# Patient Record
Sex: Female | Born: 1965
Health system: Southern US, Community
[De-identification: ages and names within clinical notes are randomized; demographics above are authoritative.]

## PROBLEM LIST (undated history)

## (undated) DIAGNOSIS — D72829 Elevated white blood cell count, unspecified: Secondary | ICD-10-CM

## (undated) DIAGNOSIS — M549 Dorsalgia, unspecified: Secondary | ICD-10-CM

## (undated) DIAGNOSIS — J45909 Unspecified asthma, uncomplicated: Secondary | ICD-10-CM

## (undated) DIAGNOSIS — F32A Depression, unspecified: Secondary | ICD-10-CM

## (undated) DIAGNOSIS — F329 Major depressive disorder, single episode, unspecified: Secondary | ICD-10-CM

## (undated) DIAGNOSIS — E039 Hypothyroidism, unspecified: Secondary | ICD-10-CM

## (undated) DIAGNOSIS — F419 Anxiety disorder, unspecified: Secondary | ICD-10-CM

## (undated) DIAGNOSIS — N879 Dysplasia of cervix uteri, unspecified: Secondary | ICD-10-CM

## (undated) DIAGNOSIS — K219 Gastro-esophageal reflux disease without esophagitis: Secondary | ICD-10-CM

## (undated) DIAGNOSIS — R87619 Unspecified abnormal cytological findings in specimens from cervix uteri: Secondary | ICD-10-CM

## (undated) HISTORY — DX: Depression, unspecified: F32.A

## (undated) HISTORY — PX: GYNECOLOGIC CRYOSURGERY: SHX857

## (undated) HISTORY — DX: Elevated white blood cell count, unspecified: D72.829

## (undated) HISTORY — DX: Unspecified abnormal cytological findings in specimens from cervix uteri: R87.619

## (undated) HISTORY — PX: COLPOSCOPY: SHX161

## (undated) HISTORY — DX: Dorsalgia, unspecified: M54.9

## (undated) HISTORY — DX: Gastro-esophageal reflux disease without esophagitis: K21.9

## (undated) HISTORY — DX: Anxiety disorder, unspecified: F41.9

## (undated) HISTORY — DX: Unspecified asthma, uncomplicated: J45.909

## (undated) HISTORY — DX: Dysplasia of cervix uteri, unspecified: N87.9

## (undated) HISTORY — DX: Hypothyroidism, unspecified: E03.9

## (undated) HISTORY — DX: Major depressive disorder, single episode, unspecified: F32.9

---

## 1999-08-05 ENCOUNTER — Other Ambulatory Visit: Admission: RE | Admit: 1999-08-05 | Discharge: 1999-08-05 | Payer: Self-pay | Admitting: *Deleted

## 2002-06-07 ENCOUNTER — Encounter: Payer: Self-pay | Admitting: Occupational Medicine

## 2002-06-07 ENCOUNTER — Encounter: Admission: RE | Admit: 2002-06-07 | Discharge: 2002-06-07 | Payer: Self-pay | Admitting: Occupational Medicine

## 2002-12-01 ENCOUNTER — Other Ambulatory Visit: Admission: RE | Admit: 2002-12-01 | Discharge: 2002-12-01 | Payer: Self-pay | Admitting: Oncology

## 2002-12-05 ENCOUNTER — Ambulatory Visit (HOSPITAL_COMMUNITY): Admission: RE | Admit: 2002-12-05 | Discharge: 2002-12-05 | Payer: Self-pay | Admitting: Oncology

## 2002-12-05 ENCOUNTER — Encounter: Payer: Self-pay | Admitting: Oncology

## 2003-11-12 ENCOUNTER — Other Ambulatory Visit: Admission: RE | Admit: 2003-11-12 | Discharge: 2003-11-12 | Payer: Self-pay | Admitting: *Deleted

## 2003-11-14 ENCOUNTER — Encounter: Admission: RE | Admit: 2003-11-14 | Discharge: 2003-11-14 | Payer: Self-pay | Admitting: Family Medicine

## 2004-07-30 ENCOUNTER — Ambulatory Visit: Payer: Self-pay | Admitting: Oncology

## 2005-10-07 ENCOUNTER — Other Ambulatory Visit: Admission: RE | Admit: 2005-10-07 | Discharge: 2005-10-07 | Payer: Self-pay | Admitting: *Deleted

## 2006-11-16 ENCOUNTER — Other Ambulatory Visit: Admission: RE | Admit: 2006-11-16 | Discharge: 2006-11-16 | Payer: Self-pay | Admitting: *Deleted

## 2008-10-03 ENCOUNTER — Other Ambulatory Visit: Admission: RE | Admit: 2008-10-03 | Discharge: 2008-10-03 | Payer: Self-pay | Admitting: Family Medicine

## 2008-10-16 ENCOUNTER — Encounter: Admission: RE | Admit: 2008-10-16 | Discharge: 2008-10-16 | Payer: Self-pay | Admitting: Family Medicine

## 2009-10-28 ENCOUNTER — Encounter: Admission: RE | Admit: 2009-10-28 | Discharge: 2009-10-28 | Payer: Self-pay | Admitting: Family Medicine

## 2009-11-26 ENCOUNTER — Other Ambulatory Visit: Admission: RE | Admit: 2009-11-26 | Discharge: 2009-11-26 | Payer: Self-pay | Admitting: Family Medicine

## 2010-12-30 ENCOUNTER — Other Ambulatory Visit: Payer: Self-pay | Admitting: Family Medicine

## 2010-12-30 DIAGNOSIS — Z1231 Encounter for screening mammogram for malignant neoplasm of breast: Secondary | ICD-10-CM

## 2011-01-05 ENCOUNTER — Ambulatory Visit
Admission: RE | Admit: 2011-01-05 | Discharge: 2011-01-05 | Disposition: A | Discharge status: Home / Self Care | Payer: BC Managed Care – PPO | Source: Ambulatory Visit | Attending: Family Medicine | Admitting: Family Medicine

## 2011-01-05 DIAGNOSIS — Z1231 Encounter for screening mammogram for malignant neoplasm of breast: Secondary | ICD-10-CM

## 2011-01-08 ENCOUNTER — Other Ambulatory Visit: Payer: Self-pay | Admitting: Women's Health

## 2011-01-08 ENCOUNTER — Other Ambulatory Visit (HOSPITAL_COMMUNITY)
Admission: RE | Admit: 2011-01-08 | Discharge: 2011-01-08 | Disposition: A | Discharge status: Home / Self Care | Payer: BC Managed Care – PPO | Source: Ambulatory Visit | Attending: Gynecology | Admitting: Gynecology

## 2011-01-08 ENCOUNTER — Ambulatory Visit (INDEPENDENT_AMBULATORY_CARE_PROVIDER_SITE_OTHER): Payer: BC Managed Care – PPO | Admitting: Women's Health

## 2011-01-08 DIAGNOSIS — R823 Hemoglobinuria: Secondary | ICD-10-CM

## 2011-01-08 DIAGNOSIS — Z01419 Encounter for gynecological examination (general) (routine) without abnormal findings: Secondary | ICD-10-CM

## 2011-01-08 DIAGNOSIS — N920 Excessive and frequent menstruation with regular cycle: Secondary | ICD-10-CM

## 2011-01-08 DIAGNOSIS — Z124 Encounter for screening for malignant neoplasm of cervix: Secondary | ICD-10-CM | POA: Insufficient documentation

## 2011-01-08 DIAGNOSIS — Z833 Family history of diabetes mellitus: Secondary | ICD-10-CM

## 2011-01-08 DIAGNOSIS — E039 Hypothyroidism, unspecified: Secondary | ICD-10-CM

## 2011-02-02 ENCOUNTER — Ambulatory Visit: Payer: BC Managed Care – PPO | Admitting: Gynecology

## 2011-02-02 ENCOUNTER — Other Ambulatory Visit: Payer: BC Managed Care – PPO

## 2011-04-16 ENCOUNTER — Encounter: Payer: Self-pay | Admitting: *Deleted

## 2011-04-17 ENCOUNTER — Ambulatory Visit (INDEPENDENT_AMBULATORY_CARE_PROVIDER_SITE_OTHER): Payer: BC Managed Care – PPO | Admitting: Women's Health

## 2011-04-17 ENCOUNTER — Encounter: Payer: Self-pay | Admitting: Women's Health

## 2011-04-17 DIAGNOSIS — E039 Hypothyroidism, unspecified: Secondary | ICD-10-CM

## 2011-04-17 DIAGNOSIS — D691 Qualitative platelet defects: Secondary | ICD-10-CM

## 2011-04-17 MED ORDER — SYNTHROID 100 MCG PO TABS: 100.0000 ug | ORAL_TABLET | Freq: Every day | ORAL | Status: DC

## 2011-04-17 NOTE — Progress Notes (Signed)
  Had annual exam in April of 2012 as a new patient. At that office visit she was on Synthroid 0.88 mg her TSH at the office this it was 7.5 to. She was increased to Synthroid 100 mcg by mouth daily she presents today for followup TSH. Has a known history of an increased WBC she is seen a hematologist and she had a negative workup for that problem she states her platelets were also slightly elevated always. Platelets at her annual exam or 460s one we'll also check a CBC today to check stability. Her periods are regular no bleeding between and much lighter since she has been on the Loestrin 1/20. We'll triage based on results of her labs.

## 2011-04-22 ENCOUNTER — Telehealth: Payer: Self-pay | Admitting: Women's Health

## 2011-04-22 DIAGNOSIS — E039 Hypothyroidism, unspecified: Secondary | ICD-10-CM

## 2011-04-22 MED ORDER — SYNTHROID 112 MCG PO TABS: 112.0000 ug | ORAL_TABLET | Freq: Every day | ORAL | Status: DC

## 2011-04-22 NOTE — Telephone Encounter (Signed)
Telephone call to patient to review TSH. TSH is 5.48 she is currently on Synthroid 100 mcg by mouth daily. Will increase to 112 mcg by mouth daily recheck in 2-1/2 months. Platelets are also 465 which is stable from her last platelet count. She has had hematology workup in the past when she had  elevated platelets and increased WBC, which is 9, was 9.4 at annual..

## 2011-07-27 ENCOUNTER — Other Ambulatory Visit: Payer: Self-pay | Admitting: Women's Health

## 2011-07-27 NOTE — Telephone Encounter (Signed)
Amy please call in Synthroid for patient and have her get TSH drawn.

## 2011-07-27 NOTE — Telephone Encounter (Signed)
Refill received... Patient was to recheck TSH, but has not come yet.

## 2011-07-28 NOTE — Telephone Encounter (Signed)
Lm on patient's vm to come in for TSH check.  Refill sent in.

## 2011-08-28 ENCOUNTER — Ambulatory Visit (INDEPENDENT_AMBULATORY_CARE_PROVIDER_SITE_OTHER): Payer: BC Managed Care – PPO | Admitting: Gynecology

## 2011-08-28 DIAGNOSIS — E039 Hypothyroidism, unspecified: Secondary | ICD-10-CM

## 2011-09-02 ENCOUNTER — Telehealth: Payer: Self-pay | Admitting: Women's Health

## 2011-09-02 DIAGNOSIS — E039 Hypothyroidism, unspecified: Secondary | ICD-10-CM

## 2011-09-02 MED ORDER — SYNTHROID 112 MCG PO TABS: 112.0000 ug | ORAL_TABLET | Freq: Every day | ORAL | Status: DC

## 2011-09-02 NOTE — Telephone Encounter (Signed)
TC informed TSH normal continue same dose 112 Snythroid

## 2011-11-09 ENCOUNTER — Other Ambulatory Visit: Payer: Self-pay | Admitting: Women's Health

## 2011-12-10 ENCOUNTER — Other Ambulatory Visit: Payer: Self-pay | Admitting: Family Medicine

## 2011-12-10 DIAGNOSIS — Z1231 Encounter for screening mammogram for malignant neoplasm of breast: Secondary | ICD-10-CM

## 2012-01-07 ENCOUNTER — Ambulatory Visit
Admission: RE | Admit: 2012-01-07 | Discharge: 2012-01-07 | Disposition: A | Discharge status: Home / Self Care | Payer: BC Managed Care – PPO | Source: Ambulatory Visit | Attending: Family Medicine | Admitting: Family Medicine

## 2012-01-07 DIAGNOSIS — Z1231 Encounter for screening mammogram for malignant neoplasm of breast: Secondary | ICD-10-CM

## 2012-01-11 ENCOUNTER — Ambulatory Visit (INDEPENDENT_AMBULATORY_CARE_PROVIDER_SITE_OTHER): Payer: BC Managed Care – PPO | Admitting: Women's Health

## 2012-01-11 ENCOUNTER — Other Ambulatory Visit (HOSPITAL_COMMUNITY)
Admission: RE | Admit: 2012-01-11 | Discharge: 2012-01-11 | Disposition: A | Discharge status: Home / Self Care | Payer: BC Managed Care – PPO | Source: Ambulatory Visit | Attending: Obstetrics and Gynecology | Admitting: Obstetrics and Gynecology

## 2012-01-11 ENCOUNTER — Other Ambulatory Visit: Payer: Self-pay | Admitting: Family Medicine

## 2012-01-11 ENCOUNTER — Encounter: Payer: Self-pay | Admitting: Women's Health

## 2012-01-11 VITALS — BP 128/76 | Ht 61.0 in | Wt 211.0 lb

## 2012-01-11 DIAGNOSIS — Z833 Family history of diabetes mellitus: Secondary | ICD-10-CM

## 2012-01-11 DIAGNOSIS — E039 Hypothyroidism, unspecified: Secondary | ICD-10-CM

## 2012-01-11 DIAGNOSIS — E669 Obesity, unspecified: Secondary | ICD-10-CM

## 2012-01-11 DIAGNOSIS — Z13220 Encounter for screening for lipoid disorders: Secondary | ICD-10-CM

## 2012-01-11 DIAGNOSIS — R928 Other abnormal and inconclusive findings on diagnostic imaging of breast: Secondary | ICD-10-CM

## 2012-01-11 DIAGNOSIS — Z309 Encounter for contraceptive management, unspecified: Secondary | ICD-10-CM

## 2012-01-11 DIAGNOSIS — IMO0001 Reserved for inherently not codable concepts without codable children: Secondary | ICD-10-CM

## 2012-01-11 DIAGNOSIS — D72829 Elevated white blood cell count, unspecified: Secondary | ICD-10-CM | POA: Insufficient documentation

## 2012-01-11 DIAGNOSIS — Z01419 Encounter for gynecological examination (general) (routine) without abnormal findings: Secondary | ICD-10-CM | POA: Insufficient documentation

## 2012-01-11 LAB — CBC WITH DIFFERENTIAL/PLATELET
Basophils Absolute: 0 K/uL (ref 0.0–0.1)
Basophils Relative: 0 % (ref 0–1)
Eosinophils Absolute: 0.2 K/uL (ref 0.0–0.7)
Eosinophils Relative: 2 % (ref 0–5)
HCT: 40.4 % (ref 36.0–46.0)
Hemoglobin: 12.7 g/dL (ref 12.0–15.0)
Lymphocytes Relative: 28 % (ref 12–46)
Lymphs Abs: 3.3 K/uL (ref 0.7–4.0)
MCH: 27.6 pg (ref 26.0–34.0)
MCHC: 31.4 g/dL (ref 30.0–36.0)
MCV: 87.8 fL (ref 78.0–100.0)
Monocytes Absolute: 0.5 K/uL (ref 0.1–1.0)
Monocytes Relative: 4 % (ref 3–12)
Neutro Abs: 7.9 K/uL — ABNORMAL HIGH (ref 1.7–7.7)
Neutrophils Relative %: 66 % (ref 43–77)
Platelets: 453 K/uL — ABNORMAL HIGH (ref 150–400)
RBC: 4.6 MIL/uL (ref 3.87–5.11)
RDW: 14.9 % (ref 11.5–15.5)
WBC: 11.9 K/uL — ABNORMAL HIGH (ref 4.0–10.5)

## 2012-01-11 LAB — LIPID PANEL
LDL Cholesterol: 127 mg/dL — ABNORMAL HIGH (ref 0–99)
Total CHOL/HDL Ratio: 4.3 Ratio

## 2012-01-11 LAB — GLUCOSE, RANDOM: Glucose, Bld: 98 mg/dL (ref 70–99)

## 2012-01-11 LAB — TSH: TSH: 4.15 u[IU]/mL (ref 0.350–4.500)

## 2012-01-11 MED ORDER — SYNTHROID 112 MCG PO TABS: 112.0000 ug | ORAL_TABLET | Freq: Every day | ORAL | Status: DC

## 2012-01-11 MED ORDER — NORETHIN ACE-ETH ESTRAD-FE 1-20 MG-MCG PO TABS: 1.0000 | ORAL_TABLET | Freq: Every day | ORAL | Status: DC

## 2012-01-11 NOTE — Patient Instructions (Signed)

## 2012-01-11 NOTE — Progress Notes (Signed)
Tina Moreno 02/15/1966 [id number]    History:    The patient presents for annual exam.  Monthly 4-5 day cycle/Loestrin 1/20/same partner. History of normal Paps per patient. Had a mammogram on 01/08/12 needs additional views, will schedule. History of anxiety and depression currently on no medications. Hypothyroid on Synthroid 112 g daily. Currently having some indigestion, heartburn, loose stools without fever, weight loss or visible blood for approximately one month. Complained of seasonal allergy symptoms.   Past medical history, past surgical history, family history and social history were all reviewed and documented in the EPIC chart. Had one son age 58 now/adopted out as a baby.  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 History of elevated  wbc's with a negative workup by hematologist.   ROS:  A  ROS was performed and pertinent positives and negatives are included in the history.  Exam:  Filed Vitals:   01/11/12 0906  BP: 128/76    General appearance:  Normal Head/Neck:  Normal, without cervical or supraclavicular adenopathy. Thyroid:  Symmetrical, normal in size, without palpable masses or nodularity. Respiratory  Effort:  Normal  Auscultation:  Clear without wheezing or rhonchi Cardiovascular  Auscultation:  Regular rate, without rubs, murmurs or gallops  Edema/varicosities:  Not grossly evident Abdominal  Soft,nontender, without masses, guarding or rebound.  Liver/spleen:  No organomegaly noted  Hernia:  None appreciated  Skin  Inspection:  Grossly normal  Palpation:  Grossly normal Neurologic/psychiatric  Orientation:  Normal with appropriate conversation.  Mood/affect:  Normal  Genitourinary    Breasts: Examined lying and sitting.     Right: Without masses, retractions, discharge or axillary adenopathy.     Left: Without masses, retractions, discharge or axillary adenopathy.   Inguinal/mons:  Normal without inguinal adenopathy  External genitalia:   Normal  BUS/Urethra/Skene's glands:  Normal  Bladder:  Normal  Vagina:  Normal  Cervix:  Normal  Uterus:   normal in size, shape and contour.  Midline and mobile  Adnexa/parametria:     Rt: Without masses or tenderness.   Lt: Without masses or tenderness.  Anus and perineum: Normal  Digital rectal exam: Normal sphincter tone without palpated masses or tenderness  Assessment/Plan:  46 y.o. SWF G1P1 for annual exam with complaint of digestive issues/change for 1 month.  GI changes-loose stools without fever, weight loss, blood in stools, or nausea/vomiting Normal GYN exam on Loestrin 1/20 Hypothyroid on 112 mcg Synthroid daily Obesity History of anxiety/depression/currently on no medications.  Plan: CBC, TSH, glucose, lipid profile, UA and Pap. SBE's, schedule followup mammogram, calcium rich diet, bland diet, no dairy products until loose stools resolve. Probiotic daily encouraged. If GI symptoms persist instructed to followup with GI doctor. Synthroid 112 mcg, prescription, proper use reviewed. Loestrin 1/20 prescription, proper use given and reviewed slight risk for blood clots and strokes. Reviewed importance of increasing exercise, decreasing calories for weight loss. Declines need for counseling at this time.   Harrington Challenger [facility], 10:27 AM 01/11/2012

## 2012-01-12 LAB — URINALYSIS W MICROSCOPIC + REFLEX CULTURE
Glucose, UA: NEGATIVE mg/dL
Leukocytes, UA: NEGATIVE
Nitrite: NEGATIVE
Protein, ur: NEGATIVE mg/dL
Urobilinogen, UA: 0.2 mg/dL (ref 0.0–1.0)

## 2012-01-13 ENCOUNTER — Other Ambulatory Visit: Payer: Self-pay | Admitting: Women's Health

## 2012-01-13 DIAGNOSIS — N390 Urinary tract infection, site not specified: Secondary | ICD-10-CM

## 2012-01-13 DIAGNOSIS — D691 Qualitative platelet defects: Secondary | ICD-10-CM

## 2012-01-13 MED ORDER — NITROFURANTOIN MONOHYD MACRO 100 MG PO CAPS: 100.0000 mg | ORAL_CAPSULE | Freq: Two times a day (BID) | ORAL | Status: AC

## 2012-01-14 ENCOUNTER — Ambulatory Visit
Admission: RE | Admit: 2012-01-14 | Discharge: 2012-01-14 | Disposition: A | Discharge status: Home / Self Care | Payer: BC Managed Care – PPO | Source: Ambulatory Visit | Attending: Family Medicine | Admitting: Family Medicine

## 2012-01-14 ENCOUNTER — Other Ambulatory Visit: Payer: Self-pay | Admitting: Women's Health

## 2012-01-14 DIAGNOSIS — R928 Other abnormal and inconclusive findings on diagnostic imaging of breast: Secondary | ICD-10-CM

## 2012-01-14 DIAGNOSIS — E039 Hypothyroidism, unspecified: Secondary | ICD-10-CM

## 2012-01-14 LAB — URINE CULTURE: Colony Count: 75000

## 2012-01-14 MED ORDER — SYNTHROID 125 MCG PO TABS: 125.0000 ug | ORAL_TABLET | Freq: Every day | ORAL | Status: DC

## 2012-02-01 ENCOUNTER — Other Ambulatory Visit: Payer: BC Managed Care – PPO | Admitting: Women's Health

## 2012-02-03 ENCOUNTER — Other Ambulatory Visit: Payer: Self-pay | Admitting: Women's Health

## 2012-02-03 DIAGNOSIS — E039 Hypothyroidism, unspecified: Secondary | ICD-10-CM

## 2012-03-16 ENCOUNTER — Other Ambulatory Visit: Payer: BC Managed Care – PPO | Admitting: Women's Health

## 2012-03-16 DIAGNOSIS — N390 Urinary tract infection, site not specified: Secondary | ICD-10-CM

## 2012-03-16 DIAGNOSIS — D691 Qualitative platelet defects: Secondary | ICD-10-CM

## 2012-03-16 DIAGNOSIS — E039 Hypothyroidism, unspecified: Secondary | ICD-10-CM

## 2012-03-16 LAB — CBC WITH DIFFERENTIAL/PLATELET
Hemoglobin: 12.3 g/dL (ref 12.0–15.0)
Lymphocytes Relative: 28 % (ref 12–46)
Lymphs Abs: 3.2 10*3/uL (ref 0.7–4.0)
Monocytes Relative: 5 % (ref 3–12)
Neutro Abs: 7.4 10*3/uL (ref 1.7–7.7)
Neutrophils Relative %: 65 % (ref 43–77)
Platelets: 441 10*3/uL — ABNORMAL HIGH (ref 150–400)
RBC: 4.52 MIL/uL (ref 3.87–5.11)
WBC: 11.5 10*3/uL — ABNORMAL HIGH (ref 4.0–10.5)

## 2012-03-16 LAB — TSH: TSH: 2.41 u[IU]/mL (ref 0.350–4.500)

## 2012-03-17 ENCOUNTER — Other Ambulatory Visit: Payer: Self-pay | Admitting: Women's Health

## 2012-03-17 DIAGNOSIS — E039 Hypothyroidism, unspecified: Secondary | ICD-10-CM

## 2012-03-17 LAB — URINALYSIS W MICROSCOPIC + REFLEX CULTURE
Casts: NONE SEEN
Glucose, UA: NEGATIVE mg/dL
Leukocytes, UA: NEGATIVE
Nitrite: NEGATIVE
pH: 5.5 (ref 5.0–8.0)

## 2012-03-17 MED ORDER — SYNTHROID 125 MCG PO TABS: 125.0000 ug | ORAL_TABLET | Freq: Every day | ORAL | Status: DC

## 2012-07-24 ENCOUNTER — Other Ambulatory Visit: Payer: Self-pay | Admitting: Women's Health

## 2012-12-13 ENCOUNTER — Other Ambulatory Visit: Payer: Self-pay

## 2012-12-13 DIAGNOSIS — Z1231 Encounter for screening mammogram for malignant neoplasm of breast: Secondary | ICD-10-CM

## 2013-01-11 ENCOUNTER — Ambulatory Visit (INDEPENDENT_AMBULATORY_CARE_PROVIDER_SITE_OTHER): Payer: BC Managed Care – PPO | Admitting: Women's Health

## 2013-01-11 ENCOUNTER — Encounter: Payer: Self-pay | Admitting: Women's Health

## 2013-01-11 ENCOUNTER — Other Ambulatory Visit (HOSPITAL_COMMUNITY)
Admission: RE | Admit: 2013-01-11 | Discharge: 2013-01-11 | Disposition: A | Discharge status: Home / Self Care | Payer: BC Managed Care – PPO | Source: Ambulatory Visit | Attending: Obstetrics and Gynecology | Admitting: Obstetrics and Gynecology

## 2013-01-11 ENCOUNTER — Ambulatory Visit
Admission: RE | Admit: 2013-01-11 | Discharge: 2013-01-11 | Disposition: A | Discharge status: Home / Self Care | Payer: BC Managed Care – PPO | Source: Ambulatory Visit

## 2013-01-11 VITALS — BP 120/78 | Ht 61.0 in | Wt 192.0 lb

## 2013-01-11 DIAGNOSIS — E039 Hypothyroidism, unspecified: Secondary | ICD-10-CM

## 2013-01-11 DIAGNOSIS — F411 Generalized anxiety disorder: Secondary | ICD-10-CM

## 2013-01-11 DIAGNOSIS — Z1231 Encounter for screening mammogram for malignant neoplasm of breast: Secondary | ICD-10-CM

## 2013-01-11 DIAGNOSIS — Z13220 Encounter for screening for lipoid disorders: Secondary | ICD-10-CM

## 2013-01-11 DIAGNOSIS — Z309 Encounter for contraceptive management, unspecified: Secondary | ICD-10-CM

## 2013-01-11 DIAGNOSIS — IMO0001 Reserved for inherently not codable concepts without codable children: Secondary | ICD-10-CM

## 2013-01-11 DIAGNOSIS — M791 Myalgia, unspecified site: Secondary | ICD-10-CM

## 2013-01-11 DIAGNOSIS — M549 Dorsalgia, unspecified: Secondary | ICD-10-CM | POA: Insufficient documentation

## 2013-01-11 DIAGNOSIS — Z01419 Encounter for gynecological examination (general) (routine) without abnormal findings: Secondary | ICD-10-CM

## 2013-01-11 DIAGNOSIS — Z833 Family history of diabetes mellitus: Secondary | ICD-10-CM

## 2013-01-11 LAB — CBC WITH DIFFERENTIAL/PLATELET
Basophils Relative: 0 % (ref 0–1)
Eosinophils Absolute: 0.2 10*3/uL (ref 0.0–0.7)
HCT: 40.7 % (ref 36.0–46.0)
Hemoglobin: 13.5 g/dL (ref 12.0–15.0)
MCH: 27.2 pg (ref 26.0–34.0)
MCHC: 33.2 g/dL (ref 30.0–36.0)
MCV: 82.1 fL (ref 78.0–100.0)
Monocytes Absolute: 0.8 10*3/uL (ref 0.1–1.0)
Monocytes Relative: 6 % (ref 3–12)
Neutro Abs: 8.5 10*3/uL — ABNORMAL HIGH (ref 1.7–7.7)

## 2013-01-11 LAB — LIPID PANEL
Cholesterol: 199 mg/dL (ref 0–200)
Total CHOL/HDL Ratio: 4.3 Ratio
VLDL: 21 mg/dL (ref 0–40)

## 2013-01-11 MED ORDER — LEVONORGESTREL-ETHINYL ESTRAD 0.1-20 MG-MCG PO TABS: 1.0000 | ORAL_TABLET | Freq: Every day | ORAL | Status: DC

## 2013-01-11 MED ORDER — CARISOPRODOL 350 MG PO TABS: 350.0000 mg | ORAL_TABLET | Freq: Three times a day (TID) | ORAL | Status: DC | PRN

## 2013-01-11 MED ORDER — SYNTHROID 125 MCG PO TABS: 125.0000 ug | ORAL_TABLET | Freq: Every day | ORAL | Status: DC

## 2013-01-11 MED ORDER — CLONAZEPAM 0.5 MG PO TABS: 0.5000 mg | ORAL_TABLET | Freq: Two times a day (BID) | ORAL | Status: DC | PRN

## 2013-01-11 NOTE — Patient Instructions (Addendum)

## 2013-01-11 NOTE — Progress Notes (Signed)
Tina Moreno 1965-12-21 [id number]    History:    The patient presents for annual exam.  Monthly cycle on Loestrin. States has had problems with birth control pills with GI symptoms of alternating diarrhea and constipation with nausea. History of cryo- for an abnormal Pap in her 19s, normal Paps for about 20 years. Normal mammograms. History of elevated WBCs with negative hematology workup. Hypothyroid on 125 Synthroid.   Past medical history, past surgical history, family history and social history were all reviewed and documented in the EPIC chart. 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 History- son who was adopted out as a Development worker, international aid.   ROS:  A  ROS was performed and pertinent positives and negatives are included in the history.  Exam:  Filed Vitals:   01/11/13 1201  BP: 120/78    General appearance:  Normal Head/Neck:  Normal, without cervical or supraclavicular adenopathy. Thyroid:  Symmetrical, normal in size, without palpable masses or nodularity. Respiratory  Effort:  Normal  Auscultation:  Clear without wheezing or rhonchi Cardiovascular  Auscultation:  Regular rate, without rubs, murmurs or gallops  Edema/varicosities:  Not grossly evident Abdominal  Soft,nontender, without masses, guarding or rebound.  Liver/spleen:  No organomegaly noted  Hernia:  None appreciated  Skin  Inspection:  Grossly normal  Palpation:  Grossly normal Neurologic/psychiatric  Orientation:  Normal with appropriate conversation.  Mood/affect:  Normal  Genitourinary    Breasts: Examined lying and sitting.     Right: Without masses, retractions, discharge or axillary adenopathy.     Left: Without masses, retractions, discharge or axillary adenopathy.   Inguinal/mons:  Normal without inguinal adenopathy  External genitalia:  Normal  BUS/Urethra/Skene's glands:  Normal  Bladder:  Normal  Vagina:  Normal  Cervix:  Normal  Uterus:  normal in size, shape and contour.  Midline and  mobile  Adnexa/parametria:     Rt: Without masses or tenderness.   Lt: Without masses or tenderness.  Anus and perineum: Normal  Digital rectal exam: Normal sphincter tone without palpated masses or tenderness  Assessment/Plan:  47 y.o. S. WF G1 P1( adopted) for annual exam.   Hypothyroid-Synthroid 125 mcg Anxiety/depression-occasional Klonopin Back pain-occasional soma Loestrin 1/20-GI complaints Obesity Cryo- normal Paps for greater than 10 years.  Plan: CBC, glucose, lipid panel, TSH, UA, Pap. SBE's, continue annual mammogram, calcium rich diet, increase exercise and decrease calories for weight loss encouraged. Contraception reviewed, will try Alesse, prescription, proper use, slight risk for blood clots, strokes, reviewed. Mirena IUD information given and reviewed slight risk forhemorrhage, infection, perforation, reviewed to be placed with cycle. Synthroid 125 mcg by mouth daily, prescription, proper use given and reviewed. Declines need for counseling at this time, states has used one prescription of Klonopin 0.5 mg #30 this past year. Refill given. Prescription for soma 350 when necessary for back pain given states has used one prescription this past year.      Harrington Challenger [facility], 12:47 PM 01/11/2013

## 2013-01-12 LAB — URINALYSIS W MICROSCOPIC + REFLEX CULTURE
Crystals: NONE SEEN
Nitrite: NEGATIVE
Specific Gravity, Urine: 1.023 (ref 1.005–1.030)
Urobilinogen, UA: 0.2 mg/dL (ref 0.0–1.0)

## 2013-07-27 ENCOUNTER — Other Ambulatory Visit: Payer: Self-pay

## 2014-01-19 ENCOUNTER — Other Ambulatory Visit: Payer: Self-pay | Admitting: Women's Health

## 2014-02-08 ENCOUNTER — Encounter: Payer: BC Managed Care – PPO | Admitting: Women's Health

## 2014-02-23 ENCOUNTER — Ambulatory Visit (INDEPENDENT_AMBULATORY_CARE_PROVIDER_SITE_OTHER): Payer: BC Managed Care – PPO | Admitting: Women's Health

## 2014-02-23 ENCOUNTER — Encounter: Payer: Self-pay | Admitting: Women's Health

## 2014-02-23 VITALS — BP 122/72 | Ht 60.5 in | Wt 207.0 lb

## 2014-02-23 DIAGNOSIS — Z309 Encounter for contraceptive management, unspecified: Secondary | ICD-10-CM

## 2014-02-23 DIAGNOSIS — IMO0001 Reserved for inherently not codable concepts without codable children: Secondary | ICD-10-CM

## 2014-02-23 DIAGNOSIS — Z13220 Encounter for screening for lipoid disorders: Secondary | ICD-10-CM

## 2014-02-23 DIAGNOSIS — E039 Hypothyroidism, unspecified: Secondary | ICD-10-CM

## 2014-02-23 DIAGNOSIS — Z01419 Encounter for gynecological examination (general) (routine) without abnormal findings: Secondary | ICD-10-CM

## 2014-02-23 LAB — COMPREHENSIVE METABOLIC PANEL
ALBUMIN: 3.8 g/dL (ref 3.5–5.2)
ALT: 10 U/L (ref 0–35)
AST: 15 U/L (ref 0–37)
Alkaline Phosphatase: 93 U/L (ref 39–117)
BUN: 13 mg/dL (ref 6–23)
CALCIUM: 8.8 mg/dL (ref 8.4–10.5)
CHLORIDE: 103 meq/L (ref 96–112)
CO2: 28 meq/L (ref 19–32)
Creat: 0.6 mg/dL (ref 0.50–1.10)
Glucose, Bld: 96 mg/dL (ref 70–99)
POTASSIUM: 4.1 meq/L (ref 3.5–5.3)
SODIUM: 137 meq/L (ref 135–145)
TOTAL PROTEIN: 6.6 g/dL (ref 6.0–8.3)
Total Bilirubin: 0.3 mg/dL (ref 0.2–1.2)

## 2014-02-23 LAB — LIPID PANEL
CHOLESTEROL: 191 mg/dL (ref 0–200)
HDL: 42 mg/dL (ref >39)
LDL Cholesterol: 127 mg/dL — ABNORMAL HIGH (ref 0–99)
Total CHOL/HDL Ratio: 4.5 Ratio
Triglycerides: 112 mg/dL (ref <150)
VLDL: 22 mg/dL (ref 0–40)

## 2014-02-23 LAB — CBC WITH DIFFERENTIAL/PLATELET
BASOS ABS: 0.1 10*3/uL (ref 0.0–0.1)
Basophils Relative: 1 % (ref 0–1)
EOS PCT: 2 % (ref 0–5)
Eosinophils Absolute: 0.2 10*3/uL (ref 0.0–0.7)
HCT: 38.8 % (ref 36.0–46.0)
Hemoglobin: 12.8 g/dL (ref 12.0–15.0)
LYMPHS ABS: 3.2 10*3/uL (ref 0.7–4.0)
LYMPHS PCT: 31 % (ref 12–46)
MCH: 26.4 pg (ref 26.0–34.0)
MCHC: 33 g/dL (ref 30.0–36.0)
MCV: 80 fL (ref 78.0–100.0)
Monocytes Absolute: 0.5 10*3/uL (ref 0.1–1.0)
Monocytes Relative: 5 % (ref 3–12)
NEUTROS ABS: 6.3 10*3/uL (ref 1.7–7.7)
NEUTROS PCT: 61 % (ref 43–77)
PLATELETS: 438 10*3/uL — AB (ref 150–400)
RBC: 4.85 MIL/uL (ref 3.87–5.11)
RDW: 15.4 % (ref 11.5–15.5)
WBC: 10.3 10*3/uL (ref 4.0–10.5)

## 2014-02-23 LAB — TSH: TSH: 3.306 u[IU]/mL (ref 0.350–4.500)

## 2014-02-23 MED ORDER — SYNTHROID 125 MCG PO TABS: ORAL_TABLET | ORAL | Status: DC

## 2014-02-23 MED ORDER — NORELGESTROMIN-ETH ESTRADIOL 150-35 MCG/24HR TD PTWK: 1.0000 | MEDICATED_PATCH | TRANSDERMAL | Status: DC

## 2014-02-23 NOTE — Progress Notes (Signed)
Tina Moreno Nov 21, 1965 [id number]    History:    Presents for annual exam.  Regular monthly heavy cycles/withdrawal/same partner. States had nausea with birth control pills. Normal mammogram history. Had cryo- in her early 46s and normal Paps after. Hypothyroid on Synthroid 125. History of elevated white blood cells with negative workup.  Past medical history, past surgical history, family history and social history were all reviewed and documented in the EPIC chart. Works at [employer]. Mother mental health problems. Father with Parkinson, diabetes, hypertension. Has 1 son who was adopted.  ROS:  A  12 point ROS was performed and pertinent positives and negatives are included.  Exam:  Filed Vitals:   02/23/14 0753  BP: 122/72    General appearance:  Normal Thyroid:  Symmetrical, normal in size, without palpable masses or nodularity. Respiratory  Auscultation:  Clear without wheezing or rhonchi Cardiovascular  Auscultation:  Regular rate, without rubs, murmurs or gallops  Edema/varicosities:  Not grossly evident Abdominal  Soft,nontender, without masses, guarding or rebound.  Liver/spleen:  No organomegaly noted  Hernia:  None appreciated  Skin  Inspection:  Grossly normal   Breasts: Examined lying and sitting.     Right: Without masses, retractions, discharge or axillary adenopathy.     Left: Without masses, retractions, discharge or axillary adenopathy. Gentitourinary   Inguinal/mons:  Normal without inguinal adenopathy  External genitalia:  Normal  BUS/Urethra/Skene's glands:  Normal  Vagina:  Normal  Cervix:  Normal  Uterus:   normal in size, shape and contour.  Midline and mobile  Adnexa/parametria:     Rt: Without masses or tenderness.   Lt: Without masses or tenderness.  Anus and perineum: Normal  Digital rectal exam: Normal sphincter tone without palpated masses or tenderness  Assessment/Plan:  48 y.o.SWF G1P1 (adopted)  for annual exam.    Normal  GYN exam with menorrhagia Hypothyroid Obesity  Plan: Contraception options reviewed, Ortho Evra patch, one patch weekly for 3 weeks, reviewed risk for blood clots and strokes, will start with next cycle, condoms or withdrawal until after  first month. SBE's, annual mammogram overdue instructed to schedule, calcium rich diet, vitamin D 1000 daily encouraged. Reviewed importance of increasing regular exercise and decreasing calories for weight loss, Synthroid 125 mcg by mouth daily proper use, proper administration reviewed, TSH pending. CBC, comprehensive metabolic panel, lipid panel, UA, Pap normal 2014, new screening guidelines reviewed.   Note: This dictation was prepared with Dragon/digital dictation.  Any transcriptional errors that result are unintentional. Huel Cote [facility], 8:20 AM 02/23/2014

## 2014-02-23 NOTE — Patient Instructions (Signed)

## 2014-02-24 LAB — URINALYSIS W MICROSCOPIC + REFLEX CULTURE
Bilirubin Urine: NEGATIVE
Casts: NONE SEEN
Crystals: NONE SEEN
GLUCOSE, UA: NEGATIVE mg/dL
HGB URINE DIPSTICK: NEGATIVE
KETONES UR: NEGATIVE mg/dL
LEUKOCYTES UA: NEGATIVE
Nitrite: NEGATIVE
PROTEIN: NEGATIVE mg/dL
Specific Gravity, Urine: 1.021 (ref 1.005–1.030)
Urobilinogen, UA: 0.2 mg/dL (ref 0.0–1.0)
pH: 6 (ref 5.0–8.0)

## 2014-02-25 LAB — URINE CULTURE

## 2014-02-26 ENCOUNTER — Encounter: Payer: Self-pay | Admitting: Women's Health

## 2014-05-04 ENCOUNTER — Encounter: Payer: Self-pay | Admitting: Women's Health

## 2014-05-04 ENCOUNTER — Ambulatory Visit (INDEPENDENT_AMBULATORY_CARE_PROVIDER_SITE_OTHER): Payer: BC Managed Care – PPO | Admitting: Women's Health

## 2014-05-04 DIAGNOSIS — N760 Acute vaginitis: Secondary | ICD-10-CM

## 2014-05-04 DIAGNOSIS — A499 Bacterial infection, unspecified: Secondary | ICD-10-CM

## 2014-05-04 DIAGNOSIS — B373 Candidiasis of vulva and vagina: Secondary | ICD-10-CM

## 2014-05-04 DIAGNOSIS — N898 Other specified noninflammatory disorders of vagina: Secondary | ICD-10-CM

## 2014-05-04 DIAGNOSIS — L293 Anogenital pruritus, unspecified: Secondary | ICD-10-CM

## 2014-05-04 DIAGNOSIS — B9689 Other specified bacterial agents as the cause of diseases classified elsewhere: Secondary | ICD-10-CM

## 2014-05-04 DIAGNOSIS — B3731 Acute candidiasis of vulva and vagina: Secondary | ICD-10-CM

## 2014-05-04 LAB — WET PREP FOR TRICH, YEAST, CLUE: Trich, Wet Prep: NONE SEEN

## 2014-05-04 MED ORDER — METRONIDAZOLE 0.75 % VA GEL: 1.0000 | Freq: Two times a day (BID) | VAGINAL | Status: DC

## 2014-05-04 MED ORDER — FLUCONAZOLE 150 MG PO TABS: ORAL_TABLET | ORAL | Status: DC

## 2014-05-04 NOTE — Patient Instructions (Signed)
Bacterial Vaginosis Bacterial vaginosis is an infection of the vagina. It happens when too many of certain germs (bacteria) grow in the vagina. HOME CARE  Take your medicine as told by your doctor.  Finish your medicine even if you start to feel better.  Do not have sex until you finish your medicine and are better.  Tell your sex partner that you have an infection. They should see their doctor for treatment.  Practice safe sex. Use condoms. Have only one sex partner. GET HELP IF:  You are not getting better after 3 days of treatment.  You have more grey fluid (discharge) coming from your vagina than before.  You have more pain than before.  You have a fever. MAKE SURE YOU:   Understand these instructions.  Will watch your condition.  Will get help right away if you are not doing well or get worse. Document Released: 06/16/2008 Document Revised: 06/28/2013 Document Reviewed: 04/19/2013 ExitCare Patient Information 2015 [employer]. This information is not intended to replace advice given to you by your health care provider. Make sure you discuss any questions you have with your health care provider. Monilial Vaginitis Vaginitis in a soreness, swelling and redness (inflammation) of the vagina and vulva. Monilial vaginitis is not a sexually transmitted infection. CAUSES  Yeast vaginitis is caused by yeast (candida) that is normally found in your vagina. With a yeast infection, the candida has overgrown in number to a point that upsets the chemical balance. SYMPTOMS   White, thick vaginal discharge.  Swelling, itching, redness and irritation of the vagina and possibly the lips of the vagina (vulva).  Burning or painful urination.  Painful intercourse. DIAGNOSIS  Things that may contribute to monilial vaginitis are:  Postmenopausal and virginal states.  Pregnancy.  Infections.  Being tired, sick or stressed, especially if you had monilial vaginitis in the  past.  Diabetes. Good control will help lower the chance.  Birth control pills.  Tight fitting garments.  Using bubble bath, feminine sprays, douches or deodorant tampons.  Taking certain medications that kill germs (antibiotics).  Sporadic recurrence can occur if you become ill. TREATMENT  Your caregiver will give you medication.  There are several kinds of anti monilial vaginal creams and suppositories specific for monilial vaginitis. For recurrent yeast infections, use a suppository or cream in the vagina 2 times a week, or as directed.  Anti-monilial or steroid cream for the itching or irritation of the vulva may also be used. Get your caregiver's permission.  Painting the vagina with methylene blue solution may help if the monilial cream does not work.  Eating yogurt may help prevent monilial vaginitis. HOME CARE INSTRUCTIONS   Finish all medication as prescribed.  Do not have sex until treatment is completed or after your caregiver tells you it is okay.  Take warm sitz baths.  Do not douche.  Do not use tampons, especially scented ones.  Wear cotton underwear.  Avoid tight pants and panty hose.  Tell your sexual partner that you have a yeast infection. They should go to their caregiver if they have symptoms such as mild rash or itching.  Your sexual partner should be treated as well if your infection is difficult to eliminate.  Practice safer sex. Use condoms.  Some vaginal medications cause latex condoms to fail. Vaginal medications that harm condoms are:  Cleocin cream.  Butoconazole (Femstat).  Terconazole (Terazol) vaginal suppository.  Miconazole (Monistat) (may be purchased over the counter). SEEK MEDICAL CARE IF:     You have a temperature by mouth above 102 F (38.9 C).  The infection is getting worse after 2 days of treatment.  The infection is not getting better after 3 days of treatment.  You develop blisters in or around your  vagina.  You develop vaginal bleeding, and it is not your menstrual period.  You have pain when you urinate.  You develop intestinal problems.  You have pain with sexual intercourse. Document Released: 06/17/2005 Document Revised: 11/30/2011 Document Reviewed: 03/01/2009 ExitCare Patient Information 2015 [employer]. This information is not intended to replace advice given to you by your health care provider. Make sure you discuss any questions you have with your health care provider.  

## 2014-05-04 NOTE — Progress Notes (Signed)
Patient ID: Tina Moreno, female   DOB: 07/14/1966, 48 y.o.   MRN: [id number] Presents with complaint of intense vaginal itching with irritation for 1 week.  Denies any urinary symptoms, abdominal pain or fever. Same partner, monthly cycle on Ortho Evra patch.   Exam: Appears well. External genitalia extremely erythematous, speculum exam scant white discharge with odor noted, wet prep positive for yeast, clues, and TNTC bacteria.  Yeast vaginitis BV  Plan: MetroGel vaginal cream 1 applicator at bedtime x5, alcohol precautions reviewed, Diflucan 1:50 PM today and repeat in 3 days prescription, proper use given and reviewed. Instructed to call if no relief. Yeast prevention discussed.

## 2014-07-06 ENCOUNTER — Other Ambulatory Visit: Payer: Self-pay

## 2014-07-23 ENCOUNTER — Encounter: Payer: Self-pay | Admitting: Women's Health

## 2014-08-11 ENCOUNTER — Other Ambulatory Visit: Payer: Self-pay | Admitting: Women's Health

## 2014-08-13 MED ORDER — CLONAZEPAM 0.5 MG PO TABS: 0.5000 mg | ORAL_TABLET | Freq: Two times a day (BID) | ORAL | Status: DC | PRN

## 2014-08-13 NOTE — Telephone Encounter (Signed)
Tina Moreno please see the below note.

## 2014-08-13 NOTE — Telephone Encounter (Signed)
Rx called in 

## 2015-03-07 ENCOUNTER — Other Ambulatory Visit: Payer: Self-pay | Admitting: Women's Health

## 2015-05-10 ENCOUNTER — Other Ambulatory Visit: Payer: Self-pay | Admitting: Women's Health

## 2015-10-30 ENCOUNTER — Encounter: Payer: Self-pay | Admitting: Obstetrics and Gynecology

## 2015-12-11 ENCOUNTER — Other Ambulatory Visit: Payer: Self-pay | Admitting: Obstetrics and Gynecology

## 2015-12-11 ENCOUNTER — Encounter: Payer: Self-pay | Admitting: Obstetrics and Gynecology

## 2015-12-11 ENCOUNTER — Ambulatory Visit (INDEPENDENT_AMBULATORY_CARE_PROVIDER_SITE_OTHER): Payer: BLUE CROSS/BLUE SHIELD | Admitting: Obstetrics and Gynecology

## 2015-12-11 VITALS — BP 106/78 | HR 88 | Ht 61.0 in | Wt 209.5 lb

## 2015-12-11 DIAGNOSIS — N920 Excessive and frequent menstruation with regular cycle: Secondary | ICD-10-CM

## 2015-12-11 DIAGNOSIS — E039 Hypothyroidism, unspecified: Secondary | ICD-10-CM | POA: Diagnosis not present

## 2015-12-11 DIAGNOSIS — Z01419 Encounter for gynecological examination (general) (routine) without abnormal findings: Secondary | ICD-10-CM

## 2015-12-11 NOTE — Progress Notes (Signed)
Subjective:   KATRI CREASMAN is a 50 y.o. G74P1001 Caucasian female here for a routine well-woman exam.  Patient's last menstrual period was 11/25/2015.    Current complaints: heavy menses monthly. PCP: needs new one       Does need labs  Social History: Sexual: heterosexual Marital Status: single Living situation: alone Occupation: data entry Tobacco/alcohol: no tobacco use Illicit drugs: no history of illicit drug use  The following portions of the patient's history were reviewed and updated as appropriate: allergies, current medications, past family history, past medical history, past social history, past surgical history and problem list.  Past Medical History Past Medical History  Diagnosis Date  . Elevated WBCs     NEGATIVE HEM W/U  . Back pain   . Cervical dysplasia   . Hypothyroid   . Anxiety   . Depression   . Abnormal Pap smear of cervix     Past Surgical History Past Surgical History  Procedure Laterality Date  . Colposcopy    . Gynecologic cryosurgery      Gynecologic History G1P1001  Patient's last menstrual period was 11/25/2015. Contraception: abstinence Last Pap: 2014. Results were: normal Last mammogram: 2015. Results were: normal                                                                                   Obstetric History OB History  Gravida Para Term Preterm AB SAB TAB Ectopic Multiple Living  1 1 1       1     # Outcome Date GA Lbr Len/2nd Weight Sex Delivery Anes PTL Lv  1 Term               Current Medications Current Outpatient Prescriptions on File Prior to Visit  Medication Sig Dispense Refill  . ALPRAZolam (XANAX) 0.5 MG tablet Take 0.5 mg by mouth as needed.      . carisoprodol (SOMA) 350 MG tablet Take 1 tablet (350 mg total) by mouth 3 (three) times daily as needed. 30 tablet 0  . clonazePAM (KLONOPIN) 0.5 MG tablet Take 1 tablet (0.5 mg total) by mouth 2 (two) times daily as needed. 30 tablet 0  . ibuprofen (ADVIL,MOTRIN)  200 MG tablet Take 200 mg by mouth every 6 (six) hours as needed.      . fluconazole (DIFLUCAN) 150 MG tablet Take one today and repeat in 3 days (Patient not taking: Reported on 12/11/2015) 2 tablet 1  . metroNIDAZOLE (METROGEL) 0.75 % vaginal gel Place 1 Applicatorful vaginally 2 (two) times daily. (Patient not taking: Reported on 12/11/2015) 70 g 0  . SYNTHROID 125 MCG tablet take 1 tablet by mouth once daily BRFORE BREAKFAST (Patient not taking: Reported on 12/11/2015) 30 tablet 0  . XULANE 150-35 MCG/24HR transdermal patch apply 1 patch and replace weekly for 3 weeks (Patient not taking: Reported on 12/11/2015) 1 Package 0   No current facility-administered medications on file prior to visit.    Review of Systems Patient denies any headaches, blurred vision, shortness of breath, chest pain, abdominal pain, problems with bowel movements, urination, or intercourse.  Objective:  BP 106/78 mmHg  Pulse 88  Ht 5\' 1"  (1.549  m)  Wt 209 lb 8 oz (95.029 kg)  BMI 39.61 kg/m2  LMP 11/25/2015 Physical Exam  General:  Well developed, well nourished, no acute distress. She is alert and oriented x3. Skin:  Warm and dry Neck:  Midline trachea, no thyromegaly or nodules Cardiovascular: Regular rate and rhythm, no murmur heard Lungs:  Effort normal, all lung fields clear to auscultation bilaterally Breasts:  No dominant palpable mass, retraction, or nipple discharge Abdomen:  Soft, non tender, no hepatosplenomegaly or masses Pelvic:  External genitalia is normal in appearance.  The vagina is normal in appearance. The cervix is bulbous, no CMT.  Thin prep pap is done with HR HPV cotesting. Uterus is felt to be normal size, shape, and contour.  No adnexal masses or tenderness noted. Extremities:  No swelling or varicosities noted Psych:  She has a normal mood and affect  Assessment:   Healthy well-woman exam Menorrhagia Obesity depression hypothyroidism  Plan:  Pap and labs obtained Referred to  PCP to establish care. F/U 1 year for AE, or sooner if needed Mammogram scheduled  Joanann Mies [place], CNM

## 2015-12-11 NOTE — Patient Instructions (Signed)
Place annual gynecologic exam patient instructions here.

## 2015-12-12 LAB — CYTOLOGY - PAP

## 2015-12-13 ENCOUNTER — Other Ambulatory Visit: Payer: Self-pay | Admitting: Obstetrics and Gynecology

## 2015-12-13 ENCOUNTER — Telehealth: Payer: Self-pay | Admitting: *Deleted

## 2015-12-13 DIAGNOSIS — R7303 Prediabetes: Secondary | ICD-10-CM

## 2015-12-13 DIAGNOSIS — E119 Type 2 diabetes mellitus without complications: Secondary | ICD-10-CM | POA: Insufficient documentation

## 2015-12-13 DIAGNOSIS — R7989 Other specified abnormal findings of blood chemistry: Secondary | ICD-10-CM

## 2015-12-13 DIAGNOSIS — E7800 Pure hypercholesterolemia, unspecified: Secondary | ICD-10-CM | POA: Insufficient documentation

## 2015-12-13 DIAGNOSIS — E559 Vitamin D deficiency, unspecified: Secondary | ICD-10-CM | POA: Insufficient documentation

## 2015-12-13 MED ORDER — VITAMIN D (ERGOCALCIFEROL) 1.25 MG (50000 UNIT) PO CAPS: 50000.0000 [IU] | ORAL_CAPSULE | ORAL | Status: DC

## 2015-12-13 MED ORDER — SYNTHROID 125 MCG PO TABS: 125.0000 ug | ORAL_TABLET | Freq: Every day | ORAL | Status: DC

## 2015-12-13 MED ORDER — NORELGESTROMIN-ETH ESTRADIOL 150-35 MCG/24HR TD PTWK: MEDICATED_PATCH | TRANSDERMAL | Status: DC

## 2015-12-13 NOTE — Telephone Encounter (Signed)
-----   Message from Joylene Igo, [place] sent at 12/13/2015 12:18 PM EDT ----- Please mail info on pre-diabetes, cholesterol and vit d def. Also see where she want s scripts mailed too as she mentioned changing pharmacies.

## 2015-12-13 NOTE — Telephone Encounter (Signed)
Notified pt of results and mailed all info to pt

## 2015-12-19 LAB — COMPREHENSIVE METABOLIC PANEL
A/G RATIO: 1.8 (ref 1.2–2.2)
ALT: 16 IU/L (ref 0–32)
AST: 23 IU/L (ref 0–40)
Albumin: 4.7 g/dL (ref 3.5–5.5)
Alkaline Phosphatase: 109 IU/L (ref 39–117)
BUN/Creatinine Ratio: 14 (ref 9–23)
BUN: 9 mg/dL (ref 6–24)
Bilirubin Total: 0.2 mg/dL (ref 0.0–1.2)
CALCIUM: 9.6 mg/dL (ref 8.7–10.2)
CO2: 23 mmol/L (ref 18–29)
CREATININE: 0.65 mg/dL (ref 0.57–1.00)
Chloride: 102 mmol/L (ref 96–106)
GFR, EST AFRICAN AMERICAN: 122 mL/min/{1.73_m2} (ref >59)
GFR, EST NON AFRICAN AMERICAN: 106 mL/min/{1.73_m2} (ref >59)
Globulin, Total: 2.6 g/dL (ref 1.5–4.5)
Glucose: 111 mg/dL — ABNORMAL HIGH (ref 65–99)
POTASSIUM: 4.2 mmol/L (ref 3.5–5.2)
Sodium: 144 mmol/L (ref 134–144)
TOTAL PROTEIN: 7.3 g/dL (ref 6.0–8.5)

## 2015-12-19 LAB — CBC
HEMATOCRIT: 40 % (ref 34.0–46.6)
Hemoglobin: 13 g/dL (ref 11.1–15.9)
MCH: 26.2 pg — ABNORMAL LOW (ref 26.6–33.0)
MCHC: 32.5 g/dL (ref 31.5–35.7)
MCV: 81 fL (ref 79–97)
PLATELETS: 459 10*3/uL — AB (ref 150–379)
RBC: 4.96 x10E6/uL (ref 3.77–5.28)
RDW: 16.2 % — ABNORMAL HIGH (ref 12.3–15.4)
WBC: 12.1 10*3/uL — AB (ref 3.4–10.8)

## 2015-12-19 LAB — IRON: Iron: 34 ug/dL (ref 27–159)

## 2015-12-19 LAB — LIPID PANEL
CHOL/HDL RATIO: 4.2 ratio (ref 0.0–4.4)
Cholesterol, Total: 182 mg/dL (ref 100–199)
HDL: 43 mg/dL (ref >39)
LDL CALC: 110 mg/dL — AB (ref 0–99)
Triglycerides: 147 mg/dL (ref 0–149)
VLDL CHOLESTEROL CAL: 29 mg/dL (ref 5–40)

## 2015-12-19 LAB — THYROID PANEL WITH TSH
Free Thyroxine Index: 1.2 (ref 1.2–4.9)
T3 Uptake Ratio: 22 % — ABNORMAL LOW (ref 24–39)
T4 TOTAL: 5.4 ug/dL (ref 4.5–12.0)
TSH: 34.04 u[IU]/mL — ABNORMAL HIGH (ref 0.450–4.500)

## 2015-12-19 LAB — VITAMIN D 25 HYDROXY (VIT D DEFICIENCY, FRACTURES): VIT D 25 HYDROXY: 28.1 ng/mL — AB (ref 30.0–100.0)

## 2015-12-19 LAB — FOLATE: Folate: 17.9 ng/mL (ref >3.0)

## 2015-12-19 LAB — HEMOGLOBIN A1C
Est. average glucose Bld gHb Est-mCnc: 117 mg/dL
HEMOGLOBIN A1C: 5.7 % — AB (ref 4.8–5.6)

## 2015-12-23 ENCOUNTER — Encounter: Payer: Self-pay | Admitting: Family Medicine

## 2015-12-23 ENCOUNTER — Ambulatory Visit (INDEPENDENT_AMBULATORY_CARE_PROVIDER_SITE_OTHER): Payer: BLUE CROSS/BLUE SHIELD | Admitting: Family Medicine

## 2015-12-23 VITALS — BP 126/78 | HR 72 | Temp 98.4°F | Ht 61.0 in | Wt 208.2 lb

## 2015-12-23 DIAGNOSIS — M549 Dorsalgia, unspecified: Secondary | ICD-10-CM | POA: Diagnosis not present

## 2015-12-23 DIAGNOSIS — M25529 Pain in unspecified elbow: Secondary | ICD-10-CM | POA: Diagnosis not present

## 2015-12-23 DIAGNOSIS — M25522 Pain in left elbow: Secondary | ICD-10-CM

## 2015-12-23 DIAGNOSIS — M791 Myalgia, unspecified site: Secondary | ICD-10-CM

## 2015-12-23 DIAGNOSIS — M25521 Pain in right elbow: Secondary | ICD-10-CM | POA: Insufficient documentation

## 2015-12-23 MED ORDER — CARISOPRODOL 350 MG PO TABS: 350.0000 mg | ORAL_TABLET | Freq: Three times a day (TID) | ORAL | Status: DC | PRN

## 2015-12-23 NOTE — Assessment & Plan Note (Signed)
Suspect lateral epicondylitis as cause given location. No discomfort on exam today. Advised on icing. Can use elbow brace that she has at home as this has helped before. Also advised she could pick up a tennis elbow brace at the pharmacy. She'll continue to monitor.

## 2015-12-23 NOTE — Progress Notes (Signed)
Patient ID: Tina Moreno, female   DOB: [birth year], 50 y.o.   MRN: [id number]  Tommi Rumps, MD Phone: [phone]  [name] is a 50 y.o. female who presents today for new patient visit.  Bilateral elbow pain: Left greater than right. Notes his been hurting for the last several weeks. Is just an achy sensation. It hurts more when picking stuff up. Location is in the lateral epicondyles area. No numbness or weakness. Had a similar issue last year and had some swelling at that time. No injury. She used an elbow and that helped with this.  Back pain: Patient notes intermittent chronic history of back pain that is described as her muscles being tight. No radiation to her legs. No numbness or weakness. Has had x-rays that were benign per her report. She's been using Soma for her muscle discomfort. Notes she does not take this often. She does not take this at the same time as her Xanax or Klonopin.  She also reports she needs a vision check for a physical form. She notes her primary midwife filled out most of the form though was unable to do the revision portion. Vision portion was reviewed and required vision screening, color vision testing, and peripheral vision testing.:  Active Ambulatory Problems    Diagnosis Date Noted  . Hypothyroid 01/11/2012  . Elevated WBCs 01/11/2012  . Obesity 01/11/2012  . Back pain   . Pre-diabetes 12/13/2015  . Vitamin D deficiency 12/13/2015  . Elevated LDL cholesterol level 12/13/2015  . Bilateral elbow joint pain 12/23/2015   Resolved Ambulatory Problems    Diagnosis Date Noted  . No Resolved Ambulatory Problems   Past Medical History  Diagnosis Date  . Cervical dysplasia   . Anxiety   . Depression   . Abnormal Pap smear of cervix   . GERD (gastroesophageal reflux disease)     Family History  Problem Relation Age of Onset  . Parkinsonism Father   . Diabetes Father   . Hypertension Father   . Cancer Maternal Aunt     SOFT CELL  SARCOMA  . Cancer Maternal Uncle     TESTICULAR  . Cancer Paternal Grandmother     COLON  . Cancer Son     TESTICULAR    Social History   Social History  . Marital Status: Single    Spouse Name: N/A  . Number of Children: N/A  . Years of Education: N/A   Occupational History  . Not on file.   Social History Main Topics  . Smoking status: Never Smoker   . Smokeless tobacco: Never Used  . Alcohol Use: Yes     Comment: OCCASSIONALLY  . Drug Use: No  . Sexual Activity: Not Currently   Other Topics Concern  . Not on file   Social History Narrative    ROS  General:  Negative for nexplained weight loss, fever Skin: Negative for new or changing mole, sore that won't heal HEENT: Negative for trouble hearing, trouble seeing, ringing in ears, mouth sores, hoarseness, change in voice, dysphagia. CV:  Negative for chest pain, dyspnea, edema, palpitations Resp: Negative for cough, dyspnea, hemoptysis GI: Negative for nausea, vomiting, diarrhea, constipation, abdominal pain, melena, hematochezia. GU: Negative for dysuria, incontinence, urinary hesitance, hematuria, vaginal or penile discharge, polyuria, sexual difficulty, lumps in testicle or breasts MSK: Negative for muscle cramps or aches, positive for joint pain or swelling Neuro: Negative for headaches, weakness, numbness, dizziness, passing out/fainting Psych: Negative for depression, anxiety, memory  problems  Objective  Physical Exam Filed Vitals:   12/23/15 1514  BP: 126/78  Pulse: 72  Temp: 98.4 F (36.9 C)    BP Readings from Last 3 Encounters:  12/23/15 126/78  12/11/15 106/78  02/23/14 122/72   Wt Readings from Last 3 Encounters:  12/23/15 208 lb 3.2 oz (94.439 kg)  12/11/15 209 lb 8 oz (95.029 kg)  02/23/14 207 lb (93.895 kg)    Physical Exam  Constitutional: She is well-developed, well-nourished, and in no distress.  HENT:  Head: Normocephalic and atraumatic.  Right Ear: External ear normal.    Left Ear: External ear normal.  Mouth/Throat: Oropharynx is clear and moist. No oropharyngeal exudate.  Eyes: Conjunctivae are normal. Pupils are equal, round, and reactive to light.  Neck: Neck supple.  Cardiovascular: Normal rate, regular rhythm and normal heart sounds.  Exam reveals no gallop and no friction rub.   No murmur heard. Pulmonary/Chest: Effort normal and breath sounds normal. No respiratory distress. She has no wheezes. She has no rales.  Abdominal: Soft. Bowel sounds are normal. She exhibits no distension. There is no tenderness. There is no rebound and no guarding.  Musculoskeletal: She exhibits no edema.  Bilateral elbows no swelling, warmth, or erythema, no tenderness to palpation of any aspect of the bilateral elbows No midline spine tenderness, no midline spine step-off, there is no muscular back tenderness, there is spasm noted in the bilateral rhomboids  Lymphadenopathy:    She has no cervical adenopathy.  Neurological: She is alert. Gait normal.  5/5 strength in bilateral biceps, triceps, grip, quads, hamstrings, plantar and dorsiflexion, sensation to light touch intact in bilateral UE and LE, normal gait, 2+ patellar reflexes  Skin: Skin is warm and dry. She is not diaphoretic.  Psychiatric: Mood and affect normal.     Assessment/Plan:   Back pain Chronic intermittent issue. Managed with Soma. Neurologically intact in her upper and lower extremities. Some spasm on exam but no discomfort at this time. We'll refill Soma. Advised not take this at the same time as her Klonopin or Xanax. Advised this medication could make her drowsy. Given return precautions.  Bilateral elbow joint pain Suspect lateral epicondylitis as cause given location. No discomfort on exam today. Advised on icing. Can use elbow brace that she has at home as this has helped before. Also advised she could pick up a tennis elbow brace at the pharmacy. She'll continue to monitor.  Patient will  check with the organization who needs the form filled out to see what what extent of vision screening she needs as we do not do color vision screening and peripheral vision screening at this office.  No orders of the defined types were placed in this encounter.    Meds ordered this encounter  Medications  . carisoprodol (SOMA) 350 MG tablet    Sig: Take 1 tablet (350 mg total) by mouth 3 (three) times daily as needed.    Dispense:  30 tablet    Refill:  0     Tommi Rumps, MD [place]

## 2015-12-23 NOTE — Progress Notes (Signed)
Pre visit review using our clinic review tool, if applicable. No additional management support is needed unless otherwise documented below in the visit note. 

## 2015-12-23 NOTE — Patient Instructions (Signed)
Nice to meet you. I refilled her Soma. Do not take this at the same time as the Klonopin or Xanax. Please try the brace you have at home for your elbows and if this does not work you can buy a tennis elbow brace over-the-counter at the pharmacy. Please check with the people that needs a form filled out regarding the vision section and if you need all parts of vision section completed. If you develop numbness or weakness, bowel or bladder incontinence, numbness between your legs, fever, worsening pain, or any new or changing symptoms please seek medical attention.

## 2015-12-23 NOTE — Assessment & Plan Note (Addendum)
Chronic intermittent issue. Managed with Soma. Neurologically intact in her upper and lower extremities. Some spasm on exam but no discomfort at this time. We'll refill Soma. Advised not take this at the same time as her Klonopin or Xanax. Advised this medication could make her drowsy. Given return precautions.

## 2016-02-05 ENCOUNTER — Telehealth: Payer: Self-pay | Admitting: Obstetrics and Gynecology

## 2016-02-05 NOTE — Telephone Encounter (Signed)
Ms. Brownback called asking if she can come in tomorrow morning to have labs drawn. There's no order in for her but she said when she had her labs drawn in March, she was told to have a one month follow-up appointment. She'd like a phone call to make sure she can come in.  Pt's ph# [phone] Thank you.

## 2016-02-06 ENCOUNTER — Other Ambulatory Visit: Payer: BLUE CROSS/BLUE SHIELD

## 2016-02-06 DIAGNOSIS — R7989 Other specified abnormal findings of blood chemistry: Secondary | ICD-10-CM

## 2016-02-06 DIAGNOSIS — Z01419 Encounter for gynecological examination (general) (routine) without abnormal findings: Secondary | ICD-10-CM

## 2016-02-06 DIAGNOSIS — R946 Abnormal results of thyroid function studies: Secondary | ICD-10-CM | POA: Diagnosis not present

## 2016-02-06 NOTE — Telephone Encounter (Signed)
Labs were done that needed to be done

## 2016-02-07 LAB — THYROID PANEL WITH TSH
Free Thyroxine Index: 2.5 (ref 1.2–4.9)
T3 Uptake Ratio: 18 % — ABNORMAL LOW (ref 24–39)
T4 TOTAL: 13.7 ug/dL — AB (ref 4.5–12.0)
TSH: 6.88 u[IU]/mL — ABNORMAL HIGH (ref 0.450–4.500)

## 2016-03-09 DIAGNOSIS — F331 Major depressive disorder, recurrent, moderate: Secondary | ICD-10-CM | POA: Diagnosis not present

## 2016-03-12 ENCOUNTER — Other Ambulatory Visit: Payer: BLUE CROSS/BLUE SHIELD

## 2016-03-12 ENCOUNTER — Other Ambulatory Visit: Payer: Self-pay | Admitting: *Deleted

## 2016-03-12 DIAGNOSIS — R946 Abnormal results of thyroid function studies: Secondary | ICD-10-CM

## 2016-03-13 DIAGNOSIS — R946 Abnormal results of thyroid function studies: Secondary | ICD-10-CM | POA: Diagnosis not present

## 2016-03-14 LAB — THYROID PANEL WITH TSH
FREE THYROXINE INDEX: 2.5 (ref 1.2–4.9)
T3 UPTAKE RATIO: 20 % — AB (ref 24–39)
T4 TOTAL: 12.7 ug/dL — AB (ref 4.5–12.0)
TSH: 5.05 u[IU]/mL — ABNORMAL HIGH (ref 0.450–4.500)

## 2016-03-16 ENCOUNTER — Encounter: Payer: Self-pay | Admitting: Obstetrics and Gynecology

## 2016-03-30 ENCOUNTER — Encounter: Payer: Self-pay | Admitting: Family Medicine

## 2016-03-30 ENCOUNTER — Ambulatory Visit (INDEPENDENT_AMBULATORY_CARE_PROVIDER_SITE_OTHER): Payer: BLUE CROSS/BLUE SHIELD | Admitting: Family Medicine

## 2016-03-30 VITALS — BP 118/68 | HR 65 | Temp 98.4°F | Ht 61.0 in | Wt 189.6 lb

## 2016-03-30 DIAGNOSIS — J45909 Unspecified asthma, uncomplicated: Secondary | ICD-10-CM | POA: Insufficient documentation

## 2016-03-30 DIAGNOSIS — R7303 Prediabetes: Secondary | ICD-10-CM

## 2016-03-30 DIAGNOSIS — E7800 Pure hypercholesterolemia, unspecified: Secondary | ICD-10-CM | POA: Diagnosis not present

## 2016-03-30 DIAGNOSIS — J4520 Mild intermittent asthma, uncomplicated: Secondary | ICD-10-CM | POA: Diagnosis not present

## 2016-03-30 DIAGNOSIS — E039 Hypothyroidism, unspecified: Secondary | ICD-10-CM | POA: Diagnosis not present

## 2016-03-30 LAB — HEMOGLOBIN A1C: Hgb A1c MFr Bld: 5.8 % (ref 4.6–6.5)

## 2016-03-30 MED ORDER — ALBUTEROL SULFATE HFA 108 (90 BASE) MCG/ACT IN AERS: 2.0000 | INHALATION_SPRAY | Freq: Four times a day (QID) | RESPIRATORY_TRACT | Status: DC | PRN

## 2016-03-30 MED ORDER — SYNTHROID 125 MCG PO TABS: 125.0000 ug | ORAL_TABLET | Freq: Every day | ORAL | Status: DC

## 2016-03-30 NOTE — Assessment & Plan Note (Signed)
Has been trending down over the last several years. Encouraged continued diet and exercise.

## 2016-03-30 NOTE — Progress Notes (Signed)
Pre visit review using our clinic review tool, if applicable. No additional management support is needed unless otherwise documented below in the visit note. 

## 2016-03-30 NOTE — Assessment & Plan Note (Signed)
Patient's history of wheezing, coughing, and issues catching her breath during the fall and spring seem consistent with reactive airway disease. No cardiac symptoms. Discussed options of waiting for this to occur again versus prescribing albuterol inhaler to see if she improves with this. She opted for albuterol inhaler. Advised that if she did not improve with use of that she needs to seek medical attention. Advised that if she develops any additional symptoms she should seek medical attention. She is given return precautions.

## 2016-03-30 NOTE — Assessment & Plan Note (Signed)
A1c 5.7 on last check. Check today.

## 2016-03-30 NOTE — Assessment & Plan Note (Signed)
TSH is been trending down recently since being on Synthroid 125 g. Continue this dosing. We'll plan to recheck TSH in 3 months.

## 2016-03-30 NOTE — Progress Notes (Signed)
Patient ID: Tina Moreno, female   DOB: August 28, 1966, 50 y.o.   MRN: [id number]  Tommi Rumps, MD Phone: [phone]  Tina Moreno is a 50 y.o. female who presents today for follow-up.  HYPOTHYROIDISM Disease Monitoring Weight changes: Has had some weight decrease though has been dieting to achieve this.  Skin Changes: No Palpitations: No Heat/Cold intolerance: Reports some heat intolerance  Medication Monitoring Compliance:  Taking Synthroid 125 g daily though ran out last week   Last TSH:   Lab Results  Component Value Date   TSH 5.050* 03/13/2016   Elevated LDL cholesterol: Has been decreasing over the last several years. No chest pain. Not on any medications for this. Has been working on diet and has lost about 40 pounds.  Prediabetes: No polyuria or polydipsia. Was supposed to have her A1c rechecked last month though this did not occur at her other physicians office. Father has diabetes.  Reactive airway disease: Patient notes intermittently in the spring and fall she'll have issues catching her breath and having some wheezing and coughing typically only if she is outside. Has been going on for a number of years. She has no exertional component to this. No chest pain. No orthopnea or PND. Notes this resolves with going inside and relaxing. Does have a history significant for allergies. In the past has been told to take Benadryl for this. No family history of cardiac disease. No personal history of cardiac disease.  PMH: nonsmoker.   ROS see history of present illness  Objective  Physical Exam Filed Vitals:   03/30/16 0856  BP: 118/68  Pulse: 65  Temp: 98.4 F (36.9 C)    BP Readings from Last 3 Encounters:  03/30/16 118/68  12/23/15 126/78  12/11/15 106/78   Wt Readings from Last 3 Encounters:  03/30/16 189 lb 9.6 oz (86.002 kg)  12/23/15 208 lb 3.2 oz (94.439 kg)  12/11/15 209 lb 8 oz (95.029 kg)    Physical Exam  Constitutional: She is  well-developed, well-nourished, and in no distress.  HENT:  Head: Normocephalic and atraumatic.  Right Ear: External ear normal.  Left Ear: External ear normal.  Neck: No thyromegaly present.  Cardiovascular: Normal rate, regular rhythm and normal heart sounds.   Pulmonary/Chest: Effort normal and breath sounds normal.  Neurological: She is alert. Gait normal.  Skin: Skin is dry. She is not diaphoretic.     Assessment/Plan: Please see individual problem list.  Elevated LDL cholesterol level Has been trending down over the last several years. Encouraged continued diet and exercise.  Pre-diabetes A1c 5.7 on last check. Check today.  Hypothyroid TSH is been trending down recently since being on Synthroid 125 g. Continue this dosing. We'll plan to recheck TSH in 3 months.  Reactive airway disease Patient's history of wheezing, coughing, and issues catching her breath during the fall and spring seem consistent with reactive airway disease. No cardiac symptoms. Discussed options of waiting for this to occur again versus prescribing albuterol inhaler to see if she improves with this. She opted for albuterol inhaler. Advised that if she did not improve with use of that she needs to seek medical attention. Advised that if she develops any additional symptoms she should seek medical attention. She is given return precautions.    Orders Placed This Encounter  Procedures  . HgB A1c    Meds ordered this encounter  Medications  . SYNTHROID 125 MCG tablet    Sig: Take 1 tablet (125 mcg total) by mouth  daily before breakfast.    Dispense:  90 tablet    Refill:  1  . albuterol (PROVENTIL HFA;VENTOLIN HFA) 108 (90 Base) MCG/ACT inhaler    Sig: Inhale 2 puffs into the lungs every 6 (six) hours as needed for wheezing or shortness of breath.    Dispense:  1 Inhaler    Refill:  0   Tommi Rumps, MD [place]

## 2016-03-30 NOTE — Patient Instructions (Signed)
Nice to see you. I have refilled or Synthroid. Please continue to work on diet and exercise reviewed prediabetes. I have sent in an albuterol inhaler for you to use for your possible asthma. Please let us know if this is helpful. If you have to use this please follow-up. If you develop chest pain, shortness of breath, or any new or changing symptoms please seek medical attention.

## 2016-06-30 ENCOUNTER — Ambulatory Visit: Payer: BLUE CROSS/BLUE SHIELD | Admitting: Family Medicine

## 2016-07-06 ENCOUNTER — Encounter: Payer: Self-pay | Admitting: Family Medicine

## 2016-07-06 ENCOUNTER — Ambulatory Visit (INDEPENDENT_AMBULATORY_CARE_PROVIDER_SITE_OTHER): Payer: BLUE CROSS/BLUE SHIELD | Admitting: Family Medicine

## 2016-07-06 VITALS — BP 108/66 | HR 80 | Temp 98.4°F | Wt 191.2 lb

## 2016-07-06 DIAGNOSIS — R51 Headache: Secondary | ICD-10-CM

## 2016-07-06 DIAGNOSIS — M546 Pain in thoracic spine: Secondary | ICD-10-CM

## 2016-07-06 DIAGNOSIS — M791 Myalgia, unspecified site: Secondary | ICD-10-CM

## 2016-07-06 DIAGNOSIS — E039 Hypothyroidism, unspecified: Secondary | ICD-10-CM | POA: Diagnosis not present

## 2016-07-06 DIAGNOSIS — R7303 Prediabetes: Secondary | ICD-10-CM

## 2016-07-06 DIAGNOSIS — J309 Allergic rhinitis, unspecified: Secondary | ICD-10-CM

## 2016-07-06 DIAGNOSIS — G44229 Chronic tension-type headache, not intractable: Secondary | ICD-10-CM

## 2016-07-06 DIAGNOSIS — G8929 Other chronic pain: Secondary | ICD-10-CM

## 2016-07-06 DIAGNOSIS — R519 Headache, unspecified: Secondary | ICD-10-CM | POA: Insufficient documentation

## 2016-07-06 LAB — HEMOGLOBIN A1C: HEMOGLOBIN A1C: 5.8 % (ref 4.6–6.5)

## 2016-07-06 LAB — TSH: TSH: 0.99 u[IU]/mL (ref 0.35–4.50)

## 2016-07-06 MED ORDER — CARISOPRODOL 350 MG PO TABS
350.0000 mg | ORAL_TABLET | Freq: Three times a day (TID) | ORAL | 0 refills | Status: DC | PRN
Start: 2016-07-06 — End: 2018-11-30

## 2016-07-06 NOTE — Assessment & Plan Note (Signed)
Patient's upper respiratory symptoms most consistent with allergies. Discussed Allegra over-the-counter. If not beneficial she can try Flonase.

## 2016-07-06 NOTE — Assessment & Plan Note (Signed)
A1c today.  

## 2016-07-06 NOTE — Patient Instructions (Signed)
Nice to see you. We will refer you back to physical therapy for your back. You can try Allegra for your sinus drainage that is likely related to allergies. If you develop worsening headache, back pain, numbness, weakness, vision changes, numbness between your legs, loss of bowel or bladder function, fevers, or any new or changing symptoms please seek medical attention immediately.

## 2016-07-06 NOTE — Progress Notes (Signed)
Pre visit review using our clinic review tool, if applicable. No additional management support is needed unless otherwise documented below in the visit note. 

## 2016-07-06 NOTE — Assessment & Plan Note (Signed)
Patient was due for lab work today. TSH ordered.

## 2016-07-06 NOTE — Progress Notes (Signed)
Tommi Rumps, MD Phone: [phone]  Tina Moreno is a 50 y.o. female who presents today for follow-up.  Chronic back pain: Patient notes recent flare of this. Hurts from midthoracic up to her upper back. Notes this was brought on by standing all day. Notes no numbness or weakness. No radiation of the pain. Does note some headaches with the discomfort in her back. Notes she took [place] and this helped. Put her to sleep. It is a dull pain. No numbness, weakness, loss of bowel or bladder function, saddle anesthesia, or fevers.  Headaches: Patient notes a dull headache on the top of her head. She has a history of headaches in the past. It is a dull headache that is typical of her prior headaches. No numbness, weakness, or vision changes. Notes ibuprofen does help some. Usually gets her headaches in conjunction with her back pain.  Patient additionally notes some sinus drainage and rhinorrhea. Occasionally blows some murky mucus out of her nose. No sinus or nasal congestion. No ear congestion. She did take some allergy sinus medication the first week of her symptoms though has not taken this in the last 2 weeks. She occasionally takes Allegra-D. Does have a history of allergies that worsened as she ages.  Patient does additionally due for TSH and A1c checks. She is currently taking Synthroid.  PMH: nonsmoker.   ROS see history of present illness  Objective  Physical Exam Vitals:   07/06/16 0904  BP: 108/66  Pulse: 80  Temp: 98.4 F (36.9 C)    BP Readings from Last 3 Encounters:  07/06/16 108/66  03/30/16 118/68  12/23/15 126/78   Wt Readings from Last 3 Encounters:  07/06/16 191 lb 3.2 oz (86.7 kg)  03/30/16 189 lb 9.6 oz (86 kg)  12/23/15 208 lb 3.2 oz (94.4 kg)    Physical Exam  Constitutional: She is well-developed, well-nourished, and in no distress.  HENT:  Head: Normocephalic and atraumatic.  Mouth/Throat: Oropharynx is clear and moist. No oropharyngeal exudate.   Normal TMs bilaterally  Eyes: Conjunctivae are normal. Pupils are equal, round, and reactive to light.  Neck: Neck supple.  Cardiovascular: Normal rate, regular rhythm and normal heart sounds.   Pulmonary/Chest: Effort normal and breath sounds normal.  Musculoskeletal: She exhibits no edema.  No midline spine tenderness, no midline spine step-off, no muscular back tenderness, no muscular neck tenderness  Lymphadenopathy:    She has no cervical adenopathy.  Neurological: She is alert.  CN 2-12 intact, 5/5 strength in bilateral biceps, triceps, grip, quads, hamstrings, plantar and dorsiflexion, sensation to light touch intact in bilateral UE and LE, normal gait, 2+ patellar reflexes  Skin: Skin is warm and dry. She is not diaphoretic.     Assessment/Plan: Please see individual problem list.  Hypothyroid Patient was due for lab work today. TSH ordered.  Back pain Chronic issue. Exacerbated recently. Discussed continuing Soma. We'll refer to physical therapy. Given return precautions.  Pre-diabetes A1c today.  Headache Patient notes chronic intermittent tension type headaches typically associated with her back pain. Neurologically intact. Discussed continuing ibuprofen. We'll try physical therapy to help with her back to see if this will benefit her headaches. Given return precautions.  Allergic rhinitis Patient's upper respiratory symptoms most consistent with allergies. Discussed Allegra over-the-counter. If not beneficial she can try Flonase.   Orders Placed This Encounter  Procedures  . TSH  . HgB A1c  . Ambulatory referral to Physical Therapy    Referral Priority:   Routine  Referral Type:   Physical Medicine    Referral Reason:   Specialty Services Required    Requested Specialty:   Physical Therapy    Number of Visits Requested:   1    Meds ordered this encounter  Medications  . carisoprodol (SOMA) 350 MG tablet    Sig: Take 1 tablet (350 mg total) by mouth 3  (three) times daily as needed.    Dispense:  30 tablet    Refill:  0    Tommi Rumps, MD [place]

## 2016-07-06 NOTE — Assessment & Plan Note (Signed)
Chronic issue. Exacerbated recently. Discussed continuing Soma. We'll refer to physical therapy. Given return precautions.

## 2016-07-06 NOTE — Assessment & Plan Note (Signed)
Patient notes chronic intermittent tension type headaches typically associated with her back pain. Neurologically intact. Discussed continuing ibuprofen. We'll try physical therapy to help with her back to see if this will benefit her headaches. Given return precautions.

## 2016-07-07 DIAGNOSIS — F331 Major depressive disorder, recurrent, moderate: Secondary | ICD-10-CM | POA: Diagnosis not present

## 2016-07-30 ENCOUNTER — Encounter: Payer: Self-pay | Admitting: Physical Therapy

## 2016-07-30 ENCOUNTER — Ambulatory Visit: Payer: BLUE CROSS/BLUE SHIELD | Attending: Family Medicine | Admitting: Physical Therapy

## 2016-07-30 DIAGNOSIS — M6281 Muscle weakness (generalized): Secondary | ICD-10-CM | POA: Diagnosis not present

## 2016-07-30 DIAGNOSIS — M62838 Other muscle spasm: Secondary | ICD-10-CM

## 2016-07-30 DIAGNOSIS — M546 Pain in thoracic spine: Secondary | ICD-10-CM | POA: Diagnosis not present

## 2016-07-31 NOTE — Therapy (Signed)
[facility] [address] [place], [address] Phone: [phone]   Fax:  [phone]  Physical Therapy Evaluation  Patient Details  Name: Tina Moreno MRN: [id number] Date of Birth: 02/25/1966 Referring Provider: Leone Haven MD  Encounter Date: 07/30/2016      PT End of Session - 07/30/16 0914    Visit Number 1   Number of Visits 16   Date for PT Re-Evaluation 09/24/16   PT Start Time 0805   PT Stop Time 0900   PT Time Calculation (min) 55 min   Activity Tolerance Patient tolerated treatment well   Behavior During Therapy [facility] for tasks assessed/performed      Past Medical History:  Diagnosis Date  . Abnormal Pap smear of cervix   . Anxiety   . Back pain   . Cervical dysplasia   . Depression   . Elevated WBCs    NEGATIVE HEM W/U  . GERD (gastroesophageal reflux disease)   . Hypothyroid     Past Surgical History:  Procedure Laterality Date  . COLPOSCOPY    . GYNECOLOGIC CRYOSURGERY      There were no vitals filed for this visit.       Subjective Assessment - 07/30/16 0824    Subjective Patient reports having pain in left hip, into back and upper back is also tight with right side worse than left.    Patient is accompained by: Family member   Pertinent History Patient reports having back pain for years and then ~ 10 years ago she had increased pain in back that gave her headaches. Most recent episode began ~ 6 months ago when standing at race [place] and she began to feel tightness in back and then that night it woke her up with a severe headache that she had to self manage with medication, hot shower. Since then it has not completely resolved and she continues with constant ache with exacerbations of pain that feel like electric shocks, stabbing pain. She has intermittent tingling into right UE when she feels extra tight.    Limitations Sitting;Standing;Walking;House hold  activities;Other (comment)  vacuuming, cooking   Diagnostic tests cervical spine X ray: negative   Patient Stated Goals decrease pain as much as possible, to manage self    Currently in Pain? Yes   Pain Score 6   ranges from 1/10 up to 9/10   Pain Location Back   Pain Orientation Mid;Lower;Right;Left   Pain Descriptors / Indicators Aching   Pain Type Chronic pain   Pain Onset More than a month ago   Pain Relieving Factors medication, rest, hot showers, heating pad   Effect of Pain on Daily Activities limits ability to clean, sit for extended time, standing activities            [facility] PT Assessment - 07/30/16 0816      Assessment   Medical Diagnosis Chronic bilateral thoracic  back pain M54.6, G89.29   Referring Provider Leone Haven MD   Onset Date/Surgical Date 01/20/16   Hand Dominance Right   Next MD Visit October 06, 2016   Prior Therapy yes, last treated ~ 4 years ago     Precautions   Precautions None     Balance Screen   Has the patient fallen in the past 6 months No   Has the patient had a decrease in activity level because of a fear of falling?  No   Is the  patient reluctant to leave their home because of a fear of falling?  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   Leisure Animator, Financial risk analyst   Overall Cognitive Status Within Functional Limits for tasks assessed     Objective: Posture: forward head posture mild/moderate, forward rounded shoulders with sitting>standing AROM; UE's;  WNL's shoulders, elbows, wrists without exacerbation of symptoms Cervical spine: flexion 75, extension 55, rotation right 65, left 60 with increased stiffness in neck, side bending right and left 50 with increased stiffness Lumbar spine: flexion/extension WNL's without reproduction of symptoms Strength: Both UE's major muscle groups strong without reproduction of symptoms Sensation; light touch intact Special testing: Compression cervical spine negative Distraction: negative spurlings negative bilaterally Joint mobility: decreased lower cervical spine through mid thoracic spine  Treatment: Therapeutic exercise: performed with guidance, VC, tactile cues and demonstration of therapist Scapular retraction at door frame x 10 reps Cervical retraction x 10 Supine lying: with towel roll lengthwise along thoracic spine: pectoral stretch with snow angel Cervical retraction Suboccipital release with home instruction for use of racquet balls  Patient response to treatment: Patient demonstrated improved technique with exercises with moderatel VC for correct alignment. Patient with decreased pain from   6/10 to  4/10.  Improved motor control with repetition and cuing for scapula and cervical retractions         PT Education - 07/30/16 0900    Education provided Yes   Education Details body mechanics, sitting and lying postures, use of lumbar and cervical rolls; HEP: scapular retraction, cervical spine retraction, prone lying   Person(s) Educated Patient   Methods Explanation;Demonstration;Verbal cues;Handout   Comprehension Verbalized understanding;Returned demonstration;Verbal cues required             PT Long Term Goals - 07/30/16 0915      PT LONG TERM GOAL #1   Title Patient will report max pain level of 5/10 with aggravating factors of sitting, standing by 09/24/2016 demonstrating improved function   Baseline pain level max 9/10   Status New     PT LONG TERM GOAL #2   Title  MODI improved to <20% self perceived impairment demonstrating improved function with dialy tasks, sitting, work, baking, 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 guidance, instruction/cuing   Status New               Plan - 07/30/16 0910    Clinical Impression Statement Patient is a 50 year old right hand dominant female who presents with exacerbation of chronic back pain with pain radiating into left hip and causing headaches. She has pain level that ranges from 1/10 up to 9/10 and MODI  score of 30% indicating moderate lsef perceived disability for daily tasks, personal care activties and work related tasks. She has mild limitations with AROM cervical spine primarily with side bending with stiffness as chief complaint. Her primary area of pain is in thoracic spine with point tenderness mid thoracic region. She has limited knowledge of appropriate pain control, proper posture/body mechanis for sleep, sitting, standing and will benefit from physical therapy intervention to achieve improved function.    Rehab Potential Good   Clinical Impairments Affecting Rehab Potential (+) age, motivated, acute exacerbation of chronic condition (-) chronic back pain   PT Frequency 2x / week   PT Duration 8 weeks   PT Treatment/Interventions Iontophoresis 4mg /ml Dexamethasone;Electrical Stimulation;Patient/family education;Cryotherapy;Neuromuscular re-education;Manual techniques;Therapeutic exercise;Dry needling;Moist Heat;Ultrasound   PT Next Visit Plan pain control, therapeutic exercise   PT Home Exercise Plan posture exercises, use of cervical and lumbar supports for sleep/sitting, break every hour at work to  correct posture   Consulted and Agree with Plan of Care Patient      Patient will benefit from skilled therapeutic intervention in order to improve the following deficits and impairments:  Postural dysfunction, Decreased strength, Improper body mechanics, Pain, Impaired perceived functional ability, Increased muscle spasms, Decreased range of motion  Visit Diagnosis: Pain in thoracic spine - Plan: PT plan of care cert/re-cert  Muscle weakness (generalized) - Plan: PT plan of care cert/re-cert  Other muscle spasm - Plan: PT plan of care cert/re-cert     Problem List Patient Active Problem List   Diagnosis Date Noted  . Headache 07/06/2016  . Allergic rhinitis 07/06/2016  . Reactive airway disease 03/30/2016  . Bilateral elbow joint pain 12/23/2015  . Pre-diabetes 12/13/2015  . Vitamin D deficiency 12/13/2015  . Elevated LDL cholesterol level 12/13/2015  . Back pain   . Hypothyroid 01/11/2012  . Elevated WBCs 01/11/2012  . Obesity 01/11/2012    [name] PT 07/31/2016, 1:47 PM  [facility] [address], [place], [address] Phone: [phone]   Fax:  [phone]  Name: Tina Moreno MRN: [id number] Date of Birth: March 11, 1966

## 2016-08-03 ENCOUNTER — Encounter: Payer: Self-pay | Admitting: Physical Therapy

## 2016-08-03 ENCOUNTER — Ambulatory Visit: Payer: BLUE CROSS/BLUE SHIELD | Admitting: Physical Therapy

## 2016-08-03 DIAGNOSIS — M6281 Muscle weakness (generalized): Secondary | ICD-10-CM | POA: Diagnosis not present

## 2016-08-03 DIAGNOSIS — M546 Pain in thoracic spine: Secondary | ICD-10-CM | POA: Diagnosis not present

## 2016-08-03 DIAGNOSIS — M62838 Other muscle spasm: Secondary | ICD-10-CM

## 2016-08-04 ENCOUNTER — Encounter: Payer: BLUE CROSS/BLUE SHIELD | Admitting: Physical Therapy

## 2016-08-04 NOTE — Therapy (Signed)
[facility] [address], [place], [address] Phone: [phone]   Fax:  [phone]  Physical Therapy Treatment  Patient Details  Name: Tina Moreno MRN: [id number] Date of Birth: Jun 04, 1966 Referring Provider: Leone Haven MD  Encounter Date: 08/03/2016      PT End of Session - 08/03/16 1945    Visit Number 2   Number of Visits 16   Date for PT Re-Evaluation 09/24/16   PT Start Time 1850   PT Stop Time 1935   PT Time Calculation (min) 45 min   Activity Tolerance Patient tolerated treatment well   Behavior During Therapy [facility] for tasks assessed/performed      Past Medical History:  Diagnosis Date  . Abnormal Pap smear of cervix   . Anxiety   . Back pain   . Cervical dysplasia   . Depression   . Elevated WBCs    NEGATIVE HEM W/U  . GERD (gastroesophageal reflux disease)   . Hypothyroid     Past Surgical History:  Procedure Laterality Date  . COLPOSCOPY    . GYNECOLOGIC CRYOSURGERY      There were no vitals filed for this visit.      Subjective Assessment - 08/03/16 1856    Subjective Patient tight in upper back both sides about the same. Still having pain in left hip.   Limitations Sitting;Standing;Walking;House hold activities;Other (comment)  vacuuming, cooking   Patient Stated Goals decrease pain as much as possible, to manage self    Currently in Pain? Yes   Pain Score 6    Pain Location Back   Pain Orientation Right;Left;Mid;Lower   Pain Type Chronic pain   Pain Onset More than a month ago      Objective: Palpation: + spasms bilateral upper trapezius muscles, cervical spine paraspinals and suboccipital muscles  Treatment:  Manual therapy: STM cervical spine paraspinal muscles, suboccipital release and upper trapezius stretches 3 x 20 reps with patient supine lying  Therapeutic exercises; patient performed with verbal, tactile cuing and demonstration of  therapist Supine lying: Cervical spine retraction with isometric extension x 10 reps  Cervical rotation isometric with 5 second holds x 3 reps  Modalities:  Electrical stimulation: high volt (4) electrodes applied to each side upper/mid trapezius muscles with patient seated: muscle spasm clinical protocol; patient seated with moist heat applied to same during treatment x 20 min.; goal; pain and reduction of spasms  Patient response to treatment: patient demonstrated improved technique with exercises with minimal VC for correct alignment. Patient with decreased pain from  6/10 to 3/10. Patient with decreased spasms by 50% following STM. Improved motor control with repetition and cuing         PT Education - 08/03/16 1930    Education provided Yes   Education Details HEP: supine lying cervical spine retraction, extension,    Person(s) Educated Patient   Methods Explanation;Demonstration;Verbal cues   Comprehension Verbalized understanding;Verbal cues required;Returned demonstration             PT Long Term Goals - 07/30/16 0915      PT LONG TERM GOAL #1   Title Patient will report max pain level of 5/10 with aggravating factors of sitting, standing by 09/24/2016 demonstrating improved function   Baseline pain level max 9/10   Status New     PT LONG TERM GOAL #2   Title MODI improved to <20% self perceived impairment demonstrating improved function with dialy tasks,  sitting, work, Control and instrumentation engineer, 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 guidance, instruction/cuing   Status New               Plan - 08/03/16 1935    Clinical Impression Statement Patient demonstrated decreased  spasms and pain following treatment. She should continue to improve with decreased spasms and pain with continued physical therapy intervention.    Rehab Potential Good   PT Frequency 2x / week   PT Duration 8 weeks   PT Treatment/Interventions Iontophoresis 4mg /ml Dexamethasone;Electrical Stimulation;Patient/family education;Cryotherapy;Neuromuscular re-education;Manual techniques;Therapeutic exercise;Dry needling;Moist Heat;Ultrasound   PT Next Visit Plan pain control, therapeutic exercise   PT Home Exercise Plan posture exercises, use of cervical and lumbar supports for sleep/sitting, break every hour at work to correct posture      Patient will benefit from skilled therapeutic intervention in order to improve the following deficits and impairments:  Postural dysfunction, Decreased strength, Improper body mechanics, Pain, Impaired perceived functional ability, Increased muscle spasms, Decreased range of motion  Visit Diagnosis: Pain in thoracic spine  Muscle weakness (generalized)  Other muscle spasm     Problem List Patient Active Problem List   Diagnosis Date Noted  . Headache 07/06/2016  . Allergic rhinitis 07/06/2016  . Reactive airway disease 03/30/2016  . Bilateral elbow joint pain 12/23/2015  . Pre-diabetes 12/13/2015  . Vitamin D deficiency 12/13/2015  . Elevated LDL cholesterol level 12/13/2015  . Back pain   . Hypothyroid 01/11/2012  . Elevated WBCs 01/11/2012  . Obesity 01/11/2012    [name] PT 08/04/2016, 10:53 PM  [facility] [address], [place], [address] Phone: [phone]   Fax:  [phone]  Name: Tina Moreno MRN: [id number] Date of Birth: [birth year]

## 2016-08-06 ENCOUNTER — Encounter: Payer: Self-pay | Admitting: Physical Therapy

## 2016-08-06 ENCOUNTER — Ambulatory Visit: Payer: BLUE CROSS/BLUE SHIELD | Admitting: Physical Therapy

## 2016-08-06 ENCOUNTER — Encounter: Payer: BLUE CROSS/BLUE SHIELD | Admitting: Physical Therapy

## 2016-08-06 DIAGNOSIS — M62838 Other muscle spasm: Secondary | ICD-10-CM | POA: Diagnosis not present

## 2016-08-06 DIAGNOSIS — M546 Pain in thoracic spine: Secondary | ICD-10-CM

## 2016-08-06 DIAGNOSIS — M6281 Muscle weakness (generalized): Secondary | ICD-10-CM | POA: Diagnosis not present

## 2016-08-06 NOTE — Therapy (Signed)
[facility] [address] [place], [address] Phone: [phone]   Fax:  [phone]  Physical Therapy Treatment  Patient Details  Name: Tina Moreno MRN: [id number] Date of Birth: 1965/11/02 Referring Provider: Leone Haven MD  Encounter Date: 08/06/2016      PT End of Session - 08/06/16 1826    Visit Number 3   Number of Visits 16   Date for PT Re-Evaluation 09/24/16   PT Start Time 1820   PT Stop Time 1903   PT Time Calculation (min) 43 min   Activity Tolerance Patient tolerated treatment well   Behavior During Therapy [facility] for tasks assessed/performed      Past Medical History:  Diagnosis Date  . Abnormal Pap smear of cervix   . Anxiety   . Back pain   . Cervical dysplasia   . Depression   . Elevated WBCs    NEGATIVE HEM W/U  . GERD (gastroesophageal reflux disease)   . Hypothyroid     Past Surgical History:  Procedure Laterality Date  . COLPOSCOPY    . GYNECOLOGIC CRYOSURGERY      There were no vitals filed for this visit.      Subjective Assessment - 08/06/16 1824    Subjective Patient tight in upper back both sides about the same. Still having pain in left hip.   Limitations Sitting;Standing;Walking;House hold activities;Other (comment)  vacuuming, cooking   Patient Stated Goals decrease pain as much as possible, to manage self    Currently in Pain? Yes   Pain Score 3    Pain Location Back   Pain Orientation Right;Left;Mid;Lower   Pain Descriptors / Indicators Tightness   Pain Type Chronic pain   Pain Onset More than a month ago      Objective: Palpation: + spasms bilateral upper trapezius muscles, cervical spine paraspinals and suboccipital muscles  Treatment:  Manual therapy: STM cervical spine paraspinal muscles, suboccipital release and upper trapezius stretches 3 x 20 reps with patient supine lying/ seated in massage chair: goal: reduce spasms ,  pain  Modalities:  Electrical stimulation: high volt (4) electrodes applied to each side upper/mid trapezius muscles with patient seated in massage chair: muscle spasm clinical protocol; patient seated with moist heat applied to same during treatment x 20 min.; goal; pain and reduction of spasms  Patient response to treatment: Patient with decreased stiffness in cervical spine ans decreased spasms in id back to mild, less tenderness by 50%.  Patient with pain decreased to mild from 3/10.         PT Education - 08/06/16 1840    Education provided Yes   Education Details re assessed use of cervical roll for sleeping   Person(s) Educated Patient   Methods Explanation   Comprehension Verbalized understanding             PT Long Term Goals - 07/30/16 0915      PT LONG TERM GOAL #1   Title Patient will report max pain level of 5/10 with aggravating factors of sitting, standing by 09/24/2016 demonstrating improved function   Baseline pain level max 9/10   Status New     PT LONG TERM GOAL #2   Title MODI improved to <20% self perceived impairment demonstrating improved function with dialy tasks, sitting, work, baking, sleeping by 09/24/2016   Baseline MODI 30%   Status New     PT LONG TERM GOAL #3   Title Independent  with home program for self managment of pain, flexibility/strengthening to prevent re occurrence of symptoms by discharge 09/24/2016   Baseline limited to no knowledge of appropriate pain control strategies, exercise progression, requires max. guidance, instruction/cuing   Status New               Plan - 08/06/16 1900    Clinical Impression Statement Patient is progressing well with current treatment. She continues with spasms and pain that will require additional physical therapy intervention to achieve goals.    Rehab Potential Good   PT Frequency 2x / week   PT Duration 8 weeks   PT Treatment/Interventions Iontophoresis 4mg /ml Dexamethasone;Electrical  Stimulation;Patient/family education;Cryotherapy;Neuromuscular re-education;Manual techniques;Therapeutic exercise;Dry needling;Moist Heat;Ultrasound   PT Next Visit Plan pain control, therapeutic exercise   PT Home Exercise Plan posture exercises, use of cervical and lumbar supports for sleep/sitting, break every hour at work to correct posture      Patient will benefit from skilled therapeutic intervention in order to improve the following deficits and impairments:  Postural dysfunction, Decreased strength, Improper body mechanics, Pain, Impaired perceived functional ability, Increased muscle spasms, Decreased range of motion  Visit Diagnosis: Pain in thoracic spine  Muscle weakness (generalized)  Other muscle spasm     Problem List Patient Active Problem List   Diagnosis Date Noted  . Headache 07/06/2016  . Allergic rhinitis 07/06/2016  . Reactive airway disease 03/30/2016  . Bilateral elbow joint pain 12/23/2015  . Pre-diabetes 12/13/2015  . Vitamin D deficiency 12/13/2015  . Elevated LDL cholesterol level 12/13/2015  . Back pain   . Hypothyroid 01/11/2012  . Elevated WBCs 01/11/2012  . Obesity 01/11/2012    [name] PT 08/07/2016, 10:32 PM  [facility] [address] [place], [address] Phone: [phone]   Fax:  [phone]  Name: Tina Moreno MRN: [id number] Date of Birth: May 31, 1966

## 2016-08-10 ENCOUNTER — Encounter: Payer: Self-pay | Admitting: Physical Therapy

## 2016-08-10 ENCOUNTER — Ambulatory Visit: Payer: BLUE CROSS/BLUE SHIELD | Admitting: Physical Therapy

## 2016-08-10 DIAGNOSIS — M62838 Other muscle spasm: Secondary | ICD-10-CM | POA: Diagnosis not present

## 2016-08-10 DIAGNOSIS — M546 Pain in thoracic spine: Secondary | ICD-10-CM

## 2016-08-10 DIAGNOSIS — M6281 Muscle weakness (generalized): Secondary | ICD-10-CM | POA: Diagnosis not present

## 2016-08-11 NOTE — Therapy (Signed)
[place] [facility] [facility] [address], [place], [address] Phone: [phone]   Fax:  [phone]  Physical Therapy Treatment  Patient Details  Name: Tina Moreno MRN: [id number] Date of Birth: 01-Jun-1966 Referring Provider: Leone Haven MD  Encounter Date: 08/10/2016      PT End of Session - 08/10/16 1955    Visit Number 4   Number of Visits 16   Date for PT Re-Evaluation 09/24/16   PT Start Time 1850   PT Stop Time 1955   PT Time Calculation (min) 65 min   Activity Tolerance Patient tolerated treatment well   Behavior During Therapy [facility] for tasks assessed/performed      Past Medical History:  Diagnosis Date  . Abnormal Pap smear of cervix   . Anxiety   . Back pain   . Cervical dysplasia   . Depression   . Elevated WBCs    NEGATIVE HEM W/U  . GERD (gastroesophageal reflux disease)   . Hypothyroid     Past Surgical History:  Procedure Laterality Date  . COLPOSCOPY    . GYNECOLOGIC CRYOSURGERY      There were no vitals filed for this visit.      Subjective Assessment - 08/10/16 1855    Subjective today is not too bad. having pain in left hip.    Limitations Sitting;Standing;Walking;House hold activities;Other (comment)  vacuuming and cooking   Patient Stated Goals decrease pain as much as possible, to manage self    Currently in Pain? Yes   Pain Score 3    Pain Location Back   Pain Orientation Right;Left;Mid;Lower   Pain Descriptors / Indicators Tightness   Pain Type Chronic pain   Pain Onset More than a month ago      Objective: Palpation: spasms cervical spine suboccipital and upper trapezius muscles, along right lower back, QL muscle  Treatment:  Manual therapy: STM cervical spine paraspinal muscles, suboccipital release and upper trapezius stretches 3 x 20 reps with patient supine lying/ seated in massage chair; QL STM/release performed in side lying: goal: reduce spasms ,  pain  Therapeutic exercise: patient performed with VC, tactile cuing and demonstration of therapist:  Supine pectoral stretch with LTR 3 x 10 seconds Standing scapular rows high/low performed at door with resistive tubing x 15 reps each  Modalities:  Electrical stimulation: high volt (4) electrodes applied to each side upper/mid trapezius muscles and lower back right/left lumbar spine with patient seated in massage chair: muscle spasm clinical protocol; patient seated with moist heat applied to same during treatment x 20 min.; goal; pain and reduction of spasms  Patient response to treatment: Patient with decreased spasms and improved soft tissue elasticity in cervical spine and lower back by 30% with STM and stretches. Demonstrated good technique with exercises following demonstration and with VC. No adverse reaction to heat or estim. And patient reported feeling less stiff and more relaxed at end of session.        PT Education - 08/10/16 2000    Education provided Yes   Education Details HEP: resistive band scapular rows high, low   Person(s) Educated Patient   Methods Explanation;Demonstration;Verbal cues;Handout   Comprehension Verbalized understanding;Returned demonstration;Verbal cues required             PT Long Term Goals - 07/30/16 0915      PT LONG TERM GOAL #1   Title Patient will report max pain level of 5/10 with aggravating factors  of sitting, standing by 09/24/2016 demonstrating improved function   Baseline pain level max 9/10   Status New     PT LONG TERM GOAL #2   Title MODI improved to <20% self perceived impairment demonstrating improved function with dialy tasks, sitting, work, baking, sleeping by 09/24/2016   Baseline MODI 30%   Status New     PT LONG TERM GOAL #3   Title Independent with home program for self managment of pain, flexibility/strengthening to prevent re occurrence of symptoms by discharge 09/24/2016   Baseline limited to no knowledge of  appropriate pain control strategies, exercise progression, requires max. guidance, instruction/cuing   Status New               Plan - 08/10/16 2000    Clinical Impression Statement Patient demonstrated improved flexiblity , decreased spasms in cervical spine/upper trapezius muscles and was able to progress exercises with VC and demonstration.    Rehab Potential Good   PT Frequency 2x / week   PT Duration 8 weeks   PT Treatment/Interventions Iontophoresis 4mg /ml Dexamethasone;Electrical Stimulation;Patient/family education;Cryotherapy;Neuromuscular re-education;Manual techniques;Therapeutic exercise;Dry needling;Moist Heat;Ultrasound   PT Next Visit Plan pain control, therapeutic exercise   PT Home Exercise Plan posture exercises, use of cervical and lumbar supports for sleep/sitting, break every hour at work to correct posture      Patient will benefit from skilled therapeutic intervention in order to improve the following deficits and impairments:  Postural dysfunction, Decreased strength, Improper body mechanics, Pain, Impaired perceived functional ability, Increased muscle spasms, Decreased range of motion  Visit Diagnosis: Pain in thoracic spine  Muscle weakness (generalized)  Other muscle spasm     Problem List Patient Active Problem List   Diagnosis Date Noted  . Headache 07/06/2016  . Allergic rhinitis 07/06/2016  . Reactive airway disease 03/30/2016  . Bilateral elbow joint pain 12/23/2015  . Pre-diabetes 12/13/2015  . Vitamin D deficiency 12/13/2015  . Elevated LDL cholesterol level 12/13/2015  . Back pain   . Hypothyroid 01/11/2012  . Elevated WBCs 01/11/2012  . Obesity 01/11/2012    [name] PT 08/11/2016, 11:27 PM  [place] [facility] [address] [place], [address] Phone: [phone]   Fax:  [phone]  Name: Tina Moreno MRN: [id number] Date of Birth:  [birth year]

## 2016-08-12 ENCOUNTER — Encounter: Payer: BLUE CROSS/BLUE SHIELD | Admitting: Physical Therapy

## 2016-08-17 ENCOUNTER — Encounter: Payer: BLUE CROSS/BLUE SHIELD | Admitting: Physical Therapy

## 2016-08-17 ENCOUNTER — Encounter: Payer: Self-pay | Admitting: Physical Therapy

## 2016-08-17 ENCOUNTER — Ambulatory Visit: Payer: BLUE CROSS/BLUE SHIELD | Admitting: Physical Therapy

## 2016-08-17 DIAGNOSIS — M62838 Other muscle spasm: Secondary | ICD-10-CM

## 2016-08-17 DIAGNOSIS — M6281 Muscle weakness (generalized): Secondary | ICD-10-CM

## 2016-08-17 DIAGNOSIS — M546 Pain in thoracic spine: Secondary | ICD-10-CM

## 2016-08-17 NOTE — Therapy (Signed)
[place] [facility] [address] [place], [address] Phone: [phone]   Fax:  [phone]  Physical Therapy Treatment  Patient Details  Name: Tina Moreno MRN: [id number] Date of Birth: 07-29-1966 Referring Provider: Leone Haven MD  Encounter Date: 08/17/2016      PT End of Session - 08/17/16 1957    Visit Number 5   Number of Visits 16   Date for PT Re-Evaluation 09/24/16   PT Start Time 1905   PT Stop Time 1948   PT Time Calculation (min) 43 min   Activity Tolerance Patient tolerated treatment well   Behavior During Therapy [facility] for tasks assessed/performed      Past Medical History:  Diagnosis Date  . Abnormal Pap smear of cervix   . Anxiety   . Back pain   . Cervical dysplasia   . Depression   . Elevated WBCs    NEGATIVE HEM W/U  . GERD (gastroesophageal reflux disease)   . Hypothyroid     Past Surgical History:  Procedure Laterality Date  . COLPOSCOPY    . GYNECOLOGIC CRYOSURGERY      There were no vitals filed for this visit.      Subjective Assessment - 08/17/16 1910    Subjective Patient reports she is doing ok today. Pain is located in mid back today. She walked over the weekend and did better than when she first began therapy.   Limitations Sitting;Standing;Walking;House hold activities;Other (comment)  cooking, vacuuming   Patient Stated Goals decrease pain as much as possible, to manage self    Currently in Pain? Yes   Pain Score 3    Pain Location Back   Pain Orientation Left;Right;Mid   Pain Descriptors / Indicators Aching;Tightness   Pain Type Chronic pain   Pain Onset More than a month ago         Objective: Palpation: spasms cervical spine suboccipital and upper trapezius muscles, TrP left lower thoracic spine Posture: bilateral hiking of shoulders prior to treatment  Treatment:  Manual therapy: STM cervical spine paraspinal muscles, suboccipital  release and upper trapezius stretches 3 x 20 reps with patient supine lying/ seated in massage chair: goal: reduce spasms , pain  Therapeutic exercise: patient performed with VC, tactile cuing and demonstration of therapist:  Standing scapular rows high/low performed at door with resistive tubing x 15 reps each Supine lying latissimus stretch with assistance of therapist 3 x 10 seconds Scapular adduction x 10 at door frame  Modalities:  Ultrasound: TrP x 3 min. Left thoracic spine paraspinal muscle 0.8w/cm2.,  20% pulsed static application with patient seated in massage chair Electrical stimulation: high volt (4) electrodes applied to each side upper/mid trapezius muscles and lower back right/left lumbar spine with patient seated in massage chair: muscle spasm clinical protocol; patient seated with moist heat applied to same during treatment x 15 min.; goal; pain and reduction of spasms  Patient response to treatment: decreased spasms and tenderness over trigger point by 30% following STM/modalities. Patient demonstrated good technique with exercises following demonstration and with verbal cues for correct positioning/alignment.No adverse reaction to heat or estim.         PT Education - 08/17/16 1930    Education provided Yes   Education Details HEP: reassessed exercises with resistive band   Person(s) Educated Patient   Methods Explanation;Demonstration;Verbal cues   Comprehension Verbalized understanding;Verbal cues required;Returned demonstration  PT Long Term Goals - 07/30/16 0915      PT LONG TERM GOAL #1   Title Patient will report max pain level of 5/10 with aggravating factors of sitting, standing by 09/24/2016 demonstrating improved function   Baseline pain level max 9/10   Status New     PT LONG TERM GOAL #2   Title MODI improved to <20% self perceived impairment demonstrating improved function with dialy tasks, sitting, work, baking, sleeping by 09/24/2016    Baseline MODI 30%   Status New     PT LONG TERM GOAL #3   Title Independent with home program for self managment of pain, flexibility/strengthening to prevent re occurrence of symptoms by discharge 09/24/2016   Baseline limited to no knowledge of appropriate pain control strategies, exercise progression, requires max. guidance, instruction/cuing   Status New               Plan - 08/17/16 1950    Clinical Impression Statement Patient demonstrated improved technique with exercises following demonstration and with repetition and verbal cuing. She is responding well to current treatment for pain control, muscle spasm reduction and progressive exercise and should continue to progress towards goals with continued physical therapy intervention.    Rehab Potential Good   PT Frequency 2x / week   PT Duration 8 weeks   PT Treatment/Interventions Iontophoresis 4mg /ml Dexamethasone;Electrical Stimulation;Patient/family education;Cryotherapy;Neuromuscular re-education;Manual techniques;Therapeutic exercise;Dry needling;Moist Heat;Ultrasound   PT Next Visit Plan pain control, therapeutic exercise   PT Home Exercise Plan posture exercises, use of cervical and lumbar supports for sleep/sitting, break every hour at work to correct posture      Patient will benefit from skilled therapeutic intervention in order to improve the following deficits and impairments:  Postural dysfunction, Decreased strength, Improper body mechanics, Pain, Impaired perceived functional ability, Increased muscle spasms, Decreased range of motion  Visit Diagnosis: Pain in thoracic spine  Muscle weakness (generalized)  Other muscle spasm     Problem List Patient Active Problem List   Diagnosis Date Noted  . Headache 07/06/2016  . Allergic rhinitis 07/06/2016  . Reactive airway disease 03/30/2016  . Bilateral elbow joint pain 12/23/2015  . Pre-diabetes 12/13/2015  . Vitamin D deficiency 12/13/2015  . Elevated  LDL cholesterol level 12/13/2015  . Back pain   . Hypothyroid 01/11/2012  . Elevated WBCs 01/11/2012  . Obesity 01/11/2012    [name] PT 08/18/2016, 10:23 PM  [place] [facility] [address], [place], [address] Phone: [phone]   Fax:  [phone]  Name: Tina Moreno MRN: [id number] Date of Birth: June 05, 1966

## 2016-08-19 ENCOUNTER — Encounter: Payer: BLUE CROSS/BLUE SHIELD | Admitting: Physical Therapy

## 2016-08-20 ENCOUNTER — Encounter: Payer: Self-pay | Admitting: Physical Therapy

## 2016-08-20 ENCOUNTER — Ambulatory Visit: Payer: BLUE CROSS/BLUE SHIELD | Admitting: Physical Therapy

## 2016-08-20 DIAGNOSIS — M546 Pain in thoracic spine: Secondary | ICD-10-CM

## 2016-08-20 DIAGNOSIS — M62838 Other muscle spasm: Secondary | ICD-10-CM | POA: Diagnosis not present

## 2016-08-20 DIAGNOSIS — M6281 Muscle weakness (generalized): Secondary | ICD-10-CM

## 2016-08-20 NOTE — Therapy (Signed)
[place] [facility] [address] [place], [address] Phone: [phone]   Fax:  [phone]  Physical Therapy Treatment  Patient Details  Name: Tina Moreno MRN: [id number] Date of Birth: 1966/06/16 Referring Provider: Leone Haven MD  Encounter Date: 08/20/2016      PT End of Session - 08/20/16 1908    Visit Number 6   Number of Visits 16   Date for PT Re-Evaluation 09/24/16   PT Start Time 1903   PT Stop Time 1958   PT Time Calculation (min) 55 min   Activity Tolerance Patient tolerated treatment well   Behavior During Therapy [facility] for tasks assessed/performed      Past Medical History:  Diagnosis Date  . Abnormal Pap smear of cervix   . Anxiety   . Back pain   . Cervical dysplasia   . Depression   . Elevated WBCs    NEGATIVE HEM W/U  . GERD (gastroesophageal reflux disease)   . Hypothyroid     Past Surgical History:  Procedure Laterality Date  . COLPOSCOPY    . GYNECOLOGIC CRYOSURGERY      There were no vitals filed for this visit.      Subjective Assessment - 08/20/16 1905    Subjective steady improvement noted with back pain improving.    Limitations Sitting;Standing;Walking;House hold activities;Other (comment)  cooking, vacuuming   Patient Stated Goals decrease pain as much as possible, to manage self    Currently in Pain? Yes   Pain Score 2    Pain Location Back   Pain Orientation Left;Right;Mid   Pain Descriptors / Indicators Aching;Tightness   Pain Type Chronic pain   Pain Onset More than a month ago        Objective: Palpation: spasms bilateral upper trapezius muscles, TrP left lower thoracic spine Posture: bilateral hiking of shoulders prior to treatment AROM: cervical spine WNL all planes  Treatment:  Manual therapy: STM upper trapezius with stretches 3 x 20 reps with patient seated in chair: goal: reduce spasms , pain; STM along bilateral paraspinal muscles  cervical/thoracic spine with patient in massage chair  Therapeutic exercise: patient performed with VC, tactile cuing and demonstration of therapist:  Supine lying latissimus stretch with assistance of therapist 3 x 10 seconds Seated thoracic spine extension over back of chair x 10 reps   Modalities:  Ultrasound: TrP x 3 min. Left thoracic spine paraspinal muscle 0.8w/cm2.,  20% pulsed static application with patient seated in massage chair Electrical stimulation: high volt (4) electrodes applied to each side upper/mid trapezius muscles and lower back right/left lumbar spinewith patient seated in massage chair: muscle spasm clinical protocol; patient seated with moist heat applied to same during treatment x 15 min.; goal; pain and reduction of spasms  Patient response to treatment: decreased TrP to non palpable following [place], improved soft tissue elasticity with improved ROM with less stiffness in right shoulder flexion, cervical spine rotation, side bend and flexion/extension as compared to previous session, patient demonstrated good technique with stretches for thoracic spine, shoulders with good understanding of home program. No adverse reaction to heat or estim.         PT Education - 08/20/16 1907    Education provided Yes   Education Details HEP: reviewed exercises verbally   Person(s) Educated Patient   Methods Explanation   Comprehension Verbalized understanding             PT Long Term Goals -  07/30/16 0915      PT LONG TERM GOAL #1   Title Patient will report max pain level of 5/10 with aggravating factors of sitting, standing by 09/24/2016 demonstrating improved function   Baseline pain level max 9/10   Status New     PT LONG TERM GOAL #2   Title MODI improved to <20% self perceived impairment demonstrating improved function with dialy tasks, sitting, work, baking, sleeping by 09/24/2016   Baseline MODI 30%   Status New     PT LONG TERM GOAL #3   Title  Independent with home program for self managment of pain, flexibility/strengthening to prevent re occurrence of symptoms by discharge 09/24/2016   Baseline limited to no knowledge of appropriate pain control strategies, exercise progression, requires max. guidance, instruction/cuing   Status New               Plan - 08/20/16 1909    Clinical Impression Statement Progressing well with goals. Demonstrated decreased TrP to non palpable with [place] and manual therapy. Demonstrates improved pain level and posture since beginning therapy indicating good carry over between session and compliance with home program   Rehab Potential Good   PT Frequency 2x / week   PT Duration 8 weeks   PT Treatment/Interventions Iontophoresis 4mg /ml Dexamethasone;Electrical Stimulation;Patient/family education;Cryotherapy;Neuromuscular re-education;Manual techniques;Therapeutic exercise;Dry needling;Moist Heat;Ultrasound   PT Next Visit Plan pain control, therapeutic exercise   PT Home Exercise Plan posture exercises, use of cervical and lumbar supports for sleep/sitting, break every hour at work to correct posture      Patient will benefit from skilled therapeutic intervention in order to improve the following deficits and impairments:  Postural dysfunction, Decreased strength, Improper body mechanics, Pain, Impaired perceived functional ability, Increased muscle spasms, Decreased range of motion  Visit Diagnosis: Pain in thoracic spine  Muscle weakness (generalized)  Other muscle spasm     Problem List Patient Active Problem List   Diagnosis Date Noted  . Headache 07/06/2016  . Allergic rhinitis 07/06/2016  . Reactive airway disease 03/30/2016  . Bilateral elbow joint pain 12/23/2015  . Pre-diabetes 12/13/2015  . Vitamin D deficiency 12/13/2015  . Elevated LDL cholesterol level 12/13/2015  . Back pain   . Hypothyroid 01/11/2012  . Elevated WBCs 01/11/2012  . Obesity 01/11/2012    [name]  PT 08/20/2016, 11:03 PM  [place] [facility] [address], [place], [address] Phone: [phone]   Fax:  [phone]  Name: Tina Moreno MRN: [id number] Date of Birth: 05-Sep-1966

## 2016-08-24 ENCOUNTER — Ambulatory Visit: Payer: BLUE CROSS/BLUE SHIELD | Attending: Family Medicine | Admitting: Physical Therapy

## 2016-08-24 ENCOUNTER — Encounter: Payer: Self-pay | Admitting: Physical Therapy

## 2016-08-24 DIAGNOSIS — M546 Pain in thoracic spine: Secondary | ICD-10-CM | POA: Insufficient documentation

## 2016-08-24 DIAGNOSIS — M6281 Muscle weakness (generalized): Secondary | ICD-10-CM | POA: Diagnosis not present

## 2016-08-24 DIAGNOSIS — M62838 Other muscle spasm: Secondary | ICD-10-CM | POA: Insufficient documentation

## 2016-08-24 NOTE — Therapy (Signed)
[place] [facility] [address], [place], [address] Phone: [phone]   Fax:  [phone]  Physical Therapy Treatment  Patient Details  Name: Tina Moreno MRN: [id number] Date of Birth: [birth year] Referring Provider: Leone Haven MD  Encounter Date: 08/24/2016      PT End of Session - 08/24/16 1920    Visit Number 7   Number of Visits 16   Date for PT Re-Evaluation 09/24/16   PT Start Time [id number]   PT Stop Time 1915   PT Time Calculation (min) 42 min   Activity Tolerance Patient tolerated treatment well   Behavior During Therapy [facility] for tasks assessed/performed      Past Medical History:  Diagnosis Date  . Abnormal Pap smear of cervix   . Anxiety   . Back pain   . Cervical dysplasia   . Depression   . Elevated WBCs    NEGATIVE HEM W/U  . GERD (gastroesophageal reflux disease)   . Hypothyroid     Past Surgical History:  Procedure Laterality Date  . COLPOSCOPY    . GYNECOLOGIC CRYOSURGERY      There were no vitals filed for this visit.      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 at a time. Patient reports self treatment with rest, suboccipital release and hot shower and ibuprofen.    Limitations Sitting;Standing;Walking;House hold activities;Other (comment)  cooking, vacuuming   Patient Stated Goals decrease pain as much as possible, to manage self    Currently in Pain? Yes   Pain Score 3    Pain Location Back   Pain Orientation Left;Right;Mid   Pain Descriptors / Indicators Aching;Tightness   Pain Type Chronic pain   Pain Onset More than a month ago   Pain Frequency Intermittent        Objective: Palpation: spasms bilateral upper trapezius muscles, TrP right mid to lower thoracic spine   Treatment:  Manual therapy: STM  upper trapezius with stretches 3 x 20 reps with patient supine lying, suboccipital release: goal: reduce spasms , pain; STM along bilateral paraspinal muscles cervical/thoracic/lumbar spine with patient in massage chair  Therapeutic exercise: patient performed with VC, tactile cuing and demonstration of therapist:  Supine lying latissimus stretch with assistance of therapist 3 x 10 seconds AAROM cervical spine rotations and side bending x 3 reps each Seated thoracic spine extension over back of chair x 10 reps   Modalities:  Ultrasound/combination with high volt and [place] 1.3w/cm2 50% pulsed x 10 min. thoracic spine paraspinal muscle right side  with patient seated in massage chair  Patient response to treatment: Patient with decreased spasms and improved soft tissue elasticity to WNL and no palpable spasms following US/estim. Combination.  improved soft tissue elasticity with improved ROM with less stiffness following STM. Good understanding of home exercises and stretches.         PT Education - 08/24/16 1923    Education provided Yes   Education Details HEP: continue with scapulr retraction, stretches for thoracic spine, lats and use resistive band for strengthening   Person(s) Educated Patient   Methods Explanation;Demonstration;Verbal cues   Comprehension Verbalized understanding;Returned demonstration;Verbal cues required             PT Long Term Goals - 07/30/16 0915  PT LONG TERM GOAL #1   Title Patient will report max pain level of 5/10 with aggravating factors of sitting, standing by 09/24/2016 demonstrating improved function   Baseline pain level max 9/10   Status New     PT LONG TERM GOAL #2   Title MODI improved to <20% self perceived impairment demonstrating improved function with dialy tasks, sitting, work, baking, sleeping by 09/24/2016   Baseline MODI 30%   Status New     PT LONG TERM GOAL #3   Title Independent with home program for self managment of  pain, flexibility/strengthening to prevent re occurrence of symptoms by discharge 09/24/2016   Baseline limited to no knowledge of appropriate pain control strategies, exercise progression, requires max. guidance, instruction/cuing   Status New               Plan - 08/24/16 1930    Clinical Impression Statement Decreased spasms and pain following treament progressing well with current treatment. Demonstrated at least 50% decreased spasms/tenderness along right side mid to lower back and much improved following treatment. Patient continues with spasms and pain that limit function with standing, prolonged sitting and will benefit from continued physical therapy intervention.    Rehab Potential Good   PT Frequency 2x / week   PT Duration 8 weeks   PT Treatment/Interventions Iontophoresis 4mg /ml Dexamethasone;Electrical Stimulation;Patient/family education;Cryotherapy;Neuromuscular re-education;Manual techniques;Therapeutic exercise;Dry needling;Moist Heat;Ultrasound   PT Next Visit Plan pain control, therapeutic exercise   PT Home Exercise Plan posture exercises, use of cervical and lumbar supports for sleep/sitting, break every hour at work to correct posture      Patient will benefit from skilled therapeutic intervention in order to improve the following deficits and impairments:  Postural dysfunction, Decreased strength, Improper body mechanics, Pain, Impaired perceived functional ability, Increased muscle spasms, Decreased range of motion  Visit Diagnosis: Pain in thoracic spine  Muscle weakness (generalized)  Other muscle spasm     Problem List Patient Active Problem List   Diagnosis Date Noted  . Headache 07/06/2016  . Allergic rhinitis 07/06/2016  . Reactive airway disease 03/30/2016  . Bilateral elbow joint pain 12/23/2015  . Pre-diabetes 12/13/2015  . Vitamin D deficiency 12/13/2015  . Elevated LDL cholesterol level 12/13/2015  . Back pain   . Hypothyroid 01/11/2012   . Elevated WBCs 01/11/2012  . Obesity 01/11/2012    Tina Moreno PT 08/25/2016, 10:31 PM  [place] [facility] [address] [place], [address] Phone: [phone]   Fax:  [phone]  Name: Tina Moreno MRN: [id number] Date of Birth: 1965-10-22

## 2016-08-25 ENCOUNTER — Encounter: Payer: BLUE CROSS/BLUE SHIELD | Admitting: Physical Therapy

## 2016-08-27 ENCOUNTER — Encounter: Payer: BLUE CROSS/BLUE SHIELD | Admitting: Physical Therapy

## 2016-08-27 ENCOUNTER — Ambulatory Visit: Payer: BLUE CROSS/BLUE SHIELD | Admitting: Physical Therapy

## 2016-08-27 ENCOUNTER — Encounter: Payer: Self-pay | Admitting: Physical Therapy

## 2016-08-27 DIAGNOSIS — M546 Pain in thoracic spine: Secondary | ICD-10-CM

## 2016-08-27 DIAGNOSIS — M6281 Muscle weakness (generalized): Secondary | ICD-10-CM | POA: Diagnosis not present

## 2016-08-27 DIAGNOSIS — M62838 Other muscle spasm: Secondary | ICD-10-CM

## 2016-08-28 NOTE — Therapy (Signed)
[facility] [address], [place], [address] Phone: [phone]   Fax:  [phone]  Physical Therapy Treatment  Patient Details  Name: Tina Moreno MRN: [id number] Date of Birth: [birth year] Referring Provider: Leone Haven MD  Encounter Date: 08/27/2016      PT End of Session - 08/27/16 1913    Visit Number 8   Number of Visits 16   Date for PT Re-Evaluation 09/24/16   PT Start Time 1906   PT Stop Time 1950   PT Time Calculation (min) 44 min   Activity Tolerance Patient tolerated treatment well   Behavior During Therapy [facility] for tasks assessed/performed      Past Medical History:  Diagnosis Date  . Abnormal Pap smear of cervix   . Anxiety   . Back pain   . Cervical dysplasia   . Depression   . Elevated WBCs    NEGATIVE HEM W/U  . GERD (gastroesophageal reflux disease)   . Hypothyroid     Past Surgical History:  Procedure Laterality Date  . COLPOSCOPY    . GYNECOLOGIC CRYOSURGERY      There were no vitals filed for this visit.      Subjective Assessment - 08/27/16 1908    Subjective Patient reports she has not had time to exercise due to work and being tired. She has been able to stand and walk further with less pain in back than when she first began physical therapy treatment.    Limitations Sitting;Standing;Walking;House hold activities;Other (comment)   Patient Stated Goals decrease pain as much as possible, to manage self    Currently in Pain? Yes   Pain Score 3    Pain Location Back   Pain Orientation Upper;Lower   Pain Descriptors / Indicators Aching   Pain Type Chronic pain   Pain Onset More than a month ago   Pain Frequency Intermittent      Objective: Palpation: spasms bilateral upper trapezius muscles, TrP right mid to lower thoracic spine   Treatment:  Manual therapy: STM upper trapezius with stretches 3 x 20 reps with patient supine lying, suboccipital  release: goal: reduce spasms , pain; STM along bilateral paraspinal muscles cervical/thoracic/lumbar spine with patient in massage chair  Modalities:  Ultrasound/combination with high volt and [place] 1.3w/cm2 50% pulsed x 15 min. Thoracic and lumbar spine paraspinal muscles right and left side  with patient seated in massage chair  Patient response to treatment: Patient with decreased spasms and improved soft tissue elasticity by 50% following treatment. Reported decreased pain to mild/none at end of session.       PT Education - 08/27/16 1911    Education provided Yes   Education Details HEP: re inforced performing home program to see lasting results   Person(s) Educated Patient   Methods Demonstration;Verbal cues;Explanation   Comprehension Verbalized understanding;Returned demonstration;Verbal cues required             PT Long Term Goals - 07/30/16 0915      PT LONG TERM GOAL #1   Title Patient will report max pain level of 5/10 with aggravating factors of sitting, standing by 09/24/2016 demonstrating improved function   Baseline pain level max 9/10   Status New     PT LONG TERM GOAL #2   Title MODI improved to <20% self perceived impairment demonstrating improved function with dialy tasks, sitting, work, baking, sleeping by 09/24/2016   Baseline MODI 30%   Status  New     PT LONG TERM GOAL #3   Title Independent with home program for self managment of pain, flexibility/strengthening to prevent re occurrence of symptoms by discharge 09/24/2016   Baseline limited to no knowledge of appropriate pain control strategies, exercise progression, requires max. guidance, instruction/cuing   Status New               Plan - 08/27/16 1950    Clinical Impression Statement Patient demonstrates improved pain and soft tissue elasiticity with current treatment. She is progressing with goals and able to walk with less difficulty and pain in back. She continues with exacerbation of symptoms  and will require additional physical therapy intervention to achieve goals.    PT Frequency 2x / week   PT Duration 8 weeks   PT Treatment/Interventions Iontophoresis 4mg /ml Dexamethasone;Electrical Stimulation;Patient/family education;Cryotherapy;Neuromuscular re-education;Manual techniques;Therapeutic exercise;Dry needling;Moist Heat;Ultrasound   PT Next Visit Plan pain control, therapeutic exercise   PT Home Exercise Plan posture exercises, use of cervical and lumbar supports for sleep/sitting, break every hour at work to correct posture      Patient will benefit from skilled therapeutic intervention in order to improve the following deficits and impairments:  Postural dysfunction, Decreased strength, Improper body mechanics, Pain, Impaired perceived functional ability, Increased muscle spasms, Decreased range of motion  Visit Diagnosis: Pain in thoracic spine  Muscle weakness (generalized)  Other muscle spasm     Problem List Patient Active Problem List   Diagnosis Date Noted  . Headache 07/06/2016  . Allergic rhinitis 07/06/2016  . Reactive airway disease 03/30/2016  . Bilateral elbow joint pain 12/23/2015  . Pre-diabetes 12/13/2015  . Vitamin D deficiency 12/13/2015  . Elevated LDL cholesterol level 12/13/2015  . Back pain   . Hypothyroid 01/11/2012  . Elevated WBCs 01/11/2012  . Obesity 01/11/2012    [name] PT 08/28/2016, 7:08 PM  [facility] [address] [place], [address] Phone: [phone]   Fax:  [phone]  Name: Tina Moreno MRN: [id number] Date of Birth: [birth year]

## 2016-08-31 ENCOUNTER — Ambulatory Visit: Payer: BLUE CROSS/BLUE SHIELD | Admitting: Physical Therapy

## 2016-08-31 ENCOUNTER — Encounter: Payer: Self-pay | Admitting: Physical Therapy

## 2016-08-31 DIAGNOSIS — M6281 Muscle weakness (generalized): Secondary | ICD-10-CM

## 2016-08-31 DIAGNOSIS — M62838 Other muscle spasm: Secondary | ICD-10-CM

## 2016-08-31 DIAGNOSIS — M546 Pain in thoracic spine: Secondary | ICD-10-CM

## 2016-08-31 NOTE — Therapy (Signed)
[facility] [address] [place], [address] Phone: [phone]   Fax:  [phone]  Physical Therapy Treatment  Patient Details  Name: SHAKA TANEY MRN: [id number] Date of Birth: 1966/08/04 Referring Provider: Leone Haven MD  Encounter Date: 08/31/2016      PT End of Session - 08/31/16 1843    Visit Number 9   Number of Visits 16   Date for PT Re-Evaluation 09/24/16   PT Start Time 1840   PT Stop Time 1933   PT Time Calculation (min) 53 min   Activity Tolerance Patient tolerated treatment well   Behavior During Therapy [facility] for tasks assessed/performed      Past Medical History:  Diagnosis Date  . Abnormal Pap smear of cervix   . Anxiety   . Back pain   . Cervical dysplasia   . Depression   . Elevated WBCs    NEGATIVE HEM W/U  . GERD (gastroesophageal reflux disease)   . Hypothyroid     Past Surgical History:  Procedure Laterality Date  . COLPOSCOPY    . GYNECOLOGIC CRYOSURGERY      There were no vitals filed for this visit.      Subjective Assessment - 08/31/16 1840    Subjective Patient reports she is    Limitations Sitting;Standing;Walking;House hold activities;Other (comment)   Patient Stated Goals decrease pain as much as possible, to manage self    Currently in Pain? Yes   Pain Score 3    Pain Location Back   Pain Orientation Upper;Lower   Pain Descriptors / Indicators Aching   Pain Type Chronic pain   Pain Onset More than a month ago   Pain Frequency Intermittent        Objective: Palpation: + tenderness with mild spasms palpable along right thoracic spine, moderate spasms/increased tone suboccipitally with tenderness right>left side   Treatment:  Manual therapy: STM upper trapezius with stretches 3 x 20 reps with patient supine lying, suboccipital release: goal: reduce spasms , pain;  Modalities:  Ultrasound/combination with high volt and [place] 1.3w/cm2  50% pulsed x 15 min. Thoracic and lumbar spine paraspinal muscles right and left side with patient seated in massage chair Therapeutic exercises: patient performed with verbal, tactile cues and demonstration of therapist: Side lying:  Clam x 10 reps each LE Prone hip extension x 10 reps each LE Supine piriformis stretch bilateral with assistance 3 reps 10 second holds Gluteal stretch left LE instructed, performed 2 reps 10 second holds Sitting slouch/correct x 10 reps with concentration on back extensor recruitment  Patient response to treatment: Patient improved technique with exercises with VC and repetition. Decreased spasms in thoracic spine right side to mild following US/estim. Combination. Verbalized good understanding of home exercises with written instructions given.          PT Long Term Goals - 07/30/16 0915      PT LONG TERM GOAL #1   Title Patient will report max pain level of 5/10 with aggravating factors of sitting, standing by 09/24/2016 demonstrating improved function   Baseline pain level max 9/10   Status New     PT LONG TERM GOAL #2   Title MODI improved to <20% self perceived impairment demonstrating improved function with dialy tasks, sitting, work, baking, sleeping by 09/24/2016   Baseline MODI 30%   Status New     PT LONG TERM GOAL #3   Title Independent with home program for self  managment of pain, flexibility/strengthening to prevent re occurrence of symptoms by discharge 09/24/2016   Baseline limited to no knowledge of appropriate pain control strategies, exercise progression, requires max. guidance, instruction/cuing   Status New               Plan - 08/31/16 1844    Clinical Impression Statement Patient is progressing well with current treatment with decreased pain in back and improving function with siting/standing activties. She continues with intermittent spasms and tenderness in lower back and thoracic spine right>left side, weakness in left hip  and will benefit form additional physical therapy interveniton to achieve lasting results and be able to self manage symptoms.    Rehab Potential Good   PT Frequency 2x / week   PT Duration 8 weeks   PT Treatment/Interventions Iontophoresis 4mg /ml Dexamethasone;Electrical Stimulation;Patient/family education;Cryotherapy;Neuromuscular re-education;Manual techniques;Therapeutic exercise;Dry needling;Moist Heat;Ultrasound   PT Next Visit Plan pain control, therapeutic exercise   PT Home Exercise Plan posture exercises, use of cervical and lumbar supports for sleep/sitting, break every hour at work to correct posture      Patient will benefit from skilled therapeutic intervention in order to improve the following deficits and impairments:  Postural dysfunction, Decreased strength, Improper body mechanics, Pain, Impaired perceived functional ability, Increased muscle spasms, Decreased range of motion  Visit Diagnosis: Pain in thoracic spine  Muscle weakness (generalized)  Other muscle spasm     Problem List Patient Active Problem List   Diagnosis Date Noted  . Headache 07/06/2016  . Allergic rhinitis 07/06/2016  . Reactive airway disease 03/30/2016  . Bilateral elbow joint pain 12/23/2015  . Pre-diabetes 12/13/2015  . Vitamin D deficiency 12/13/2015  . Elevated LDL cholesterol level 12/13/2015  . Back pain   . Hypothyroid 01/11/2012  . Elevated WBCs 01/11/2012  . Obesity 01/11/2012    [name] PT 09/01/2016, 1:20 PM  [facility] [address] [place], [address] Phone: [phone]   Fax:  [phone]  Name: SEPTEMBER BRIZZOLARA MRN: [id number] Date of Birth: 1966-04-06

## 2016-09-03 ENCOUNTER — Ambulatory Visit: Payer: BLUE CROSS/BLUE SHIELD | Admitting: Physical Therapy

## 2016-09-03 ENCOUNTER — Encounter: Payer: Self-pay | Admitting: Physical Therapy

## 2016-09-03 DIAGNOSIS — M546 Pain in thoracic spine: Secondary | ICD-10-CM

## 2016-09-03 DIAGNOSIS — F331 Major depressive disorder, recurrent, moderate: Secondary | ICD-10-CM | POA: Diagnosis not present

## 2016-09-03 DIAGNOSIS — M6281 Muscle weakness (generalized): Secondary | ICD-10-CM | POA: Diagnosis not present

## 2016-09-03 DIAGNOSIS — M62838 Other muscle spasm: Secondary | ICD-10-CM | POA: Diagnosis not present

## 2016-09-04 NOTE — Therapy (Signed)
[facility] [address] [place], [address] Phone: [phone]   Fax:  [phone]  Physical Therapy Treatment  Patient Details  Name: Tina Moreno MRN: [id number] Date of Birth: June 19, 1966 Referring Provider: Leone Haven MD  Encounter Date: 09/03/2016      PT End of Session - 09/03/16 2000    Visit Number 10   Number of Visits 16   Date for PT Re-Evaluation 09/24/16   PT Start Time 1900   PT Stop Time 1950   PT Time Calculation (min) 50 min   Activity Tolerance Patient tolerated treatment well   Behavior During Therapy [facility] for tasks assessed/performed      Past Medical History:  Diagnosis Date  . Abnormal Pap smear of cervix   . Anxiety   . Back pain   . Cervical dysplasia   . Depression   . Elevated WBCs    NEGATIVE HEM W/U  . GERD (gastroesophageal reflux disease)   . Hypothyroid     Past Surgical History:  Procedure Laterality Date  . COLPOSCOPY    . GYNECOLOGIC CRYOSURGERY      There were no vitals filed for this visit.      Subjective Assessment - 09/03/16 1903    Subjective Patient reports she is getting better and now has only stiffness in right side mid thoracic spine. She continues with headaches and has taken ibuprofen today to prevent more intense headache.    Limitations Sitting;Standing;Walking;House hold activities;Other (comment)   Patient Stated Goals decrease pain as much as possible, to manage self    Currently in Pain? Yes   Pain Score 4    Pain Location Back   Pain Orientation Upper;Mid   Pain Descriptors / Indicators Aching;Tightness;Constant   Pain Type Chronic pain   Pain Onset More than a month ago   Pain Frequency Constant      Objective: Palpation: + tenderness with mild to moderate spasms palpable along right mid thoracic spine, moderate spasms/increased tone suboccipitally with tenderness right>left side;   Treatment:  Manual  therapy: STM upper trapezius with stretches 3 x 20 reps with patient supine lying, suboccipital release: goal: reduce spasms , pain Joint mobilization thoracic spine PA mobilizations grade 2-3 3 sets 5-10 bouts each level in conjunction with STM Prone lying STM left gluteal muscles followed by hip extension with improved ease of movement.  Modalities:  Ultrasound/combination with high volt and [place] 1.3w/cm2 50% pulsed x 40min. Right thoracic, both upper trapezius and left lower lumbarspine paraspinal muscleswith patient seated in massage chair  Patient response to treatment: Patient with improved soft tissue elasticity with decreased spasms to mild right thoracic spine, improved joint mobility with less tenderness from previous sessions and able to perform hip extension with less effort following STM. She continued with mild headache on top of head with bending forward at end of session.         PT Education - 09/03/16 2000    Education provided Yes   Education Details HEP: continue with exercises as instructed, add left gluteal self STM and hip extension   Person(s) Educated Patient   Methods Explanation;Demonstration;Verbal cues   Comprehension Verbalized understanding;Returned demonstration;Verbal cues required             PT Long Term Goals - 07/30/16 0915      PT LONG TERM GOAL #1   Title Patient will report max pain level of 5/10 with aggravating factors of sitting,  standing by 09/24/2016 demonstrating improved function   Baseline pain level max 9/10   Status New     PT LONG TERM GOAL #2   Title MODI improved to <20% self perceived impairment demonstrating improved function with dialy tasks, sitting, work, baking, sleeping by 09/24/2016   Baseline MODI 30%   Status New     PT LONG TERM GOAL #3   Title Independent with home program for self managment of pain, flexibility/strengthening to prevent re occurrence of symptoms by discharge 09/24/2016   Baseline limited to no  knowledge of appropriate pain control strategies, exercise progression, requires max. guidance, instruction/cuing   Status New               Plan - 09/03/16 2003    Clinical Impression Statement Patient is progressing well with treatment with decreasiing pain and stiffness in upper back. She continues with intermittent headaches and spasms in upper back and cervical spine and wil benefit from continued physical therapy interveniton to achieve goals.    Rehab Potential Good   PT Frequency 2x / week   PT Duration 8 weeks   PT Treatment/Interventions Iontophoresis 4mg /ml Dexamethasone;Electrical Stimulation;Patient/family education;Cryotherapy;Neuromuscular re-education;Manual techniques;Therapeutic exercise;Dry needling;Moist Heat;Ultrasound   PT Next Visit Plan pain control, therapeutic exercise   PT Home Exercise Plan posture exercises, use of cervical and lumbar supports for sleep/sitting, break every hour at work to correct posture      Patient will benefit from skilled therapeutic intervention in order to improve the following deficits and impairments:  Postural dysfunction, Decreased strength, Improper body mechanics, Pain, Impaired perceived functional ability, Increased muscle spasms, Decreased range of motion  Visit Diagnosis: Other muscle spasm  Pain in thoracic spine  Muscle weakness (generalized)     Problem List Patient Active Problem List   Diagnosis Date Noted  . Headache 07/06/2016  . Allergic rhinitis 07/06/2016  . Reactive airway disease 03/30/2016  . Bilateral elbow joint pain 12/23/2015  . Pre-diabetes 12/13/2015  . Vitamin D deficiency 12/13/2015  . Elevated LDL cholesterol level 12/13/2015  . Back pain   . Hypothyroid 01/11/2012  . Elevated WBCs 01/11/2012  . Obesity 01/11/2012    [name] PT 09/04/2016, 9:22 PM  [facility] [address] [place], [address] Phone:  [phone]   Fax:  [phone]  Name: MARIALUISA KOZAN MRN: [id number] Date of Birth: [birth year]

## 2016-09-07 ENCOUNTER — Ambulatory Visit: Payer: BLUE CROSS/BLUE SHIELD | Admitting: Physical Therapy

## 2016-09-07 ENCOUNTER — Encounter: Payer: Self-pay | Admitting: Physical Therapy

## 2016-09-07 DIAGNOSIS — M6281 Muscle weakness (generalized): Secondary | ICD-10-CM | POA: Diagnosis not present

## 2016-09-07 DIAGNOSIS — M546 Pain in thoracic spine: Secondary | ICD-10-CM | POA: Diagnosis not present

## 2016-09-07 DIAGNOSIS — M62838 Other muscle spasm: Secondary | ICD-10-CM

## 2016-09-08 NOTE — Therapy (Signed)
[place] [facility] [address], [place], [address] Phone: [phone]   Fax:  [phone]  Physical Therapy Treatment  Patient Details  Name: Tina Moreno MRN: [id number] Date of Birth: 06-Sep-1966 Referring Provider: Leone Haven MD  Encounter Date: 09/07/2016      PT End of Session - 09/07/16 2003    Visit Number 11   Number of Visits 16   Date for PT Re-Evaluation 09/24/16   PT Start Time 1903   PT Stop Time 1955   PT Time Calculation (min) 52 min   Activity Tolerance Patient tolerated treatment well   Behavior During Therapy [facility] for tasks assessed/performed      Past Medical History:  Diagnosis Date  . Abnormal Pap smear of cervix   . Anxiety   . Back pain   . Cervical dysplasia   . Depression   . Elevated WBCs    NEGATIVE HEM W/U  . GERD (gastroesophageal reflux disease)   . Hypothyroid     Past Surgical History:  Procedure Laterality Date  . COLPOSCOPY    . GYNECOLOGIC CRYOSURGERY      There were no vitals filed for this visit.      Subjective Assessment - 09/07/16 1905    Subjective Patient reports she is getting better and now has only stiffness in right side mid thoracic spine. No headache reported today   Limitations Sitting;Standing;Walking;House hold activities;Other (comment)   Patient Stated Goals decrease pain as much as possible, to manage self    Currently in Pain? Yes   Pain Score 3    Pain Location Back   Pain Orientation Right;Upper;Mid   Pain Descriptors / Indicators Aching;Tightness   Pain Type Chronic pain   Pain Onset More than a month ago   Pain Frequency Intermittent      Objective: Palpation: + tenderness with mild to moderate spasms palpable along right mid thoracic spine, moderate spasms/increased tone suboccipitally with tenderness right>left side;   Treatment:  Manual therapy: STM upper trapezius with stretches 3 x 20 reps with patient  supine lying, suboccipital release: goal: reduce spasms , pain Joint mobilization thoracic spine PA mobilizations grade 2-3 3 sets 5-10 bouts each level with patient in massage chair  Modalities:  Ultrasound/combination with high volt and [place] 1.3w/cm2 50% pulsed x 16min. Right thoracic, both upper trapezius and 3MHz static 0.8w/cm2 pulsed 20% x 3 min. Over TrP right thoracic spine with patient seated in massage chair  Patient response to treatment: Patient with improved soft tissue elasticity with decreased spasms and decreased tenderness by at least 50% following treatment. No headache reported throughout session.          PT Education - 09/07/16 2000    Education provided Yes   Education Details HEP: re assessed   Person(s) Educated Patient   Methods Explanation   Comprehension Verbalized understanding             PT Long Term Goals - 07/30/16 0915      PT LONG TERM GOAL #1   Title Patient will report max pain level of 5/10 with aggravating factors of sitting, standing by 09/24/2016 demonstrating improved function   Baseline pain level max 9/10   Status New     PT LONG TERM GOAL #2   Title MODI improved to <20% self perceived impairment demonstrating improved function with dialy tasks, sitting, work, baking, sleeping by 09/24/2016   Baseline MODI 30%   Status New  PT LONG TERM GOAL #3   Title Independent with home program for self managment of pain, flexibility/strengthening to prevent re occurrence of symptoms by discharge 09/24/2016   Baseline limited to no knowledge of appropriate pain control strategies, exercise progression, requires max. guidance, instruction/cuing   Status New               Plan - 09/07/16 2007    Clinical Impression Statement Patient continues to improve with decresaed spasms and improved pain control with less pain during daily tasks including sitting and standing/baking. She will benefit from continued physical therapy intervention to  further decrease pain and be able to transition to independent home program.    Rehab Potential Good   PT Frequency 2x / week   PT Duration 8 weeks   PT Treatment/Interventions Iontophoresis 4mg /ml Dexamethasone;Electrical Stimulation;Patient/family education;Cryotherapy;Neuromuscular re-education;Manual techniques;Therapeutic exercise;Dry needling;Moist Heat;Ultrasound   PT Next Visit Plan pain control, therapeutic exercise   PT Home Exercise Plan posture exercises, use of cervical and lumbar supports for sleep/sitting, break every hour at work to correct posture      Patient will benefit from skilled therapeutic intervention in order to improve the following deficits and impairments:  Postural dysfunction, Decreased strength, Improper body mechanics, Pain, Impaired perceived functional ability, Increased muscle spasms, Decreased range of motion  Visit Diagnosis: Other muscle spasm  Pain in thoracic spine     Problem List Patient Active Problem List   Diagnosis Date Noted  . Headache 07/06/2016  . Allergic rhinitis 07/06/2016  . Reactive airway disease 03/30/2016  . Bilateral elbow joint pain 12/23/2015  . Pre-diabetes 12/13/2015  . Vitamin D deficiency 12/13/2015  . Elevated LDL cholesterol level 12/13/2015  . Back pain   . Hypothyroid 01/11/2012  . Elevated WBCs 01/11/2012  . Obesity 01/11/2012    [name] PT 09/08/2016, 8:09 PM  [place] [facility] [address], [place], [address] Phone: [phone]   Fax:  [phone]  Name: Tina Moreno MRN: [id number] Date of Birth: [birth year]

## 2016-09-10 ENCOUNTER — Encounter: Payer: Self-pay | Admitting: Physical Therapy

## 2016-09-10 ENCOUNTER — Ambulatory Visit: Payer: BLUE CROSS/BLUE SHIELD | Admitting: Physical Therapy

## 2016-09-10 DIAGNOSIS — M6281 Muscle weakness (generalized): Secondary | ICD-10-CM

## 2016-09-10 DIAGNOSIS — M62838 Other muscle spasm: Secondary | ICD-10-CM

## 2016-09-10 DIAGNOSIS — M546 Pain in thoracic spine: Secondary | ICD-10-CM

## 2016-09-10 NOTE — Therapy (Signed)
[facility] [address], [place], [address] Phone: [phone]   Fax:  [phone]  Physical Therapy Treatment  Patient Details  Name: Tina Moreno MRN: [id number] Date of Birth: Oct 15, 1965 Referring Provider: Leone Haven MD  Encounter Date: 09/10/2016      PT End of Session - 09/10/16 2000    Visit Number 12   Number of Visits 16   Date for PT Re-Evaluation 09/24/16   PT Start Time 1815   PT Stop Time 1903   PT Time Calculation (min) 48 min   Activity Tolerance Patient tolerated treatment well   Behavior During Therapy [facility] for tasks assessed/performed      Past Medical History:  Diagnosis Date  . Abnormal Pap smear of cervix   . Anxiety   . Back pain   . Cervical dysplasia   . Depression   . Elevated WBCs    NEGATIVE HEM W/U  . GERD (gastroesophageal reflux disease)   . Hypothyroid     Past Surgical History:  Procedure Laterality Date  . COLPOSCOPY    . GYNECOLOGIC CRYOSURGERY      There were no vitals filed for this visit.      Subjective Assessment - 09/10/16 1817    Subjective Patient reports she has exercised at home 3x/week. she is having increased pain in lower back lumbar spine    Limitations Sitting;Standing;Walking;House hold activities;Other (comment)   Patient Stated Goals decrease pain as much as possible, to manage self    Currently in Pain? Yes   Pain Score 6    Pain Location Back   Pain Orientation Right;Upper;Lower;Mid   Pain Descriptors / Indicators Aching;Tightness   Pain Type Chronic pain   Pain Onset Yesterday   Pain Frequency Intermittent      Objective: Palpation: + tenderness with mild to moderatespasms palpable along right mid thoracic spine, left lumbar spine paraspinal muscles, mild spasms right suboccipital muscles   Treatment:  Manual therapy: STM upper trapezius with stretches 3 x 20 reps with patient supine lying, suboccipital  release: goal: reduce spasms , pain Therapeutic exercises: Patient performed with demonstration, verbal and tactile cuing of therapist: Prone lying: Hip extension x 10 through partial ROM to perform with glutes and back stabilization engaged Supine/hook lying: Stabilization with single leg marching x 10 each Bridging with hold at end range x 3 seconds x 10 reps Sitting slouch/correct to engage back muscles x 10  Modalities:  Ultrasound/combination with high volt and [place] 1.3w/cm2 50% pulsed x 15 min.  Thoracic and lumbar spine paraspinal muscles with concentration over spasms, with patient seated in massage chair  Patient response to treatment: patient reported decreased back symptoms following extension exercises in sitting and prone lying, decreased spasms and tenderness following modalities to mild <3/10 at end of session. Improved muscle control with tactile, VC and repetition with extension of hip, sitting slouch correct exercises         PT Education - 09/10/16 2004    Education provided Yes   Education Details HEP: slouch correct in sitting, stabilization in supine: bridging, marching x 10, prone hip extension partial ROM to engage back extensors/stabilization   Person(s) Educated Patient   Methods Explanation;Demonstration;Tactile cues;Verbal cues;Handout   Comprehension Verbalized understanding;Returned demonstration;Verbal cues required;Tactile cues required             PT Long Term Goals - 07/30/16 0915      PT LONG TERM GOAL #1  Title Patient will report max pain level of 5/10 with aggravating factors of sitting, standing by 09/24/2016 demonstrating improved function   Baseline pain level max 9/10   Status New     PT LONG TERM GOAL #2   Title MODI improved to <20% self perceived impairment demonstrating improved function with dialy tasks, sitting, work, baking, sleeping by 09/24/2016   Baseline MODI 30%   Status New     PT LONG TERM GOAL #3   Title Independent  with home program for self managment of pain, flexibility/strengthening to prevent re occurrence of symptoms by discharge 09/24/2016   Baseline limited to no knowledge of appropriate pain control strategies, exercise progression, requires max. guidance, instruction/cuing   Status New               Plan - 09/10/16 2000    Clinical Impression Statement Patient with decreased pain and spasms to mild. good understanding of home exercises and improved muscle control with cuing of therapist and repetition.    Rehab Potential Good   PT Frequency 2x / week   PT Duration 8 weeks   PT Treatment/Interventions Iontophoresis 4mg /ml Dexamethasone;Electrical Stimulation;Patient/family education;Cryotherapy;Neuromuscular re-education;Manual techniques;Therapeutic exercise;Dry needling;Moist Heat;Ultrasound   PT Next Visit Plan pain control, therapeutic exercise   PT Home Exercise Plan posture exercises, use of cervical and lumbar supports for sleep/sitting, break every hour at work to correct posture      Patient will benefit from skilled therapeutic intervention in order to improve the following deficits and impairments:  Postural dysfunction, Decreased strength, Improper body mechanics, Pain, Impaired perceived functional ability, Increased muscle spasms, Decreased range of motion  Visit Diagnosis: Other muscle spasm  Pain in thoracic spine  Muscle weakness (generalized)     Problem List Patient Active Problem List   Diagnosis Date Noted  . Headache 07/06/2016  . Allergic rhinitis 07/06/2016  . Reactive airway disease 03/30/2016  . Bilateral elbow joint pain 12/23/2015  . Pre-diabetes 12/13/2015  . Vitamin D deficiency 12/13/2015  . Elevated LDL cholesterol level 12/13/2015  . Back pain   . Hypothyroid 01/11/2012  . Elevated WBCs 01/11/2012  . Obesity 01/11/2012    [name] PT 09/11/2016, 12:18 PM  [facility] [address] [place], [address] Phone: [phone]   Fax:  [phone]  Name: Tina Moreno MRN: [id number] Date of Birth: May 26, 1966

## 2016-09-16 ENCOUNTER — Encounter: Payer: Self-pay | Admitting: Physical Therapy

## 2016-09-16 ENCOUNTER — Ambulatory Visit: Payer: BLUE CROSS/BLUE SHIELD | Admitting: Physical Therapy

## 2016-09-16 DIAGNOSIS — M546 Pain in thoracic spine: Secondary | ICD-10-CM

## 2016-09-16 DIAGNOSIS — M6281 Muscle weakness (generalized): Secondary | ICD-10-CM

## 2016-09-16 DIAGNOSIS — M62838 Other muscle spasm: Secondary | ICD-10-CM | POA: Diagnosis not present

## 2016-09-17 NOTE — Therapy (Signed)
[facility] [address] [place], [address] Phone: [phone]   Fax:  [phone]  Physical Therapy Treatment/Discharge Summary  Patient Details  Name: Tina Moreno MRN: [id number] Date of Birth: [birth year] Referring Provider: Leone Haven MD  Encounter Date: 09/16/2016   Patient began physical therapy 07/30/2016 and attended 13 sessions through 09/16/2016 with goals achieved or partially met and she is independent with home program and self management and should continue to improve.       PT End of Session - 09/16/16 1855    Visit Number 13   Number of Visits 16   Date for PT Re-Evaluation 09/24/16   PT Start Time 8022   PT Stop Time 1855   PT Time Calculation (min) 35 min   Activity Tolerance Patient tolerated treatment well   Behavior During Therapy [facility] for tasks assessed/performed      Past Medical History:  Diagnosis Date  . Abnormal Pap smear of cervix   . Anxiety   . Back pain   . Cervical dysplasia   . Depression   . Elevated WBCs    NEGATIVE HEM W/U  . GERD (gastroesophageal reflux disease)   . Hypothyroid     Past Surgical History:  Procedure Laterality Date  . COLPOSCOPY    . GYNECOLOGIC CRYOSURGERY      There were no vitals filed for this visit.      Subjective Assessment - 09/16/16 1823    Subjective Patient reports she is much improved with function since beginning therapy and now maximal level of pain with prolonged standing is less than 5/10. She agrees to discharge to continue with independent home exercises.    Limitations Sitting;Standing;Walking;House hold activities;Other (comment)   Patient Stated Goals decrease pain as much as possible, to manage self    Currently in Pain? Yes   Pain Score 1    Pain Location Back   Pain Orientation Right   Pain Descriptors / Indicators Tightness   Pain Type Chronic pain   Pain Onset More than a month ago   Pain  Frequency Intermittent      Objective: Outcome measure:  NDI 24% (0 = no self perceived disability) Palpation: mild tenderness with mild spasms palpable along right suboccipital muscles, mid thoracic spine Posture: much improved with more erect position and decreasing excessive thoracic kyphosis   Treatment:  Manual therapy: STM  suboccipital release: goal: reduce spasms , pain Therapeutic exercises: Patient performed with demonstration, verbal and tactile cuing of therapist: Prone lying: Hip extension x 10 through partial ROM to perform with glutes and back stabilization engaged Supine/hook lying: Stabilization with single leg marching x 10 each, dead bug with arms x 10 reps and instructed in opposite arm/leg  Bridging with hold at end range x 3 seconds x 10 reps Sitting slouch/correct to engage back muscles x 10  Patient response to treatment: Patient demonstrated good technique with stabilization and postural exercises with minimal to no cuing and no increased symptoms of pain in back. Improved soft tissue elasticity suboccipitally with decreased tenderness following manual techniques.          PT Education - 09/16/16 1833    Education provided Yes   Education Details HEP: re assessed home exercises for stabilization, supine, side lying and prone   Person(s) Educated Patient   Methods Explanation;Demonstration;Verbal cues   Comprehension Verbalized understanding;Returned demonstration;Verbal cues required  PT Long Term Goals - 09/16/16 1910      PT LONG TERM GOAL #1   Title Patient will report max pain level of 5/10 with aggravating factors of sitting, standing by 09/24/2016 demonstrating improved function   Baseline pain level max 9/10; max pain level currently 4-5/10   Status Achieved     PT LONG TERM GOAL #2   Title MODI improved to <20% self perceived impairment demonstrating improved function with dialy tasks, sitting, work, baking, sleeping by  09/24/2016   Baseline MODI 30%: currently 24% (09/16/16)   Status Partially Met     PT LONG TERM GOAL #3   Title Independent with home program for self managment of pain, flexibility/strengthening to prevent re occurrence of symptoms by discharge 09/24/2016   Baseline limited to no knowledge of appropriate pain control strategies, exercise progression, requires max. guidance, instruction/cuing; 09/16/16: independent   Status Achieved               Plan - 09/16/16 1904    Clinical Impression Statement Patient has achieved all goals and demonstrates good understanding of home exercises and self management and should continue to improve with self management.    Rehab Potential Good   PT Frequency 2x / week   PT Duration 8 weeks   PT Treatment/Interventions Iontophoresis 33m/ml Dexamethasone;Electrical Stimulation;Patient/family education;Cryotherapy;Neuromuscular re-education;Manual techniques;Therapeutic exercise;Dry needling;Moist Heat;Ultrasound   PT Next Visit Plan discharge   PT Home Exercise Plan posture exercises, use of cervical and lumbar supports for sleep/sitting, break every hour at work to correct posture; stabilizaiton exercises   Consulted and Agree with Plan of Care Patient      Patient will benefit from skilled therapeutic intervention in order to improve the following deficits and impairments:  Postural dysfunction, Decreased strength, Improper body mechanics, Pain, Impaired perceived functional ability, Increased muscle spasms, Decreased range of motion  Visit Diagnosis: Other muscle spasm  Pain in thoracic spine  Muscle weakness (generalized)     Problem List Patient Active Problem List   Diagnosis Date Noted  . Headache 07/06/2016  . Allergic rhinitis 07/06/2016  . Reactive airway disease 03/30/2016  . Bilateral elbow joint pain 12/23/2015  . Pre-diabetes 12/13/2015  . Vitamin D deficiency 12/13/2015  . Elevated LDL cholesterol level 12/13/2015  .  Back pain   . Hypothyroid 01/11/2012  . Elevated WBCs 01/11/2012  . Obesity 01/11/2012    BJomarie LongsPT 09/17/2016, 8:38 PM  [facility] [address] [place] [address]Phone: [phone]  Fax:  [phone] Name: HAUJANAE MCCULLUMMRN: [id number] of Birth: 105/15/1967

## 2016-09-23 ENCOUNTER — Encounter: Payer: BLUE CROSS/BLUE SHIELD | Admitting: Physical Therapy

## 2016-09-24 ENCOUNTER — Other Ambulatory Visit: Payer: Self-pay | Admitting: Family Medicine

## 2016-10-06 ENCOUNTER — Encounter: Payer: Self-pay | Admitting: Family Medicine

## 2016-10-06 ENCOUNTER — Ambulatory Visit (INDEPENDENT_AMBULATORY_CARE_PROVIDER_SITE_OTHER): Payer: BLUE CROSS/BLUE SHIELD | Admitting: Family Medicine

## 2016-10-06 DIAGNOSIS — J069 Acute upper respiratory infection, unspecified: Secondary | ICD-10-CM | POA: Diagnosis not present

## 2016-10-06 DIAGNOSIS — F418 Other specified anxiety disorders: Secondary | ICD-10-CM

## 2016-10-06 DIAGNOSIS — J4520 Mild intermittent asthma, uncomplicated: Secondary | ICD-10-CM | POA: Diagnosis not present

## 2016-10-06 DIAGNOSIS — F32A Depression, unspecified: Secondary | ICD-10-CM | POA: Insufficient documentation

## 2016-10-06 DIAGNOSIS — F329 Major depressive disorder, single episode, unspecified: Secondary | ICD-10-CM

## 2016-10-06 DIAGNOSIS — M549 Dorsalgia, unspecified: Secondary | ICD-10-CM

## 2016-10-06 DIAGNOSIS — F419 Anxiety disorder, unspecified: Secondary | ICD-10-CM

## 2016-10-06 DIAGNOSIS — E039 Hypothyroidism, unspecified: Secondary | ICD-10-CM

## 2016-10-06 DIAGNOSIS — G8929 Other chronic pain: Secondary | ICD-10-CM

## 2016-10-06 NOTE — Assessment & Plan Note (Signed)
Followed by psychiatry. She'll continue to follow with them and continue her current medication regimen. Given return precautions.

## 2016-10-06 NOTE — Patient Instructions (Signed)
Nice to see you. Please continue the exercises for your back. Please see if the gynecologist can check TSH when you have your physical with them in March. Please monitor your upper respiratory symptoms and if they worsen please let us know. You can continue the nasal spray and add Claritin or Zyrtec to help with her symptoms. She can also continue the albuterol as needed.

## 2016-10-06 NOTE — Assessment & Plan Note (Signed)
Has only required her albuterol twice since we last saw each other. It did help significantly. I discussed if she needs this more frequently or if she develops more frequent symptoms of reactive airway disease we could consider adding an inhaled corticosteroid to see if this would be beneficial. She can continue the albuterol if needed. She'll let us know if she has more frequent symptoms.

## 2016-10-06 NOTE — Assessment & Plan Note (Signed)
Suspect upper respiratory symptoms likely related to virus or allergies. Discussed Flonase and adding Claritin or Zyrtec. Continue albuterol if needed. If symptoms worsen she'll let us know.

## 2016-10-06 NOTE — Assessment & Plan Note (Signed)
Hair loss has stabilized. No abnormalities noted on evaluation of scalp. She'll continue Synthroid. Most recent TSH in the normal range. She'll see if her gynecologist will recheck this in March at her physical.

## 2016-10-06 NOTE — Assessment & Plan Note (Signed)
Much improved. Benign exam. Continue home exercises.

## 2016-10-06 NOTE — Progress Notes (Signed)
Tommi Rumps, MD Phone: [phone]  Shyvonne Pauley Scaffidi is a 51 y.o. female who presents today for follow-up.  Back pain: Patient notes this is significantly better after doing physical therapy. Is mild at times. Typically occurs in her middle back and does not radiate. No numbness or weakness.  Hypothyroidism: Currently taking Synthroid. No skin changes. No weight changes. Some heat intolerance and no cold intolerance. Did have some hair loss previously though this has stabilized as her TSH has become under good control.  Upper respiratory infection: Over the last several days she has had postnasal drip and some congestion. No fevers. Breathing well at this time. Had needed to use her albuterol twice a couple weeks ago due to some reactive airway issues and this significantly helped her breathing. She's noted no wheezing. Has not needed the albuterol since then. Was first time she had used the albuterol in a long time.  Anxiety/depression: Notes this is stable. Followed by psychiatry. Currently on Lamictal, Wellbutrin, Lexapro, Klonopin, and Xanax. She does not take Klonopin and Xanax at the same time. They tried to drop the Lexapro though she needed to start back on it as her symptoms worsened while off of it. No SI or HI.  PMH: nonsmoker.   ROS see history of present illness  Objective  Physical Exam Vitals:   10/06/16 0847  BP: 130/68  Pulse: 69  Temp: 98.5 F (36.9 C)    BP Readings from Last 3 Encounters:  10/06/16 130/68  07/06/16 108/66  03/30/16 118/68   Wt Readings from Last 3 Encounters:  10/06/16 193 lb 3.2 oz (87.6 kg)  07/06/16 191 lb 3.2 oz (86.7 kg)  03/30/16 189 lb 9.6 oz (86 kg)    Physical Exam  Constitutional: No distress.  HENT:  Head: Normocephalic and atraumatic.  Mouth/Throat: Oropharynx is clear and moist. No oropharyngeal exudate.  Normal TMs  Eyes: Conjunctivae are normal. Pupils are equal, round, and reactive to light.  Cardiovascular:  Normal rate, regular rhythm and normal heart sounds.   Pulmonary/Chest: Effort normal and breath sounds normal.  Musculoskeletal: She exhibits no edema.  No midline spine tenderness, no midline spine step-off, no muscular back tenderness  Neurological: She is alert. Gait normal.  5 out of 5 strength bilateral quads, hamstrings, plantar flexion, and dorsiflexion, sensation to light touch intact in bilateral lower extremities  Skin: Skin is warm and dry. She is not diaphoretic.     Assessment/Plan: Please see individual problem list.  Reactive airway disease Has only required her albuterol twice since we last saw each other. It did help significantly. I discussed if she needs this more frequently or if she develops more frequent symptoms of reactive airway disease we could consider adding an inhaled corticosteroid to see if this would be beneficial. She can continue the albuterol if needed. She'll let us know if she has more frequent symptoms.  Acute upper respiratory infection Suspect upper respiratory symptoms likely related to virus or allergies. Discussed Flonase and adding Claritin or Zyrtec. Continue albuterol if needed. If symptoms worsen she'll let us know.  Hypothyroid Hair loss has stabilized. No abnormalities noted on evaluation of scalp. She'll continue Synthroid. Most recent TSH in the normal range. She'll see if her gynecologist will recheck this in March at her physical.  Back pain Much improved. Benign exam. Continue home exercises.  Anxiety and depression Followed by psychiatry. She'll continue to follow with them and continue her current medication regimen. Given return precautions.   Tommi Rumps, MD  [facility]

## 2016-10-06 NOTE — Progress Notes (Signed)
Pre visit review using our clinic review tool, if applicable. No additional management support is needed unless otherwise documented below in the visit note. 

## 2016-12-02 DIAGNOSIS — F331 Major depressive disorder, recurrent, moderate: Secondary | ICD-10-CM | POA: Diagnosis not present

## 2016-12-11 ENCOUNTER — Encounter: Payer: Self-pay | Admitting: Obstetrics and Gynecology

## 2016-12-11 ENCOUNTER — Ambulatory Visit (INDEPENDENT_AMBULATORY_CARE_PROVIDER_SITE_OTHER): Payer: BLUE CROSS/BLUE SHIELD | Admitting: Obstetrics and Gynecology

## 2016-12-11 VITALS — BP 106/59 | HR 87 | Ht 60.0 in | Wt 191.6 lb

## 2016-12-11 DIAGNOSIS — Z01419 Encounter for gynecological examination (general) (routine) without abnormal findings: Secondary | ICD-10-CM | POA: Diagnosis not present

## 2016-12-11 MED ORDER — TERCONAZOLE 0.4 % VA CREA: TOPICAL_CREAM | VAGINAL | 2 refills | Status: DC

## 2016-12-11 NOTE — Progress Notes (Signed)
Subjective:   Tina Moreno is a 51 y.o. G47P1001 Caucasian female here for a routine well-woman exam.  Patient's last menstrual period was 11/27/2016.    Current complaints: rash comes and goes in groin PCP: [place]       does desire labs  Social History: Sexual: heterosexual Marital Status: single Living situation: alone Occupation: data entry Tobacco/alcohol: no tobacco use Illicit drugs: no history of illicit drug use  The following portions of the patient's history were reviewed and updated as appropriate: allergies, current medications, past family history, past medical history, past social history, past surgical history and problem list.  Past Medical History Past Medical History:  Diagnosis Date  . Abnormal Pap smear of cervix   . Anxiety   . Back pain   . Cervical dysplasia   . Depression   . Elevated WBCs    NEGATIVE HEM W/U  . GERD (gastroesophageal reflux disease)   . Hypothyroid     Past Surgical History Past Surgical History:  Procedure Laterality Date  . COLPOSCOPY    . GYNECOLOGIC CRYOSURGERY      Gynecologic History G2P1001  Patient's last menstrual period was 11/27/2016. Contraception: abstinence Last Pap: 2017. Results were: normal Last mammogram: 2014. Results were: normal   Obstetric History OB History  Gravida Para Term Preterm AB Living  2 2 1     1   SAB TAB Ectopic Multiple Live Births               # Outcome Date GA Lbr Len/2nd Weight Sex Delivery Anes PTL Lv  2 Para 1988    M Vag-Spont     1 Term               Current Medications Current Outpatient Prescriptions on File Prior to Visit  Medication Sig Dispense Refill  . albuterol (PROVENTIL HFA;VENTOLIN HFA) 108 (90 Base) MCG/ACT inhaler Inhale 2 puffs into the lungs every 6 (six) hours as needed for wheezing or shortness of breath. 1 Inhaler 0  . ALPRAZolam (XANAX) 0.5 MG tablet Take 0.5 mg by mouth as needed.      Marland Kitchen buPROPion (WELLBUTRIN XL) 300 MG 24 hr tablet Take 300 mg  by mouth every morning.  0  . carisoprodol (SOMA) 350 MG tablet Take 1 tablet (350 mg total) by mouth 3 (three) times daily as needed. 30 tablet 0  . clonazePAM (KLONOPIN) 0.5 MG tablet Take 1 tablet (0.5 mg total) by mouth 2 (two) times daily as needed. 30 tablet 0  . escitalopram (LEXAPRO) 5 MG tablet Take 5 mg by mouth daily.    Marland Kitchen ibuprofen (ADVIL,MOTRIN) 200 MG tablet Take 200 mg by mouth every 6 (six) hours as needed.      . lamoTRIgine (LAMICTAL) 200 MG tablet   0  . norelgestromin-ethinyl estradiol Marilu Favre) 150-35 MCG/24HR transdermal patch apply 1 patch and replace weekly for 3 weeks 1 Package 12  . SYNTHROID 125 MCG tablet take 1 tablet by mouth once daily before BREAKFAST 90 tablet 1  . Vitamin D, Ergocalciferol, (DRISDOL) 50000 units CAPS capsule Take 1 capsule (50,000 Units total) by mouth every 7 (seven) days. (Patient not taking: Reported on 12/11/2016) 30 capsule 1   No current facility-administered medications on file prior to visit.     Review of Systems Patient denies any headaches, blurred vision, shortness of breath, chest pain, abdominal pain, problems with bowel movements, urination, or intercourse.  Objective:  BP (!) 106/59   Pulse 87   Ht 5' (  1.524 m)   Wt 191 lb 9.6 oz (86.9 kg)   LMP 11/27/2016   BMI 37.42 kg/m  Physical Exam  General:  Well developed, well nourished, no acute distress. She is alert and oriented x3. Skin:  Warm and dry Neck:  Midline trachea, no thyromegaly or nodules Cardiovascular: Regular rate and rhythm, no murmur heard Lungs:  Effort normal, all lung fields clear to auscultation bilaterally Breasts:  No dominant palpable mass, retraction, or nipple discharge Abdomen:  Soft, non tender, no hepatosplenomegaly or masses Pelvic:  External genitalia is normal in appearance.  The vagina is normal in appearance. The cervix is bulbous, no CMT.  Thin prep pap is not done. Uterus is felt to be normal size, shape, and contour.  No adnexal masses or  tenderness noted. Tinea in right groin noted (not new) Rectal: Good sphincter tone, no polyps, or hemorrhoids felt.   Extremities:  No swelling or varicosities noted Psych:  She has a normal mood and affect  Assessment:   Healthy well-woman exam Obesity Vitamin d deficiency Tinea of groin   Plan:  Labs obtained, need to send to [facility] in [place]. terazol sent in for prn use.  F/U 1 year for AE, or sooner if needed Mammogram ordered  Melody Rockney Ghee, CNM

## 2016-12-12 LAB — COMPREHENSIVE METABOLIC PANEL
A/G RATIO: 1.6 (ref 1.2–2.2)
ALK PHOS: 100 IU/L (ref 39–117)
ALT: 8 IU/L (ref 0–32)
AST: 14 IU/L (ref 0–40)
Albumin: 4.3 g/dL (ref 3.5–5.5)
BUN/Creatinine Ratio: 14 (ref 9–23)
BUN: 10 mg/dL (ref 6–24)
CHLORIDE: 103 mmol/L (ref 96–106)
CO2: 22 mmol/L (ref 18–29)
Calcium: 9.2 mg/dL (ref 8.7–10.2)
Creatinine, Ser: 0.73 mg/dL (ref 0.57–1.00)
GFR calc Af Amer: 113 mL/min/{1.73_m2} (ref >59)
GFR calc non Af Amer: 98 mL/min/{1.73_m2} (ref >59)
GLUCOSE: 96 mg/dL (ref 65–99)
Globulin, Total: 2.7 g/dL (ref 1.5–4.5)
POTASSIUM: 4.8 mmol/L (ref 3.5–5.2)
Sodium: 143 mmol/L (ref 134–144)
TOTAL PROTEIN: 7 g/dL (ref 6.0–8.5)

## 2016-12-12 LAB — THYROID PANEL WITH TSH
Free Thyroxine Index: 2 (ref 1.2–4.9)
T3 UPTAKE RATIO: 17 % — AB (ref 24–39)
T4, Total: 11.6 ug/dL (ref 4.5–12.0)
TSH: 3.61 u[IU]/mL (ref 0.450–4.500)

## 2016-12-12 LAB — B12 AND FOLATE PANEL
Folate: 17 ng/mL (ref >3.0)
VITAMIN B 12: 606 pg/mL (ref 232–1245)

## 2016-12-12 LAB — LIPID PANEL
CHOL/HDL RATIO: 3.8 ratio (ref 0.0–4.4)
Cholesterol, Total: 207 mg/dL — ABNORMAL HIGH (ref 100–199)
HDL: 54 mg/dL (ref >39)
LDL CALC: 124 mg/dL — AB (ref 0–99)
TRIGLYCERIDES: 144 mg/dL (ref 0–149)
VLDL CHOLESTEROL CAL: 29 mg/dL (ref 5–40)

## 2016-12-12 LAB — HEMOGLOBIN A1C
ESTIMATED AVERAGE GLUCOSE: 111 mg/dL
Hgb A1c MFr Bld: 5.5 % (ref 4.8–5.6)

## 2016-12-12 LAB — VITAMIN D 25 HYDROXY (VIT D DEFICIENCY, FRACTURES): VIT D 25 HYDROXY: 28.2 ng/mL — AB (ref 30.0–100.0)

## 2016-12-12 LAB — MAGNESIUM: Magnesium: 2.3 mg/dL (ref 1.6–2.3)

## 2016-12-15 ENCOUNTER — Encounter: Payer: Self-pay | Admitting: Family Medicine

## 2016-12-16 MED ORDER — SYNTHROID 125 MCG PO TABS: ORAL_TABLET | ORAL | 1 refills | Status: DC

## 2016-12-16 NOTE — Telephone Encounter (Signed)
I have pended the medication, please advise

## 2016-12-24 ENCOUNTER — Encounter: Payer: Self-pay | Admitting: Obstetrics and Gynecology

## 2016-12-28 ENCOUNTER — Other Ambulatory Visit: Payer: Self-pay | Admitting: *Deleted

## 2016-12-28 MED ORDER — VITAMIN D (ERGOCALCIFEROL) 1.25 MG (50000 UNIT) PO CAPS: 50000.0000 [IU] | ORAL_CAPSULE | ORAL | 6 refills | Status: DC

## 2016-12-28 MED ORDER — NORELGESTROMIN-ETH ESTRADIOL 150-35 MCG/24HR TD PTWK: MEDICATED_PATCH | TRANSDERMAL | 12 refills | Status: DC

## 2017-01-15 ENCOUNTER — Encounter: Payer: Self-pay | Admitting: Obstetrics and Gynecology

## 2017-02-03 ENCOUNTER — Encounter: Payer: Self-pay | Admitting: Gynecology

## 2017-04-05 ENCOUNTER — Ambulatory Visit: Payer: BLUE CROSS/BLUE SHIELD | Admitting: Family Medicine

## 2017-04-12 ENCOUNTER — Encounter: Payer: Self-pay | Admitting: Family Medicine

## 2017-04-12 ENCOUNTER — Ambulatory Visit (INDEPENDENT_AMBULATORY_CARE_PROVIDER_SITE_OTHER): Payer: BLUE CROSS/BLUE SHIELD | Admitting: Family Medicine

## 2017-04-12 VITALS — BP 120/68 | HR 77 | Temp 98.8°F | Wt 201.4 lb

## 2017-04-12 DIAGNOSIS — E039 Hypothyroidism, unspecified: Secondary | ICD-10-CM | POA: Diagnosis not present

## 2017-04-12 DIAGNOSIS — R7303 Prediabetes: Secondary | ICD-10-CM | POA: Diagnosis not present

## 2017-04-12 DIAGNOSIS — M25532 Pain in left wrist: Secondary | ICD-10-CM | POA: Insufficient documentation

## 2017-04-12 DIAGNOSIS — E7800 Pure hypercholesterolemia, unspecified: Secondary | ICD-10-CM | POA: Diagnosis not present

## 2017-04-12 LAB — HEMOGLOBIN A1C: HEMOGLOBIN A1C: 5.9 % (ref 4.6–6.5)

## 2017-04-12 LAB — COMPREHENSIVE METABOLIC PANEL
ALK PHOS: 82 U/L (ref 39–117)
ALT: 9 U/L (ref 0–35)
AST: 15 U/L (ref 0–37)
Albumin: 3.8 g/dL (ref 3.5–5.2)
BUN: 13 mg/dL (ref 6–23)
CO2: 27 mEq/L (ref 19–32)
Calcium: 9 mg/dL (ref 8.4–10.5)
Chloride: 103 mEq/L (ref 96–112)
Creatinine, Ser: 0.81 mg/dL (ref 0.40–1.20)
GFR: 79.99 mL/min (ref >60.00)
GLUCOSE: 106 mg/dL — AB (ref 70–99)
POTASSIUM: 4.3 meq/L (ref 3.5–5.1)
Sodium: 136 mEq/L (ref 135–145)
TOTAL PROTEIN: 6.9 g/dL (ref 6.0–8.3)
Total Bilirubin: 0.3 mg/dL (ref 0.2–1.2)

## 2017-04-12 LAB — LIPID PANEL
Cholesterol: 178 mg/dL (ref 0–200)
HDL: 50.8 mg/dL (ref >39.00)
LDL CALC: 99 mg/dL (ref 0–99)
NONHDL: 126.85
Total CHOL/HDL Ratio: 3
Triglycerides: 138 mg/dL (ref 0.0–149.0)
VLDL: 27.6 mg/dL (ref 0.0–40.0)

## 2017-04-12 LAB — TSH: TSH: 10 u[IU]/mL — ABNORMAL HIGH (ref 0.35–4.50)

## 2017-04-12 NOTE — Progress Notes (Signed)
  Tommi Rumps, MD Phone: [phone]  Tina Moreno is a 51 y.o. female who presents today for follow-up.  Hypothyroidism: Taking Synthroid. Weight is up slightly. Does note chronic heat intolerance that is worse than usual. No skin changes.  Elevated LDL: No chest pain or shortness of breath. Not doing a lot of exercise. Doing exercises for her back. Does note her diet is okay though she does eat whatever she feels like.  Prediabetes: Some polydipsia though this is chronic and she believes is related to her medications. No polyuria.  Patient reports some time recently her dog ran into her and hurt her left wrist. She did not fall on her left wrist. Notes she has had pain like this previously related to tendinitis. She has not tried anything for this.  PMH: nonsmoker.   ROS see history of present illness  Objective  Physical Exam Vitals:   04/12/17 0809  BP: 120/68  Pulse: 77  Temp: 98.8 F (37.1 C)    BP Readings from Last 3 Encounters:  04/12/17 120/68  12/11/16 (!) 106/59  10/06/16 130/68   Wt Readings from Last 3 Encounters:  04/12/17 201 lb 6.4 oz (91.4 kg)  12/11/16 191 lb 9.6 oz (86.9 kg)  10/06/16 193 lb 3.2 oz (87.6 kg)    Physical Exam  Constitutional: No distress.  Cardiovascular: Normal rate, regular rhythm and normal heart sounds.   Pulmonary/Chest: Effort normal and breath sounds normal.  Musculoskeletal:  Bilateral wrist with no swelling, warmth, erythema, or tenderness, no anatomic snuffbox tenderness, no forearm tenderness bilaterally, 5/5 strength bilateral grip, hands are warm and well-perfused  Neurological: She is alert. Gait normal.  Skin: Skin is warm and dry. She is not diaphoretic.     Assessment/Plan: Please see individual problem list.  Hypothyroid Due for TSH check. Does have some slight worsening of heat intolerance which could be related to Synthroid or one of her other medications. We'll see what her TSH  is.  Pre-diabetes Chronic polydipsia possibly related to psychiatric medications. Check A1c today.  Elevated LDL cholesterol level Lipid panel today. She'll work on diet and exercise.  Left wrist pain Possible soft tissue injury versus tendinitis related to prior injury from dog running into her. Benign exam today. Encouraged ibuprofen or Tylenol. Can ice as well. If not improving she'll follow-up.   Orders Placed This Encounter  Procedures  . Lipid Profile  . HgB A1c  . Comp Met (CMET)  . TSH   Tommi Rumps, MD [place]

## 2017-04-12 NOTE — Assessment & Plan Note (Signed)
Due for TSH check. Does have some slight worsening of heat intolerance which could be related to Synthroid or one of her other medications. We'll see what her TSH is.

## 2017-04-12 NOTE — Assessment & Plan Note (Signed)
Lipid panel today. She'll work on diet and exercise.

## 2017-04-12 NOTE — Assessment & Plan Note (Signed)
Chronic polydipsia possibly related to psychiatric medications. Check A1c today.

## 2017-04-12 NOTE — Assessment & Plan Note (Signed)
Possible soft tissue injury versus tendinitis related to prior injury from dog running into her. Benign exam today. Encouraged ibuprofen or Tylenol. Can ice as well. If not improving she'll follow-up.

## 2017-04-12 NOTE — Patient Instructions (Signed)
Nice to see you. We will check some lab work today and contact you with the results. You can use ibuprofen or Tylenol for your wrist. You should take the ibuprofen with food. Please also ice it. If it does not start to improve please let us know.

## 2017-04-13 ENCOUNTER — Telehealth: Payer: Self-pay | Admitting: Family Medicine

## 2017-04-13 ENCOUNTER — Other Ambulatory Visit: Payer: Self-pay | Admitting: Family Medicine

## 2017-04-13 DIAGNOSIS — E039 Hypothyroidism, unspecified: Secondary | ICD-10-CM

## 2017-04-13 MED ORDER — LEVOTHYROXINE SODIUM 137 MCG PO TABS: 137.0000 ug | ORAL_TABLET | Freq: Every day | ORAL | 3 refills | Status: DC

## 2017-04-13 NOTE — Telephone Encounter (Signed)
Pt called back returning your call. Please advise, thank you!  Call pt @ [phone]

## 2017-04-13 NOTE — Telephone Encounter (Signed)
See result note message. Dr. Caryl Bis pt.

## 2017-04-14 ENCOUNTER — Other Ambulatory Visit: Payer: Self-pay

## 2017-04-14 MED ORDER — LEVOTHYROXINE SODIUM 137 MCG PO TABS: 137.0000 ug | ORAL_TABLET | Freq: Every day | ORAL | 3 refills | Status: DC

## 2017-04-22 DIAGNOSIS — F331 Major depressive disorder, recurrent, moderate: Secondary | ICD-10-CM | POA: Diagnosis not present

## 2017-05-14 ENCOUNTER — Other Ambulatory Visit (INDEPENDENT_AMBULATORY_CARE_PROVIDER_SITE_OTHER): Payer: BLUE CROSS/BLUE SHIELD

## 2017-05-14 DIAGNOSIS — E039 Hypothyroidism, unspecified: Secondary | ICD-10-CM | POA: Diagnosis not present

## 2017-05-14 LAB — TSH: TSH: 2.55 u[IU]/mL (ref 0.35–4.50)

## 2017-06-13 ENCOUNTER — Other Ambulatory Visit: Payer: Self-pay | Admitting: Family Medicine

## 2017-06-18 ENCOUNTER — Telehealth: Payer: Self-pay | Admitting: Obstetrics and Gynecology

## 2017-06-18 ENCOUNTER — Other Ambulatory Visit: Payer: Self-pay | Admitting: *Deleted

## 2017-06-18 MED ORDER — VITAMIN D (ERGOCALCIFEROL) 1.25 MG (50000 UNIT) PO CAPS: 50000.0000 [IU] | ORAL_CAPSULE | ORAL | 6 refills | Status: DC

## 2017-06-18 MED ORDER — NORELGESTROMIN-ETH ESTRADIOL 150-35 MCG/24HR TD PTWK: MEDICATED_PATCH | TRANSDERMAL | 4 refills | Status: DC

## 2017-06-18 NOTE — Telephone Encounter (Signed)
[facility] LVM advising that pt had contacted them to request the following medications in 90 day supply with 3 refills:  1. Vitamin D, Ergocalciferol, (DRISDOL) 50000 units CAPS capsule  2. norelgestromin-ethinyl estradiol Marilu Favre) 150-35 MCG/24HR transdermal patch   Please advise. Thanks TNP

## 2017-06-23 DIAGNOSIS — F331 Major depressive disorder, recurrent, moderate: Secondary | ICD-10-CM | POA: Diagnosis not present

## 2017-09-23 DIAGNOSIS — F331 Major depressive disorder, recurrent, moderate: Secondary | ICD-10-CM | POA: Diagnosis not present

## 2017-10-13 ENCOUNTER — Encounter: Payer: Self-pay | Admitting: Family Medicine

## 2017-10-13 ENCOUNTER — Other Ambulatory Visit: Payer: Self-pay

## 2017-10-13 ENCOUNTER — Ambulatory Visit: Payer: BLUE CROSS/BLUE SHIELD | Admitting: Family Medicine

## 2017-10-13 VITALS — BP 102/70 | HR 79 | Temp 98.1°F | Wt 217.0 lb

## 2017-10-13 DIAGNOSIS — R7303 Prediabetes: Secondary | ICD-10-CM | POA: Diagnosis not present

## 2017-10-13 DIAGNOSIS — J309 Allergic rhinitis, unspecified: Secondary | ICD-10-CM | POA: Diagnosis not present

## 2017-10-13 DIAGNOSIS — J4530 Mild persistent asthma, uncomplicated: Secondary | ICD-10-CM | POA: Diagnosis not present

## 2017-10-13 DIAGNOSIS — Z1231 Encounter for screening mammogram for malignant neoplasm of breast: Secondary | ICD-10-CM

## 2017-10-13 DIAGNOSIS — E039 Hypothyroidism, unspecified: Secondary | ICD-10-CM

## 2017-10-13 DIAGNOSIS — E559 Vitamin D deficiency, unspecified: Secondary | ICD-10-CM | POA: Diagnosis not present

## 2017-10-13 DIAGNOSIS — Z1239 Encounter for other screening for malignant neoplasm of breast: Secondary | ICD-10-CM

## 2017-10-13 LAB — VITAMIN D 25 HYDROXY (VIT D DEFICIENCY, FRACTURES): VITD: 47.15 ng/mL (ref 30.00–100.00)

## 2017-10-13 LAB — TSH: TSH: 4.11 u[IU]/mL (ref 0.35–4.50)

## 2017-10-13 LAB — HEMOGLOBIN A1C: Hgb A1c MFr Bld: 6.1 % (ref 4.6–6.5)

## 2017-10-13 MED ORDER — BECLOMETHASONE DIPROP HFA 40 MCG/ACT IN AERB: 1.0000 | INHALATION_SPRAY | Freq: Two times a day (BID) | RESPIRATORY_TRACT | 2 refills | Status: DC

## 2017-10-13 NOTE — Progress Notes (Signed)
Scheduled and mailed.

## 2017-10-13 NOTE — Patient Instructions (Signed)
Nice to see you. We will have you start using Nasonex daily to help with your symptoms. Please start the Qvar inhaler as well. If your asthma symptoms do not improve or your cough does not improve please let us know.

## 2017-10-13 NOTE — Assessment & Plan Note (Signed)
Recheck A1c.  Encourage dietary changes and exercise.

## 2017-10-13 NOTE — Progress Notes (Signed)
Tommi Rumps, MD Phone: [phone]  Tina Moreno is a 52 y.o. female who presents today for follow-up.  Hypothyroidism: Taking Synthroid.  We increased the dose last time related to an elevated TSH.  Repeat was normal.  No skin changes.  No heat or cold intolerance changes.  Weight has increased.  She feels this is related to being on the generic Synthroid.  Prediabetes: Some chronic polydipsia that may be medication related.  No polyuria.  No exercise.  Not much fried or fatty foods.  Cough: Notes some postnasal drip chronically.  Was sick around Thanksgiving and has not quite recovered since then.  Occasional wheezing.  Some airway tightness at times.  Cough is worse in the morning.  She uses albuterol with good benefit.  No fevers.  No significant upper respiratory congestion.  Social History   Tobacco Use  Smoking Status Never Smoker  Smokeless Tobacco Never Used     ROS see history of present illness  Objective  Physical Exam Vitals:   10/13/17 0813  BP: 102/70  Pulse: 79  Temp: 98.1 F (36.7 C)  SpO2: 95%    BP Readings from Last 3 Encounters:  10/13/17 102/70  04/12/17 120/68  12/11/16 (!) 106/59   Wt Readings from Last 3 Encounters:  10/13/17 217 lb (98.4 kg)  04/12/17 201 lb 6.4 oz (91.4 kg)  12/11/16 191 lb 9.6 oz (86.9 kg)    Physical Exam  Constitutional: No distress.  HENT:  Head: Normocephalic and atraumatic.  Some postnasal drip noted, no exudate  Eyes: Conjunctivae are normal. Pupils are equal, round, and reactive to light.  Cardiovascular: Normal rate, regular rhythm and normal heart sounds.  Pulmonary/Chest: Effort normal and breath sounds normal.  Musculoskeletal: She exhibits no edema.  Neurological: She is alert. Gait normal.  Skin: Skin is warm and dry. She is not diaphoretic.     Assessment/Plan: Please see individual problem list.  Reactive airway disease She has continued to have issues with this.  History of asthma.   Suspect some of her symptoms are related to postnasal drip and allergic rhinitis as well.  We will start her on Qvar to see if this is beneficial.  We will see her back in 2 months for recheck.  If she worsens she will be reevaluated.  Allergic rhinitis I believe some allergy symptoms are contributing to her cough.  She will use Nasonex daily.  If not improving she will let us know.  Follow-up in 2 months.  Hypothyroid She is due for a TSH check.  She will continue Synthroid.  Pre-diabetes Recheck A1c.  Encourage dietary changes and exercise.   Orders Placed This Encounter  Procedures  . MM SCREENING BREAST TOMO BILATERAL    Standing Status:   Future    Standing Expiration Date:   12/12/2018    Order Specific Question:   Reason for Exam (SYMPTOM  OR DIAGNOSIS REQUIRED)    Answer:   breast cancer screening    Order Specific Question:   Is the patient pregnant?    Answer:   No    Order Specific Question:   Preferred imaging location?    Answer:   [facility]  . Vitamin D (25 hydroxy)  . TSH  . HgB A1c    Meds ordered this encounter  Medications  . beclomethasone (QVAR REDIHALER) 40 MCG/ACT inhaler    Sig: Inhale 1 puff into the lungs 2 (two) times daily.    Dispense:  1 Inhaler  Refill:  Hiram, MD [place]

## 2017-10-13 NOTE — Assessment & Plan Note (Signed)
She has continued to have issues with this.  History of asthma.  Suspect some of her symptoms are related to postnasal drip and allergic rhinitis as well.  We will start her on Qvar to see if this is beneficial.  We will see her back in 2 months for recheck.  If she worsens she will be reevaluated.

## 2017-10-13 NOTE — Assessment & Plan Note (Signed)
I believe some allergy symptoms are contributing to her cough.  She will use Nasonex daily.  If not improving she will let us know.  Follow-up in 2 months.

## 2017-10-13 NOTE — Assessment & Plan Note (Signed)
She is due for a TSH check.  She will continue Synthroid.

## 2017-11-02 ENCOUNTER — Ambulatory Visit
Admission: RE | Admit: 2017-11-02 | Discharge: 2017-11-02 | Disposition: A | Discharge status: Home / Self Care | Payer: BLUE CROSS/BLUE SHIELD | Source: Ambulatory Visit | Attending: Family Medicine | Admitting: Family Medicine

## 2017-11-02 DIAGNOSIS — Z1231 Encounter for screening mammogram for malignant neoplasm of breast: Secondary | ICD-10-CM | POA: Insufficient documentation

## 2017-11-02 DIAGNOSIS — Z1239 Encounter for other screening for malignant neoplasm of breast: Secondary | ICD-10-CM

## 2017-11-03 ENCOUNTER — Other Ambulatory Visit: Payer: Self-pay | Admitting: Family Medicine

## 2017-11-03 DIAGNOSIS — R928 Other abnormal and inconclusive findings on diagnostic imaging of breast: Secondary | ICD-10-CM

## 2017-11-03 DIAGNOSIS — N6310 Unspecified lump in the right breast, unspecified quadrant: Secondary | ICD-10-CM

## 2017-11-08 ENCOUNTER — Telehealth: Payer: Self-pay | Admitting: Family Medicine

## 2017-11-08 NOTE — Telephone Encounter (Signed)
Attempted to contact patient to see if she had heard about her mammogram results.  There is no answer.  I left a message asking her to call back to the office.  Please attempt to contact her tomorrow and see if she has heard anything regarding results.  If she has not please let me speak to her.  Thanks.

## 2017-11-09 NOTE — Telephone Encounter (Signed)
Left message to return call 

## 2017-11-10 NOTE — Telephone Encounter (Signed)
Noted. Please try again later this week. Thanks.

## 2017-11-10 NOTE — Telephone Encounter (Signed)
Patient has been scheduled but I have not been able to reach her by phone to ask if she received results

## 2017-11-11 NOTE — Telephone Encounter (Signed)
Left message to return call 

## 2017-11-16 NOTE — Telephone Encounter (Signed)
Patient states she was given her mammogram results and is aware of her imaging appointment on friday

## 2017-11-19 ENCOUNTER — Ambulatory Visit
Admission: RE | Admit: 2017-11-19 | Discharge: 2017-11-19 | Disposition: A | Discharge status: Home / Self Care | Payer: BLUE CROSS/BLUE SHIELD | Source: Ambulatory Visit | Attending: Family Medicine | Admitting: Family Medicine

## 2017-11-19 DIAGNOSIS — N6011 Diffuse cystic mastopathy of right breast: Secondary | ICD-10-CM | POA: Diagnosis not present

## 2017-11-19 DIAGNOSIS — N6310 Unspecified lump in the right breast, unspecified quadrant: Secondary | ICD-10-CM

## 2017-11-19 DIAGNOSIS — R928 Other abnormal and inconclusive findings on diagnostic imaging of breast: Secondary | ICD-10-CM | POA: Insufficient documentation

## 2017-11-19 DIAGNOSIS — N6001 Solitary cyst of right breast: Secondary | ICD-10-CM | POA: Insufficient documentation

## 2017-11-19 DIAGNOSIS — R922 Inconclusive mammogram: Secondary | ICD-10-CM | POA: Diagnosis not present

## 2017-12-16 ENCOUNTER — Encounter: Payer: BLUE CROSS/BLUE SHIELD | Admitting: Obstetrics and Gynecology

## 2017-12-17 ENCOUNTER — Ambulatory Visit: Payer: BLUE CROSS/BLUE SHIELD | Admitting: Family Medicine

## 2018-01-19 DIAGNOSIS — F330 Major depressive disorder, recurrent, mild: Secondary | ICD-10-CM | POA: Diagnosis not present

## 2018-01-19 DIAGNOSIS — F411 Generalized anxiety disorder: Secondary | ICD-10-CM | POA: Diagnosis not present

## 2018-02-21 ENCOUNTER — Ambulatory Visit: Payer: BLUE CROSS/BLUE SHIELD | Admitting: Family Medicine

## 2018-02-21 ENCOUNTER — Encounter: Payer: Self-pay | Admitting: Family Medicine

## 2018-02-21 VITALS — BP 110/72 | HR 79 | Temp 98.5°F | Ht 60.0 in | Wt 228.4 lb

## 2018-02-21 DIAGNOSIS — E039 Hypothyroidism, unspecified: Secondary | ICD-10-CM

## 2018-02-21 DIAGNOSIS — F329 Major depressive disorder, single episode, unspecified: Secondary | ICD-10-CM

## 2018-02-21 DIAGNOSIS — J309 Allergic rhinitis, unspecified: Secondary | ICD-10-CM

## 2018-02-21 DIAGNOSIS — W19XXXA Unspecified fall, initial encounter: Secondary | ICD-10-CM

## 2018-02-21 DIAGNOSIS — E7800 Pure hypercholesterolemia, unspecified: Secondary | ICD-10-CM

## 2018-02-21 DIAGNOSIS — F419 Anxiety disorder, unspecified: Secondary | ICD-10-CM | POA: Diagnosis not present

## 2018-02-21 DIAGNOSIS — R7303 Prediabetes: Secondary | ICD-10-CM

## 2018-02-21 DIAGNOSIS — J4530 Mild persistent asthma, uncomplicated: Secondary | ICD-10-CM

## 2018-02-21 DIAGNOSIS — F32A Depression, unspecified: Secondary | ICD-10-CM

## 2018-02-21 LAB — COMPREHENSIVE METABOLIC PANEL
ALK PHOS: 75 U/L (ref 39–117)
ALT: 10 U/L (ref 0–35)
AST: 15 U/L (ref 0–37)
Albumin: 3.8 g/dL (ref 3.5–5.2)
BUN: 15 mg/dL (ref 6–23)
CALCIUM: 9.6 mg/dL (ref 8.4–10.5)
CO2: 27 mEq/L (ref 19–32)
CREATININE: 0.71 mg/dL (ref 0.40–1.20)
Chloride: 102 mEq/L (ref 96–112)
GFR: 92.8 mL/min (ref >60.00)
Glucose, Bld: 113 mg/dL — ABNORMAL HIGH (ref 70–99)
POTASSIUM: 4 meq/L (ref 3.5–5.1)
SODIUM: 137 meq/L (ref 135–145)
TOTAL PROTEIN: 7.3 g/dL (ref 6.0–8.3)
Total Bilirubin: 0.3 mg/dL (ref 0.2–1.2)

## 2018-02-21 LAB — LDL CHOLESTEROL, DIRECT: LDL DIRECT: 104 mg/dL

## 2018-02-21 LAB — HEMOGLOBIN A1C: Hgb A1c MFr Bld: 6.3 % (ref 4.6–6.5)

## 2018-02-21 LAB — TSH: TSH: 9.46 u[IU]/mL — ABNORMAL HIGH (ref 0.35–4.50)

## 2018-02-21 NOTE — Assessment & Plan Note (Signed)
Followed by psychiatry.  Seems to have improved with Abilify.  She will continue to see them.

## 2018-02-21 NOTE — Progress Notes (Signed)
Tommi Rumps, MD Phone: [phone]  Tina Moreno is a 52 y.o. female who presents today for f/u.  CC: prediabetes, RAD, anxiety/depression  Reactive airway disease: Patient has done well with the Qvar.  She tried to wean herself off of it after allergy season though continued to have some symptoms.  She started back on it and her symptoms are less.  She uses albuterol less than 1-2 times a week.  Occasional wheezing.  She had some shortness of breath when she had allergy symptoms that would respond to albuterol.  Notes that occurred on one occasion.  No nighttime symptoms.  No exacerbations.  No chest pain.  Prediabetes: She is not exercising.  She is trying to eat better.  No sweet tea or soda.  Not much junk.  No polyuria or polydipsia.  She reports her postnasal drip improved.  She notes yesterday she had a fall when her legs got caught up underneath her when she was getting something out of the garage.  She fell onto her right knee and right shoulder.  She notes a little bit of bruised type sensation in those areas.  No loss of consciousness or head injury.  She has not taken anything for this.  Anxiety/depression: She does note her depression symptoms are better since going on Abilify through her psychiatrist.  She notes a little bit of anxiety since adding that.  Does not take Xanax very often.  Is on Lexapro.  No SI.  Social History   Tobacco Use  Smoking Status Never Smoker  Smokeless Tobacco Never Used     ROS see history of present illness  Objective  Physical Exam Vitals:   02/21/18 0904  BP: 110/72  Pulse: 79  Temp: 98.5 F (36.9 C)  SpO2: 97%    BP Readings from Last 3 Encounters:  02/21/18 110/72  10/13/17 102/70  04/12/17 120/68   Wt Readings from Last 3 Encounters:  02/21/18 228 lb 6.4 oz (103.6 kg)  10/13/17 217 lb (98.4 kg)  04/12/17 201 lb 6.4 oz (91.4 kg)    Physical Exam  Constitutional: No distress.  Cardiovascular: Normal rate,  regular rhythm and normal heart sounds.  Pulmonary/Chest: Effort normal and breath sounds normal.  Musculoskeletal: She exhibits no edema.  Slight tenderness over the musculature of the anterior right shoulder, full range of motion of the right shoulder with no pain on range of motion, no bony tenderness, right knee with no swelling, warmth, erythema, or tenderness  Neurological: She is alert.  Skin: Skin is warm and dry. She is not diaphoretic.     Assessment/Plan: Please see individual problem list.  Reactive airway disease Did improve with Qvar though she tried to come off of this on her own.  Had recurrence of symptoms.  She is now back on it for about a week.  I discussed having her continue this for several more weeks and if her symptoms do not improve further letting us know so we can increase the dose.  Allergic rhinitis Improved.  Hypothyroid Check TSH.  Pre-diabetes Check A1c.  Encouraged diet and exercise.  Elevated LDL cholesterol level Check LDL cholesterol.  Anxiety and depression Followed by psychiatry.  Seems to have improved with Abilify.  She will continue to see them.  Fall Seems to have been a mechanical fall.  Suspect soft tissue injuries based on exam.  She will monitor.  She can ice.  Ibuprofen or Tylenol as needed.   Orders Placed This Encounter  Procedures  .  TSH  . Comp Met (CMET)  . HgB A1c  . Direct LDL    No orders of the defined types were placed in this encounter.    Tommi Rumps, MD [place]

## 2018-02-21 NOTE — Assessment & Plan Note (Signed)
Seems to have been a mechanical fall.  Suspect soft tissue injuries based on exam.  She will monitor.  She can ice.  Ibuprofen or Tylenol as needed.

## 2018-02-21 NOTE — Assessment & Plan Note (Signed)
Check LDL cholesterol.

## 2018-02-21 NOTE — Assessment & Plan Note (Signed)
Check A1c.  Encouraged diet and exercise.

## 2018-02-21 NOTE — Assessment & Plan Note (Signed)
Improved

## 2018-02-21 NOTE — Assessment & Plan Note (Signed)
Check TSH 

## 2018-02-21 NOTE — Patient Instructions (Signed)
Nice to see you. We will check lab work today and contact with the results. Please continue the Qvar.  If your symptoms do not continue to improve please let us know and we can increase the dose. Please monitor your injuries from her fall.   you can ice these areas or use Tylenol or ibuprofen.

## 2018-02-21 NOTE — Assessment & Plan Note (Signed)
Did improve with Qvar though she tried to come off of this on her own.  Had recurrence of symptoms.  She is now back on it for about a week.  I discussed having her continue this for several more weeks and if her symptoms do not improve further letting us know so we can increase the dose.

## 2018-02-24 ENCOUNTER — Other Ambulatory Visit: Payer: Self-pay | Admitting: Family Medicine

## 2018-02-24 DIAGNOSIS — E039 Hypothyroidism, unspecified: Secondary | ICD-10-CM

## 2018-02-24 MED ORDER — LEVOTHYROXINE SODIUM 150 MCG PO TABS: 150.0000 ug | ORAL_TABLET | Freq: Every day | ORAL | 1 refills | Status: DC

## 2018-03-01 DIAGNOSIS — F411 Generalized anxiety disorder: Secondary | ICD-10-CM | POA: Diagnosis not present

## 2018-03-01 DIAGNOSIS — F330 Major depressive disorder, recurrent, mild: Secondary | ICD-10-CM | POA: Diagnosis not present

## 2018-04-07 ENCOUNTER — Other Ambulatory Visit (INDEPENDENT_AMBULATORY_CARE_PROVIDER_SITE_OTHER): Payer: BLUE CROSS/BLUE SHIELD

## 2018-04-07 DIAGNOSIS — E039 Hypothyroidism, unspecified: Secondary | ICD-10-CM

## 2018-04-07 LAB — TSH: TSH: 1.54 u[IU]/mL (ref 0.35–4.50)

## 2018-05-11 DIAGNOSIS — F330 Major depressive disorder, recurrent, mild: Secondary | ICD-10-CM | POA: Diagnosis not present

## 2018-05-11 DIAGNOSIS — F411 Generalized anxiety disorder: Secondary | ICD-10-CM | POA: Diagnosis not present

## 2018-08-02 DIAGNOSIS — F411 Generalized anxiety disorder: Secondary | ICD-10-CM | POA: Diagnosis not present

## 2018-08-02 DIAGNOSIS — F330 Major depressive disorder, recurrent, mild: Secondary | ICD-10-CM | POA: Diagnosis not present

## 2018-08-22 ENCOUNTER — Other Ambulatory Visit: Payer: Self-pay | Admitting: Family Medicine

## 2018-08-26 ENCOUNTER — Ambulatory Visit: Payer: BLUE CROSS/BLUE SHIELD | Admitting: Family Medicine

## 2018-08-26 ENCOUNTER — Encounter: Payer: Self-pay | Admitting: Family Medicine

## 2018-08-26 VITALS — BP 120/78 | HR 89 | Temp 98.7°F | Resp 16 | Ht 60.0 in | Wt 249.0 lb

## 2018-08-26 DIAGNOSIS — R7303 Prediabetes: Secondary | ICD-10-CM | POA: Diagnosis not present

## 2018-08-26 DIAGNOSIS — R002 Palpitations: Secondary | ICD-10-CM | POA: Diagnosis not present

## 2018-08-26 DIAGNOSIS — Z6841 Body Mass Index (BMI) 40.0 and over, adult: Secondary | ICD-10-CM

## 2018-08-26 DIAGNOSIS — R0609 Other forms of dyspnea: Secondary | ICD-10-CM | POA: Insufficient documentation

## 2018-08-26 DIAGNOSIS — E039 Hypothyroidism, unspecified: Secondary | ICD-10-CM

## 2018-08-26 LAB — COMPREHENSIVE METABOLIC PANEL
ALBUMIN: 3.5 g/dL (ref 3.5–5.2)
ALT: 11 U/L (ref 0–35)
AST: 16 U/L (ref 0–37)
Alkaline Phosphatase: 63 U/L (ref 39–117)
BILIRUBIN TOTAL: 0.2 mg/dL (ref 0.2–1.2)
BUN: 11 mg/dL (ref 6–23)
CO2: 24 meq/L (ref 19–32)
CREATININE: 0.58 mg/dL (ref 0.40–1.20)
Calcium: 8.4 mg/dL (ref 8.4–10.5)
Chloride: 103 mEq/L (ref 96–112)
GFR: 116.95 mL/min (ref >60.00)
Glucose, Bld: 114 mg/dL — ABNORMAL HIGH (ref 70–99)
Potassium: 4 mEq/L (ref 3.5–5.1)
SODIUM: 137 meq/L (ref 135–145)
Total Protein: 6.8 g/dL (ref 6.0–8.3)

## 2018-08-26 LAB — CBC
HCT: 38.8 % (ref 36.0–46.0)
HEMOGLOBIN: 12.5 g/dL (ref 12.0–15.0)
MCHC: 32.1 g/dL (ref 30.0–36.0)
MCV: 85.4 fl (ref 78.0–100.0)
PLATELETS: 376 10*3/uL (ref 150.0–400.0)
RBC: 4.54 Mil/uL (ref 3.87–5.11)
RDW: 15.9 % — ABNORMAL HIGH (ref 11.5–15.5)
WBC: 11.6 10*3/uL — AB (ref 4.0–10.5)

## 2018-08-26 LAB — BRAIN NATRIURETIC PEPTIDE: Pro B Natriuretic peptide (BNP): 19 pg/mL (ref 0.0–100.0)

## 2018-08-26 LAB — HEMOGLOBIN A1C: HEMOGLOBIN A1C: 6.8 % — AB (ref 4.6–6.5)

## 2018-08-26 LAB — TSH: TSH: 11.2 u[IU]/mL — ABNORMAL HIGH (ref 0.35–4.50)

## 2018-08-26 NOTE — Patient Instructions (Signed)
Nice to see you. If you develop persistent shortness of breath or palpitations please be evaluated immediately. We will check lab work and contact you with the results.

## 2018-08-26 NOTE — Progress Notes (Signed)
  Tommi Rumps, MD Phone: [phone]  Tina Moreno is a 52 y.o. female who presents today for f/u.  CC: hypothyroidism, prediabetes, palpitations, fluid retention  Hypothyroidism: She is taking Synthroid.  She does note occasional heat intolerance.  Notes her skin has been dry and her nails have been brittle.  She does note weight gain as well.  Prediabetes: No polyuria or polydipsia.  Does note her weight shot up since the last time she was weighed.  She does not exercise.  She eats fairly good when she is at home though she does eat lunch out at fast food restaurants.  Palpitations: Patient notes for last few weeks she has had a weird fluttering sensation later in the day in her chest.  Typically occurs when she is sitting.  She is not overly anxious when it starts to happen.  She notes no other significant symptoms with it.  She does report some fluid retention going back to the summer.  This occurs in her hands and feet.  She does note some chronic dyspnea on exertion that is worse than usual.  She notes no orthopnea or PND.  Social History   Tobacco Use  Smoking Status Never Smoker  Smokeless Tobacco Never Used     ROS see history of present illness  Objective  Physical Exam Vitals:   08/26/18 0859  BP: 120/78  Pulse: 89  Resp: 16  Temp: 98.7 F (37.1 C)  SpO2: 97%    BP Readings from Last 3 Encounters:  08/26/18 120/78  02/21/18 110/72  10/13/17 102/70   Wt Readings from Last 3 Encounters:  08/26/18 249 lb (112.9 kg)  02/21/18 228 lb 6.4 oz (103.6 kg)  10/13/17 217 lb (98.4 kg)    Physical Exam  Constitutional: No distress.  Neck: No thyromegaly present.  Cardiovascular: Normal rate, regular rhythm and normal heart sounds.  Pulmonary/Chest: Effort normal and breath sounds normal.  Musculoskeletal: She exhibits edema (Trace pitting edema to the midshin).  Neurological: She is alert.  Skin: Skin is warm and dry. She is not diaphoretic.   EKG: Sinus  rhythm, rate 73, no apparent ischemic changes, no arrhythmia noted  Assessment/Plan: Please see individual problem list.  Hypothyroid Check TSH.  Continue Synthroid.  Pre-diabetes Discussed altering her diet with choosing healthier options at lunch.  Discussed adding walking for exercise on her days off.  Check A1c.  Palpitations Patient with several weeks of palpitations.  Undetermined cause.  She does have issues with anxiety though anxiety does not seem to precede the palpitations.  EKG is reassuring.  We will check lab work for cause.  DOE (dyspnea on exertion) Chronic issue seems to be worsened recently.  Could be related to deconditioning with weight gain.  With edema could be CHF related.  We will check an EKG and BNP.  Will check for anemia.  Consider cardiology evaluation once labs return.  Given return precautions.  Obesity Weight has trended up significantly since her last visit.  Discussed dietary changes and exercise.  Also advised that she discuss her psychiatric medications with her psychiatrist as they can contribute to weight gain.    Orders Placed This Encounter  Procedures  . CBC  . TSH  . Comp Met (CMET)  . HgB A1c  . B Nat Peptide  . EKG 12-Lead    No orders of the defined types were placed in this encounter.    Tommi Rumps, MD [place]

## 2018-08-26 NOTE — Assessment & Plan Note (Signed)
Chronic issue seems to be worsened recently.  Could be related to deconditioning with weight gain.  With edema could be CHF related.  We will check an EKG and BNP.  Will check for anemia.  Consider cardiology evaluation once labs return.  Given return precautions.

## 2018-08-26 NOTE — Assessment & Plan Note (Signed)
Weight has trended up significantly since her last visit.  Discussed dietary changes and exercise.  Also advised that she discuss her psychiatric medications with her psychiatrist as they can contribute to weight gain.

## 2018-08-26 NOTE — Assessment & Plan Note (Addendum)
Patient with several weeks of palpitations.  Undetermined cause.  She does have issues with anxiety though anxiety does not seem to precede the palpitations.  EKG is reassuring.  We will check lab work for cause.

## 2018-08-26 NOTE — Assessment & Plan Note (Signed)
Discussed altering her diet with choosing healthier options at lunch.  Discussed adding walking for exercise on her days off.  Check A1c.

## 2018-08-26 NOTE — Assessment & Plan Note (Signed)
Check TSH.  Continue Synthroid. 

## 2018-09-06 ENCOUNTER — Other Ambulatory Visit: Payer: Self-pay | Admitting: Family Medicine

## 2018-09-06 DIAGNOSIS — E039 Hypothyroidism, unspecified: Secondary | ICD-10-CM

## 2018-09-06 DIAGNOSIS — R002 Palpitations: Secondary | ICD-10-CM

## 2018-10-06 ENCOUNTER — Other Ambulatory Visit (INDEPENDENT_AMBULATORY_CARE_PROVIDER_SITE_OTHER): Payer: BLUE CROSS/BLUE SHIELD

## 2018-10-06 ENCOUNTER — Other Ambulatory Visit: Payer: Self-pay | Admitting: Family Medicine

## 2018-10-06 DIAGNOSIS — E039 Hypothyroidism, unspecified: Secondary | ICD-10-CM

## 2018-10-06 LAB — TSH: TSH: 8.69 u[IU]/mL — ABNORMAL HIGH (ref 0.35–4.50)

## 2018-10-06 MED ORDER — LEVOTHYROXINE SODIUM 175 MCG PO TABS: 175.0000 ug | ORAL_TABLET | Freq: Every day | ORAL | 1 refills | Status: DC

## 2018-11-10 ENCOUNTER — Encounter: Payer: Self-pay | Admitting: Internal Medicine

## 2018-11-10 ENCOUNTER — Ambulatory Visit: Payer: BLUE CROSS/BLUE SHIELD | Admitting: Internal Medicine

## 2018-11-10 VITALS — BP 142/88 | HR 96 | Temp 97.8°F | Ht 60.0 in | Wt 239.0 lb

## 2018-11-10 DIAGNOSIS — R05 Cough: Secondary | ICD-10-CM | POA: Diagnosis not present

## 2018-11-10 DIAGNOSIS — R059 Cough, unspecified: Secondary | ICD-10-CM

## 2018-11-10 DIAGNOSIS — B370 Candidal stomatitis: Secondary | ICD-10-CM

## 2018-11-10 DIAGNOSIS — J309 Allergic rhinitis, unspecified: Secondary | ICD-10-CM | POA: Diagnosis not present

## 2018-11-10 DIAGNOSIS — J4521 Mild intermittent asthma with (acute) exacerbation: Secondary | ICD-10-CM | POA: Diagnosis not present

## 2018-11-10 DIAGNOSIS — J111 Influenza due to unidentified influenza virus with other respiratory manifestations: Secondary | ICD-10-CM

## 2018-11-10 LAB — POC INFLUENZA A&B (BINAX/QUICKVUE)
Influenza A, POC: POSITIVE — AB
Influenza B, POC: NEGATIVE

## 2018-11-10 MED ORDER — ALBUTEROL SULFATE HFA 108 (90 BASE) MCG/ACT IN AERS: 2.0000 | INHALATION_SPRAY | Freq: Four times a day (QID) | RESPIRATORY_TRACT | 2 refills | Status: DC | PRN

## 2018-11-10 MED ORDER — ALBUTEROL SULFATE (2.5 MG/3ML) 0.083% IN NEBU
2.5000 mg | INHALATION_SOLUTION | Freq: Once | RESPIRATORY_TRACT | Status: AC
  Administered 2018-11-10: 2.5 mg via RESPIRATORY_TRACT

## 2018-11-10 MED ORDER — AZITHROMYCIN 250 MG PO TABS: ORAL_TABLET | ORAL | 0 refills | Status: DC

## 2018-11-10 MED ORDER — MONTELUKAST SODIUM 10 MG PO TABS: 10.0000 mg | ORAL_TABLET | Freq: Every day | ORAL | 0 refills | Status: DC

## 2018-11-10 MED ORDER — FLUTICASONE PROPIONATE 50 MCG/ACT NA SUSP: 2.0000 | Freq: Every day | NASAL | 11 refills | Status: DC

## 2018-11-10 MED ORDER — FLUTICASONE PROPIONATE 50 MCG/ACT NA SUSP: 2.0000 | Freq: Every day | NASAL | 11 refills | Status: DC | PRN

## 2018-11-10 MED ORDER — OSELTAMIVIR PHOSPHATE 75 MG PO CAPS: 75.0000 mg | ORAL_CAPSULE | Freq: Two times a day (BID) | ORAL | 0 refills | Status: DC

## 2018-11-10 MED ORDER — PREDNISONE 20 MG PO TABS: 20.0000 mg | ORAL_TABLET | Freq: Every day | ORAL | 0 refills | Status: DC

## 2018-11-10 MED ORDER — IPRATROPIUM BROMIDE 0.02 % IN SOLN
0.5000 mg | Freq: Once | RESPIRATORY_TRACT | Status: AC
  Administered 2018-11-10: 0.5 mg via RESPIRATORY_TRACT

## 2018-11-10 MED ORDER — NYSTATIN 100000 UNIT/ML MT SUSP: 5.0000 mL | Freq: Four times a day (QID) | OROMUCOSAL | 0 refills | Status: DC

## 2018-11-10 MED ORDER — BECLOMETHASONE DIPROP HFA 40 MCG/ACT IN AERB: 1.0000 | INHALATION_SPRAY | Freq: Two times a day (BID) | RESPIRATORY_TRACT | 2 refills | Status: DC

## 2018-11-10 NOTE — Progress Notes (Signed)
pocPre visit review using our clinic review tool, if applicable. No additional management support is needed unless otherwise documented below in the visit note.

## 2018-11-10 NOTE — Progress Notes (Signed)
Chief Complaint  Patient presents with  . Cough  . URI   Sick visit  1. C/o cough worse sent Saturday, sob O2 sat at home 87-88 today 94%, wheezing. Coworkers have been sick. She has new dx of reactive/airway disease vs asthma. Nothing tried other than allegra or allegra . She also c/o sore throat, stuffy nose, sweating and temp to 100. Did not get flu shot this year   Review of Systems  Constitutional: Negative for weight loss.  HENT: Positive for congestion. Negative for hearing loss.   Eyes: Negative for blurred vision.  Respiratory: Positive for cough, shortness of breath and wheezing.   Cardiovascular: Negative for chest pain.  Skin: Negative for rash.  Neurological: Negative for headaches.   Past Medical History:  Diagnosis Date  . Abnormal Pap smear of cervix   . Anxiety   . Back pain   . Cervical dysplasia   . Depression   . Elevated WBCs    NEGATIVE HEM W/U  . GERD (gastroesophageal reflux disease)   . Hypothyroid    Past Surgical History:  Procedure Laterality Date  . COLPOSCOPY    . GYNECOLOGIC CRYOSURGERY     Family History  Problem Relation Age of Onset  . Parkinsonism Father   . Diabetes Father   . Hypertension Father   . Cancer Maternal Aunt        SOFT CELL SARCOMA  . Cancer Maternal Uncle        TESTICULAR  . Cancer Paternal Grandmother        COLON  . Cancer Son        TESTICULAR   Social History   Socioeconomic History  . Marital status: Single    Spouse name: Not on file  . Number of children: Not on file  . Years of education: Not on file  . Highest education level: Not on file  Occupational History  . Not on file  Social Needs  . Financial resource strain: Not on file  . Food insecurity:    Worry: Not on file    Inability: Not on file  . Transportation needs:    Medical: Not on file    Non-medical: Not on file  Tobacco Use  . Smoking status: Never Smoker  . Smokeless tobacco: Never Used  Substance and Sexual Activity  .  Alcohol use: Yes    Comment: OCCASSIONALLY  . Drug use: No  . Sexual activity: Not Currently  Lifestyle  . Physical activity:    Days per week: Not on file    Minutes per session: Not on file  . Stress: Not on file  Relationships  . Social connections:    Talks on phone: Not on file    Gets together: Not on file    Attends religious service: Not on file    Active member of club or organization: Not on file    Attends meetings of clubs or organizations: Not on file    Relationship status: Not on file  . Intimate partner violence:    Fear of current or ex partner: Not on file    Emotionally abused: Not on file    Physically abused: Not on file    Forced sexual activity: Not on file  Other Topics Concern  . Not on file  Social History Narrative  . Not on file   Current Meds  Medication Sig  . albuterol (PROVENTIL HFA;VENTOLIN HFA) 108 (90 Base) MCG/ACT inhaler Inhale 2 puffs into the lungs every  6 (six) hours as needed for wheezing or shortness of breath.  . ALPRAZolam (XANAX) 0.5 MG tablet Take 0.5 mg by mouth as needed.    . ARIPiprazole (ABILIFY) 5 MG tablet 1 tablet daily.  . beclomethasone (QVAR REDIHALER) 40 MCG/ACT inhaler Inhale 1 puff into the lungs 2 (two) times daily.  . carisoprodol (SOMA) 350 MG tablet Take 1 tablet (350 mg total) by mouth 3 (three) times daily as needed.  Marland Kitchen escitalopram (LEXAPRO) 5 MG tablet Take 5 mg by mouth daily.  Marland Kitchen ibuprofen (ADVIL,MOTRIN) 200 MG tablet Take 200 mg by mouth every 6 (six) hours as needed.    Marland Kitchen levothyroxine (SYNTHROID, LEVOTHROID) 175 MCG tablet Take 1 tablet (175 mcg total) by mouth daily before breakfast.  . norelgestromin-ethinyl estradiol Marilu Favre) 150-35 MCG/24HR transdermal patch apply 1 patch and replace weekly for 3 weeks  . Vitamin D, Ergocalciferol, (DRISDOL) 50000 units CAPS capsule Take 1 capsule (50,000 Units total) by mouth every 7 (seven) days.   Allergies  Allergen Reactions  . Amoxicillin Other (See Comments)     Dizziness  . Other     TREES   Recent Results (from the past 2160 hour(s))  CBC     Status: Abnormal   Collection Time: 08/26/18  9:34 AM  Result Value Ref Range   WBC 11.6 (H) 4.0 - 10.5 K/uL   RBC 4.54 3.87 - 5.11 Mil/uL   Platelets 376.0 150.0 - 400.0 K/uL   Hemoglobin 12.5 12.0 - 15.0 g/dL   HCT 38.8 36.0 - 46.0 %   MCV 85.4 78.0 - 100.0 fl   MCHC 32.1 30.0 - 36.0 g/dL   RDW 15.9 (H) 11.5 - 15.5 %  TSH     Status: Abnormal   Collection Time: 08/26/18  9:34 AM  Result Value Ref Range   TSH 11.20 (H) 0.35 - 4.50 uIU/mL  Comp Met (CMET)     Status: Abnormal   Collection Time: 08/26/18  9:34 AM  Result Value Ref Range   Sodium 137 135 - 145 mEq/L   Potassium 4.0 3.5 - 5.1 mEq/L   Chloride 103 96 - 112 mEq/L   CO2 24 19 - 32 mEq/L   Glucose, Bld 114 (H) 70 - 99 mg/dL   BUN 11 6 - 23 mg/dL   Creatinine, Ser 0.58 0.40 - 1.20 mg/dL   Total Bilirubin 0.2 0.2 - 1.2 mg/dL   Alkaline Phosphatase 63 39 - 117 U/L   AST 16 0 - 37 U/L   ALT 11 0 - 35 U/L   Total Protein 6.8 6.0 - 8.3 g/dL   Albumin 3.5 3.5 - 5.2 g/dL   Calcium 8.4 8.4 - 10.5 mg/dL   GFR 116.95 >60.00 mL/min  HgB A1c     Status: Abnormal   Collection Time: 08/26/18  9:34 AM  Result Value Ref Range   Hgb A1c MFr Bld 6.8 (H) 4.6 - 6.5 %    Comment: Glycemic Control Guidelines for People with Diabetes:Non Diabetic:  <6%Goal of Therapy: <7%Additional Action Suggested:  >8%   B Nat Peptide     Status: None   Collection Time: 08/26/18  9:34 AM  Result Value Ref Range   Pro B Natriuretic peptide (BNP) 19.0 0.0 - 100.0 pg/mL  TSH     Status: Abnormal   Collection Time: 10/06/18  9:01 AM  Result Value Ref Range   TSH 8.69 (H) 0.35 - 4.50 uIU/mL   Objective  Body mass index is 46.68 kg/m. Wt Readings from  Last 3 Encounters:  11/10/18 239 lb (108.4 kg)  08/26/18 249 lb (112.9 kg)  02/21/18 228 lb 6.4 oz (103.6 kg)   Temp Readings from Last 3 Encounters:  11/10/18 97.8 F (36.6 C) (Oral)  08/26/18 98.7 F  (37.1 C) (Oral)  02/21/18 98.5 F (36.9 C) (Oral)   BP Readings from Last 3 Encounters:  11/10/18 (!) 142/88  08/26/18 120/78  02/21/18 110/72   Pulse Readings from Last 3 Encounters:  11/10/18 96  08/26/18 89  02/21/18 79    Physical Exam Vitals signs and nursing note reviewed.  Constitutional:      Appearance: Normal appearance. She is well-developed and well-groomed.  HENT:     Head: Normocephalic and atraumatic.     Nose: Nose normal.     Mouth/Throat:     Mouth: Mucous membranes are moist.     Pharynx: Oropharynx is clear.     Comments: Thrush on tongue   Eyes:     Conjunctiva/sclera: Conjunctivae normal.     Pupils: Pupils are equal, round, and reactive to light.  Cardiovascular:     Rate and Rhythm: Normal rate and regular rhythm.     Heart sounds: Normal heart sounds.  Pulmonary:     Effort: Pulmonary effort is normal.     Breath sounds: Wheezing present.  Skin:    General: Skin is warm and dry.  Neurological:     General: No focal deficit present.     Mental Status: She is alert and oriented to person, place, and time. Mental status is at baseline.     Gait: Gait normal.  Psychiatric:        Attention and Perception: Attention and perception normal.        Mood and Affect: Mood and affect normal.        Speech: Speech normal.        Behavior: Behavior normal. Behavior is cooperative.        Thought Content: Thought content normal.        Cognition and Memory: Cognition and memory normal.        Judgment: Judgment normal.     Assessment   1. C/w RAD vs asthma with exacerbation 2/2 URI r/o flu +  2. Thrush tongue  Plan  1. duoneb x 1  Zpack, prednisone 20 mg x 7-10 days  flonase add singulair  Cont allegra  Qvar increase 40 1 puff bid to 2 puffs bid  Pt did not get flu shot  tamiflu bid x 5 days 2. Nystatin swish and spit    Provider: Dr. Olivia Mackie McLean-Scocuzza-[facility]

## 2018-11-10 NOTE — Patient Instructions (Addendum)
Temporarily increase Qvar to 2 puffs 2x per day and rinse mouth with other medications   Asthma Attack Prevention, Adult Although you may not be able to control the fact that you have asthma, you can take actions to prevent episodes of asthma (asthma attacks). These actions include:  Creating a written plan for managing and treating your asthma attacks (asthma action plan).  Monitoring your asthma.  Avoiding things that can irritate your airways or make your asthma symptoms worse (asthma triggers).  Taking your medicines as directed.  Acting quickly if you have signs or symptoms of an asthma attack. What are some ways to prevent an asthma attack? Create a plan Work with your health care provider to create an asthma action plan. This plan should include:  A list of your asthma triggers and how to avoid them.  A list of symptoms that you experience during an asthma attack.  Information about when to take medicine and how much medicine to take.  Information to help you understand your peak flow measurements.  Contact information for your health care providers.  Daily actions that you can take to control asthma. Monitor your asthma To monitor your asthma:  Use your peak flow meter every morning and every evening for 2-3 weeks. Record the results in a journal. A drop in your peak flow numbers on one or more days may mean that you are starting to have an asthma attack, even if you are not having symptoms.  When you have asthma symptoms, write them down in a journal.  Avoid asthma triggers Work with your health care provider to find out what your asthma triggers are. This can be done by:  Being tested for allergies.  Keeping a journal that notes when asthma attacks occur and what may have contributed to them.  Asking your health care provider whether other medical conditions make your asthma worse. Common asthma triggers include:  Dust.  Smoke. This includes campfire smoke  and secondhand smoke from tobacco products.  Pet dander.  Trees, grasses or pollens.  Very cold, dry, or humid air.  Mold.  Foods that contain high amounts of sulfites.  Strong smells.  Engine exhaust and air pollution.  Aerosol sprays and fumes from household cleaners.  Household pests and their droppings, including dust mites and cockroaches.  Certain medicines, including NSAIDs. Once you have determined your asthma triggers, take steps to avoid them. Depending on your triggers, you may be able to reduce the chance of an asthma attack by:  Keeping your home clean. Have someone dust and vacuum your home for you 1 or 2 times a week. If possible, have them use a high-efficiency particulate arrestance (HEPA) vacuum.  Washing your sheets weekly in hot water.  Using allergy-proof mattress covers and casings on your bed.  Keeping pets out of your home.  Taking care of mold and water problems in your home.  Avoiding areas where people smoke.  Avoiding using strong perfumes or odor sprays.  Avoid spending a lot of time outdoors when pollen counts are high and on very windy days.  Talking with your health care provider before stopping or starting any new medicines. Medicines Take over-the-counter and prescription medicines only as told by your health care provider. Many asthma attacks can be prevented by carefully following your medicine schedule. Taking your medicines correctly is especially important when you cannot avoid certain asthma triggers. Even if you are doing well, do not stop taking your medicine and do not take less  medicine. Act quickly If an asthma attack happens, acting quickly can decrease how severe it is and how long it lasts. Take these actions:  Pay attention to your symptoms. If you are coughing, wheezing, or having difficulty breathing, do not wait to see if your symptoms go away on their own. Follow your asthma action plan.  If you have followed your  asthma action plan and your symptoms are not improving, call your health care provider or seek immediate medical care at the nearest hospital. It is important to write down how often you need to use your fast-acting rescue inhaler. You can track how often you use an inhaler in your journal. If you are using your rescue inhaler more often, it may mean that your asthma is not under control. Adjusting your asthma treatment plan may help you to prevent future asthma attacks and help you to gain better control of your condition. How can I prevent an asthma attack when I exercise? Exercise is a common asthma trigger. To prevent asthma attacks during exercise:  Follow advice from your health care provider about whether you should use your fast-acting inhaler before exercising. Many people with asthma experience exercise-induced bronchoconstriction (EIB). This condition often worsens during vigorous exercise in cold, humid, or dry environments. Usually, people with EIB can stay very active by using a fast-acting inhaler before exercising.  Avoid exercising outdoors in very cold or humid weather.  Avoid exercising outdoors when pollen counts are high.  Warm up and cool down when exercising.  Stop exercising right away if asthma symptoms start. Consider taking part in exercises that are less likely to cause asthma symptoms such as:  Indoor swimming.  Biking.  Walking.  Hiking.  Playing football. This information is not intended to replace advice given to you by your health care provider. Make sure you discuss any questions you have with your health care provider. Document Released: 08/26/2009 Document Revised: 05/08/2016 Document Reviewed: 02/22/2016 Elsevier Interactive Patient Education  2019 [employer].    Asthma, Adult  Asthma is a long-term (chronic) condition that causes recurrent episodes in which the airways become tight and narrow. The airways are the passages that lead from the  nose and mouth down into the lungs. Asthma episodes, also called asthma attacks, can cause coughing, wheezing, shortness of breath, and chest pain. The airways can also fill with mucus. During an attack, it can be difficult to breathe. Asthma attacks can range from minor to life threatening. Asthma cannot be cured, but medicines and lifestyle changes can help control it and treat acute attacks. What are the causes? This condition is believed to be caused by inherited (genetic) and environmental factors, but its exact cause is not known. There are many things that can bring on an asthma attack or make asthma symptoms worse (triggers). Asthma triggers are different for each person. Common triggers include:  Mold.  Dust.  Cigarette smoke.  Cockroaches.  Things that can cause allergy symptoms (allergens), such as animal dander or pollen from trees or grass.  Air pollutants such as household cleaners, wood smoke, smog, or Advertising account planner.  Cold air, weather changes, and winds (which increase molds and pollen in the air).  Strong emotional expressions such as crying or laughing hard.  Stress.  Certain medicines (such as aspirin) or types of medicines (such as beta-blockers).  Sulfites in foods and drinks. Foods and drinks that may contain sulfites include dried fruit, potato chips, and sparkling grape juice.  Infections or inflammatory conditions such  as the flu, a cold, or inflammation of the nasal membranes (rhinitis).  Gastroesophageal reflux disease (GERD).  Exercise or strenuous activity. What are the signs or symptoms? Symptoms of this condition may occur right after asthma is triggered or many hours later. Symptoms include:  Wheezing. This can sound like whistling when you breathe.  Excessive nighttime or early morning coughing.  Frequent or severe coughing with a common cold.  Chest tightness.  Shortness of breath.  Tiredness (fatigue) with minimal activity. How is  this diagnosed? This condition is diagnosed based on:  Your medical history.  A physical exam.  Tests, which may include: ? Lung function studies and pulmonary studies (spirometry). These tests can evaluate the flow of air in your lungs. ? Allergy tests. ? Imaging tests, such as X-rays. How is this treated? There is no cure for this condition, but treatment can help control your symptoms. Treatment for asthma usually involves:  Identifying and avoiding your asthma triggers.  Using medicines to control your symptoms. Generally, two types of medicines are used to treat asthma: ? Controller medicines. These help prevent asthma symptoms from occurring. They are usually taken every day. ? Fast-acting reliever or rescue medicines. These quickly relieve asthma symptoms by widening the narrow and tight airways. They are used as needed and provide short-term relief.  Using supplemental oxygen. This may be needed during a severe episode.  Using other medicines, such as: ? Allergy medicines, such as antihistamines, if your asthma attacks are triggered by allergens. ? Immune medicines (immunomodulators). These are medicines that help control the immune system.  Creating an asthma action plan. An asthma action plan is a written plan for managing and treating your asthma attacks. This plan includes: ? A list of your asthma triggers and how to avoid them. ? Information about when medicines should be taken and when their dosage should be changed. ? Instructions about using a device called a peak flow meter. A peak flow meter measures how well the lungs are working and the severity of your asthma. It helps you monitor your condition. Follow these instructions at home: Controlling your home environment Control your home environment in the following ways to help avoid triggers and prevent asthma attacks:  Change your heating and air conditioning filter regularly.  Limit your use of fireplaces and  wood stoves.  Get rid of pests (such as roaches and mice) and their droppings.  Throw away plants if you see mold on them.  Clean floors and dust surfaces regularly. Use unscented cleaning products.  Try to have someone else vacuum for you regularly. Stay out of rooms while they are being vacuumed and for a short while afterward. If you vacuum, use a dust mask from a hardware store, a double-layered or microfilter vacuum cleaner bag, or a vacuum cleaner with a HEPA filter.  Replace carpet with wood, tile, or vinyl flooring. Carpet can trap dander and dust.  Use allergy-proof pillows, mattress covers, and box spring covers.  Keep your bedroom a trigger-free room.  Avoid pets and keep windows closed when allergens are in the air.  Wash beddings every week in hot water and dry them in a dryer.  Use blankets that are made of polyester or cotton.  Clean bathrooms and kitchens with bleach. If possible, have someone repaint the walls in these rooms with mold-resistant paint. Stay out of the rooms that are being cleaned and painted.  Wash your hands often with soap and water. If soap and water are not  available, use hand sanitizer.  Do not allow anyone to smoke in your home. General instructions  Take over-the-counter and prescription medicines only as told by your health care provider. ? Speak with your health care provider if you have questions about how or when to take the medicines. ? Make note if you are requiring more frequent dosages.  Do not use any products that contain nicotine or tobacco, such as cigarettes and e-cigarettes. If you need help quitting, ask your health care provider. Also, avoid being exposed to secondhand smoke.  Use a peak flow meter as told by your health care provider. Record and keep track of the readings.  Understand and use the asthma action plan to help minimize, or stop an asthma attack, without needing to seek medical care.  Make sure you stay up to  date on your yearly vaccinations as told by your health care provider. This may include vaccines for the flu and pneumonia.  Avoid outdoor activities when allergen counts are high and when air quality is low.  Wear a ski mask that covers your nose and mouth during outdoor winter activities. Exercise indoors on cold days if you can.  Warm up before exercising, and take time for a cool-down period after exercise.  Keep all follow-up visits as told by your health care provider. This is important. Where to find more information  For information about asthma, turn to the Centers for Disease Control and Prevention at http://www.clark.net/.htm  For air quality information, turn to [employer] at WeightRating.nl Contact a health care provider if:  You have wheezing, shortness of breath, or a cough even while you are taking medicine to prevent attacks.  The mucus you cough up (sputum) is thicker than usual.  Your sputum changes from clear or white to yellow, green, gray, or bloody.  Your medicines are causing side effects, such as a rash, itching, swelling, or trouble breathing.  You need to use a reliever medicine more than 2-3 times a week.  Your peak flow reading is still at 50-79% of your personal best after following your action plan for 1 hour.  You have a fever. Get help right away if:  You are getting worse and do not respond to treatment during an asthma attack.  You are short of breath when at rest or when doing very little physical activity.  You have difficulty eating, drinking, or talking.  You have chest pain or tightness.  You develop a fast heartbeat or palpitations.  You have a bluish color to your lips or fingernails.  You are light-headed or dizzy, or you faint.  Your peak flow reading is less than 50% of your personal best.  You feel too tired to breathe normally. Summary  Asthma is a long-term (chronic) condition that causes recurrent episodes in which  the airways become tight and narrow. These episodes can cause coughing, wheezing, shortness of breath, and chest pain.  Asthma cannot be cured, but medicines and lifestyle changes can help control it and treat acute attacks.  Make sure you understand how to avoid triggers and how and when to use your medicines.  Asthma attacks can range from minor to life threatening. Get help right away if you have an asthma attack and do not respond to treatment with your usual rescue medicines. This information is not intended to replace advice given to you by your health care provider. Make sure you discuss any questions you have with your health care provider. Document Released: 09/07/2005 Document Revised:  10/12/2016 Document Reviewed: 10/12/2016 Elsevier Interactive Patient Education  [employer].

## 2018-11-13 NOTE — Progress Notes (Signed)
Cardiology Office Note  Date:  11/14/2018   ID:  Tina Moreno, DOB [birth year], MRN [id number]  PCP:  Leone Haven, MD   Chief Complaint  Patient presents with  . Other    Per Caryl Bis for papitations, SOB and swelling in ankles. Meds reviewed verbally with patient.     HPI:  Tina Moreno is a 53 year old woman with past medical history of Hypothyroidism Prediabetes Anxiety Morbid obesity, up 30 pounds in one year Referred by Dr. Caryl Bis for consultation of her palpitations, runs of breath and leg swelling  Leg swelling started last summer 2019 when she was driving to [place], [place] When she arrived at her destination reported having leg swelling Similar symptoms driving home Has had episodes since then coming and going depending on what she is doing Works in an office, lots of sitting  Discussion on her weight increase over the past year or so Reports it is been dramatic Does not know if it is a change in her diet or other circumstances  Reports having a occasional fluttering in her chest Extra beat sometimes with a pause Has noticed this on a pulse oximeter   chronic dyspnea on exertion that is worse than usual.    Reports that she drinks lots of water  Cardiac risk factors reviewed with her, cholesterol reasonably well controlled LDL 100 Non-smoker, hemoglobin A1c elevated 6.8  EKG personally reviewed by myself on todays visit Shows normal sinus rhythm rate 81 bpm no significant ST or T wave changes  PMH:   has a past medical history of Abnormal Pap smear of cervix, Anxiety, Back pain, Cervical dysplasia, Depression, Elevated WBCs, GERD (gastroesophageal reflux disease), and Hypothyroid.  PSH:    Past Surgical History:  Procedure Laterality Date  . COLPOSCOPY    . GYNECOLOGIC CRYOSURGERY      Current Outpatient Medications  Medication Sig Dispense Refill  . albuterol (PROVENTIL HFA;VENTOLIN HFA) 108 (90 Base) MCG/ACT inhaler  Inhale 2 puffs into the lungs every 6 (six) hours as needed for wheezing or shortness of breath. 1 Inhaler 2  . ALPRAZolam (XANAX) 0.5 MG tablet Take 0.5 mg by mouth as needed.      . ARIPiprazole (ABILIFY) 5 MG tablet 1 tablet daily.    Marland Kitchen azithromycin (ZITHROMAX) 250 MG tablet 2 pills day 1 and 1 pill day 2-5 6 tablet 0  . beclomethasone (QVAR REDIHALER) 40 MCG/ACT inhaler Inhale 1 puff into the lungs 2 (two) times daily. Rinse mouth if get sick 2 puffs bid 1 Inhaler 2  . carisoprodol (SOMA) 350 MG tablet Take 1 tablet (350 mg total) by mouth 3 (three) times daily as needed. 30 tablet 0  . fluticasone (FLONASE) 50 MCG/ACT nasal spray Place 2 sprays into both nostrils daily as needed for allergies or rhinitis. 16 g 11  . ibuprofen (ADVIL,MOTRIN) 200 MG tablet Take 200 mg by mouth every 6 (six) hours as needed.      Marland Kitchen levothyroxine (SYNTHROID, LEVOTHROID) 175 MCG tablet Take 1 tablet (175 mcg total) by mouth daily before breakfast. 90 tablet 1  . montelukast (SINGULAIR) 10 MG tablet Take 1 tablet (10 mg total) by mouth at bedtime. For allergies 30 tablet 0  . norelgestromin-ethinyl estradiol Marilu Favre) 150-35 MCG/24HR transdermal patch apply 1 patch and replace weekly for 3 weeks 3 Package 4  . nystatin (MYCOSTATIN) 100000 UNIT/ML suspension Take 5 mLs (500,000 Units total) by mouth 4 (four) times daily. X 7-10 days 473 mL 0  . oseltamivir (  TAMIFLU) 75 MG capsule Take 1 capsule (75 mg total) by mouth 2 (two) times daily. 10 capsule 0  . predniSONE (DELTASONE) 20 MG tablet Take 1 tablet (20 mg total) by mouth daily with breakfast. X 7-10 days with food in am 10 tablet 0  . Vitamin D, Ergocalciferol, (DRISDOL) 50000 units CAPS capsule Take 1 capsule (50,000 Units total) by mouth every 7 (seven) days. 30 capsule 6   No current facility-administered medications for this visit.      Allergies:   Amoxicillin and Other   Social History:  The patient  reports that she has never smoked. She has never used  smokeless tobacco. She reports current alcohol use. She reports that she does not use drugs.   Family History:   family history includes Cancer in her maternal aunt, maternal uncle, paternal grandmother, and son; Diabetes in her father; Hypertension in her father; Parkinsonism in her father.    Review of Systems: Review of Systems  Constitutional: Negative.        Weight gain  Respiratory: Positive for shortness of breath.   Cardiovascular: Positive for palpitations and leg swelling.  Gastrointestinal: Negative.   Musculoskeletal: Negative.   Neurological: Negative.   Psychiatric/Behavioral: Negative.   All other systems reviewed and are negative.    PHYSICAL EXAM: VS:  BP 123/87 (BP Location: Left Arm, Patient Position: Sitting, Cuff Size: Large)   Pulse 81   Ht 5' (1.524 m)   Wt 241 lb (109.3 kg)   BMI 47.07 kg/m  , BMI Body mass index is 47.07 kg/m. GEN: Well nourished, well developed, in no acute distress , obese HEENT: normal  Neck: no JVD, carotid bruits, or masses Cardiac: RRR; no murmurs, rubs, or gallops,no edema  Respiratory:  clear to auscultation bilaterally, normal work of breathing GI: soft, nontender, nondistended, + BS MS: no deformity or atrophy  Skin: warm and dry, no rash Neuro:  Strength and sensation are intact Psych: euthymic mood, full affect    Recent Labs: 08/26/2018: ALT 11; BUN 11; Creatinine, Ser 0.58; Hemoglobin 12.5; Platelets 376.0; Potassium 4.0; Pro B Natriuretic peptide (BNP) 19.0; Sodium 137 10/06/2018: TSH 8.69    Lipid Panel Lab Results  Component Value Date   CHOL 178 04/12/2017   HDL 50.80 04/12/2017   LDLCALC 99 04/12/2017   TRIG 138.0 04/12/2017      Wt Readings from Last 3 Encounters:  11/14/18 241 lb (109.3 kg)  11/10/18 239 lb (108.4 kg)  08/26/18 249 lb (112.9 kg)       ASSESSMENT AND PLAN:  Morbid obesity ([place]) Long discussion concerning her diet Dietary guide provided Recommend a low carbohydrate diet,  increasing her exercise at work, walking during breaks.,  Avoiding high carbohydrate foods at work  Palpitations - Plan: EKG 12-Lead Likely having APCs or PVCs Discussed with her in detail, images pulled up in the office on the computer If symptoms get worse could wear a monitor, ZIO Discussed types of medications including propranolol and metoprolol  DOE (dyspnea on exertion) Normal clinical exam, normal EKG Few risk factors for coronary disease Shortness of breath likely secondary to tremendous weight gain over the past year and deconditioning Echocardiogram was offered, this can be ordered if she chooses for any worsening symptoms  Anxiety and depression Managed by primary care  Pre-diabetes Elevated hemoglobin A1c 6.8, recommended low carbohydrate diet, walking program, weight loss  Leg swelling Secondary to weight gain Minimal edema on today's visit, low BNP  Disposition:   F/U as  needed   Total encounter time more than 60 minutes  Greater than 50% was spent in counseling and coordination of care with the patient  Patient was seen in consultation for Dr. Caryl Bis and will be referred back to his office for ongoing care of the issues detailed above   Orders Placed This Encounter  Procedures  . EKG 12-Lead     Signed, Esmond Plants, M.D., Ph.D. 11/14/2018  [place], [place]

## 2018-11-14 ENCOUNTER — Ambulatory Visit (INDEPENDENT_AMBULATORY_CARE_PROVIDER_SITE_OTHER): Payer: BLUE CROSS/BLUE SHIELD | Admitting: Cardiovascular Disease

## 2018-11-14 ENCOUNTER — Encounter: Payer: Self-pay | Admitting: Cardiovascular Disease

## 2018-11-14 DIAGNOSIS — M7989 Other specified soft tissue disorders: Secondary | ICD-10-CM | POA: Insufficient documentation

## 2018-11-14 DIAGNOSIS — F419 Anxiety disorder, unspecified: Secondary | ICD-10-CM | POA: Diagnosis not present

## 2018-11-14 DIAGNOSIS — R0609 Other forms of dyspnea: Secondary | ICD-10-CM | POA: Diagnosis not present

## 2018-11-14 DIAGNOSIS — R002 Palpitations: Secondary | ICD-10-CM | POA: Diagnosis not present

## 2018-11-14 DIAGNOSIS — F329 Major depressive disorder, single episode, unspecified: Secondary | ICD-10-CM

## 2018-11-14 DIAGNOSIS — R7303 Prediabetes: Secondary | ICD-10-CM

## 2018-11-14 DIAGNOSIS — F32A Depression, unspecified: Secondary | ICD-10-CM

## 2018-11-14 NOTE — Patient Instructions (Addendum)
Research PAC (premature atrial contraction) PVC (premature ventricular contraction)   Medication Instructions:  No changes  If you need a refill on your cardiac medications before your next appointment, please call your pharmacy.    Lab work: No new labs needed   If you have labs (blood work) drawn today and your tests are completely normal, you will receive your results only by: Marland Kitchen MyChart Message (if you have MyChart) OR . A paper copy in the mail If you have any lab test that is abnormal or we need to change your treatment, we will call you to review the results.   Testing/Procedures: No new testing needed   Follow-Up: At [facility], you and your health needs are our priority.  As part of our continuing mission to provide you with exceptional heart care, we have created designated Provider Care Teams.  These Care Teams include your primary Cardiologist (physician) and Advanced Practice Providers (APPs -  Physician Assistants and Nurse Practitioners) who all work together to provide you with the care you need, when you need it.  . You will need a follow up appointment   . Providers on your designated Care Team:   . Murray Hodgkins, NP . Christell Faith, PA-C . Marrianne Mood, PA-C  Any Other Special Instructions Will Be Listed Below (If Applicable).  For educational health videos Log in to : [email or web address] Or : [email or web address], password : [email or web address]   Premature Atrial Contraction  A premature atrial contraction (PAC) is a kind of irregular heartbeat (arrhythmia). It happens when the heart beats too early and then pauses before beating again. The heart has four areas, or chambers. Normally, electrical signals spread across the heart and make all the chambers beat together. During a PAC, the upper chambers of the heart (atria) beat too early, before they have had time to fill with blood. The heartbeat pauses afterward so the heart can fill with blood for the next  beat. Sometimes PAC can be a warning sign of another type of arrhythmia called atrial fibrillation. Atrial fibrillation may allow blood to pool in the atria and form clots. If a clot travels to the brain, it can cause a stroke. What are the causes? The cause of this condition is often unknown. Sometimes it is caused by heart disease or injury to the heart. What increases the risk? This condition is more likely to develop in children and in adults who are 63 years of age or older. Episodes may be triggered by:  Caffeine.  Alcohol.  Tobacco use.  Stimulant drugs.  Some medicines or supplements.  Stress.  Heart disease. What are the signs or symptoms? Symptoms of this condition include:  A feeling that your heart skipped a beat. The first heartbeat after the "skipped" beat may feel more forceful.  A feeling that your heart is fluttering. How is this diagnosed? This condition is diagnosed based on:  Your symptoms.  A physical exam. Your health care provider may listen to your heart.  Electrocardiogram (ECG). This is a test that records the electrical impulses of the heart.  Ambulatory cardiac monitor. This device records your heartbeats for 24 hours or more. You may also have:  An echocardiogram to check for any heart conditions. This is a type of imaging test that uses sound waves (ultrasound) to make images of your heart.  Blood tests. How is this treated? Treatment depends on the frequency of your symptoms and other risk factors. Treatments may include:  Medicines (beta-blockers).  Catheter ablation.  This is done to destroy the part of the heart tissue that sends abnormal signals. In some cases, treatment may not be needed for this condition. Follow these instructions at home: Alcohol use  Do not drink alcohol if: ? Your health care provider tells you not to drink. ? You are pregnant, may be pregnant, or are planning to become pregnant. ? Alcohol triggers your  episodes.  If you drink alcohol, limit how much you have. You may drink: ? 0-1 drink a day for women. ? 0-2 drinks a day for men.  Be aware of how much alcohol is in your drink. In the [place] one drink equals one typical bottle of beer (12 oz), one-half glass of wine (5 oz), or one shot of hard liquor (1 oz). General instructions  Do not use any products that contain nicotine or tobacco, such as cigarettes and e-cigarettes. If you need help quitting, ask your health care provider.  If caffeine triggers episodes, do not eat, drink, or use anything with caffeine in it.  Exercise regularly. Ask your health care provider what type of exercise is safe for you.  Find healthy ways to manage stress.  Try to get at least 7-9 hours of sleep each night, or as much as recommended by your health care provider.  Take over-the-counter and prescription medicines only as told by your health care provider.  Keep all follow-up visits as told by your health care provider. This is important. Contact a health care provider if:  You feel your heart skipping beats.  Your heart skips beats and you feel dizzy, light-headed, or very tired. Get help right away if you have:  Chest pain.  Trouble breathing.  Any symptoms of stroke. "BE FAST" is an easy way to remember the main warning signs of a stroke. ? B - Balance. Signs are dizziness, sudden trouble walking, or loss of balance. ? E - Eyes. Signs are trouble seeing or a sudden change in vision. ? F - Face. Signs are sudden weakness or numbness of the face, or the face or eyelid drooping on one side. ? A - Arms. Signs are weakness or numbness in an arm. This happens suddenly and usually on one side of the body. ? S - Speech. Signs are sudden trouble speaking, slurred speech, or trouble understanding what people say. ? T - Time. Time to call emergency services. Write down what time symptoms started.  Other signs of stroke, such as: ? A sudden, severe  headache with no known cause. ? Nausea or vomiting. ? Seizure. These symptoms may represent a serious problem that is an emergency. Do not wait to see if the symptoms will go away. Get medical help right away. Call your local emergency services (911 in the [place] Do not drive yourself to the hospital. Summary  A premature atrial contraction [facility]) is a kind of irregular heartbeat (arrhythmia). It happens when the heart beats too early and then pauses before beating again.  Treatment depends on your symptoms and whether you have other underlying heart conditions.  Contact a health care provider if your heart skips beats and you feel dizzy, light-headed, or very tired.  In some cases, this condition may lead to a stroke. "BE FAST" is an easy way to remember the warning signs of stroke. Get help right away if you have any of the "BE FAST" signs. This information is not intended to replace advice given to you by your health care provider. Make sure you discuss any  questions you have with your health care provider. Document Released: 05/11/2014 Document Revised: 04/22/2018 Document Reviewed: 10/26/2017 Elsevier Interactive Patient Education  2019 [employer]  Premature Ventricular Contraction  A premature ventricular contraction (PVC) is a common kind of irregular heartbeat (arrhythmia). These contractions are extra heartbeats that start in the ventricles of the heart and occur too early in the normal sequence. During the PVC, the heart's normal electrical pathway is not used, so the beat is shorter and less effective. In most cases, these contractions come and go and do not require treatment. What are the causes? Common causes of the condition include:  Smoking.  Drinking alcohol.  Certain medicines.  Some illegal drugs.  Stress.  Caffeine. Certain medical conditions can also cause PVCs:  Heart failure.  Heart attack, or coronary artery disease.  Heart valve problems.  Changes  in minerals in the blood (electrolytes).  Low blood oxygen levels or high carbon dioxide levels. In many cases, the cause of this condition is not known. What are the signs or symptoms? The main symptom of this condition is fast or skipped heartbeats (palpitations). Other symptoms include:  Chest pain.  Shortness of breath.  Feeling tired.  Dizziness.  Difficulty exercising. In some cases, there are no symptoms. How is this diagnosed? This condition may be diagnosed based on:  Your medical history.  A physical exam. During the exam, the health care provider will check for irregular heartbeats.  Tests, such as: ? An ECG (electrocardiogram) to monitor the electrical activity of your heart. ? Ambulatory cardiac monitor. This device records your heartbeats for 24 hours or more. ? Stress tests to see how exercise affects your heart rhythm and blood supply. ? Echocardiogram. This test uses sound waves (ultrasound) to produce an image of your heart. ? Electrophysiology study (EPS). This test checks for electrical problems in your heart. How is this treated? Treatment for this condition depends on any underlying conditions, the type of PVCs that you are having, and how much the symptoms are interfering with your daily life. Possible treatments include:  Avoiding things that cause premature contractions (triggers). These include caffeine and alcohol.  Taking medicines if symptoms are severe or if the extra heartbeats are frequent.  Getting treatment for underlying conditions that cause PVCs.  Having an implantable cardioverter defibrillator (ICD), if you are at risk for a serious arrhythmia. The ICD is a small device that is inserted into your chest to monitor your heartbeat. When it senses an irregular heartbeat, it sends a shock to bring the heartbeat back to normal.  Having a procedure to destroy the portion of the heart tissue that sends out abnormal signals (catheter  ablation). In some cases, no treatment is required. Follow these instructions at home: Alcohol use   Do not drink alcohol if: ? Your health care provider tells you not to drink. ? You are pregnant, may be pregnant, or are planning to become pregnant. ? Alcohol triggers your episodes.  If you drink alcohol, limit how much you have. You may drink: ? 0-1 drink a day for women. ? 0-2 drinks a day for men.  Be aware of how much alcohol is in your drink. In the [place] one drink equals one typical bottle of beer (12 oz), one-half glass of wine (5 oz), or one shot of hard liquor (1 oz). General instructions  Do not use any products that contain nicotine or tobacco, such as cigarettes and e-cigarettes. If you need help quitting, ask your health care  provider.  Find healthy ways to manage stress. Avoid stressful situations when possible.  Exercise regularly. Ask your health care provider what type of exercise is safe for you.  Try to get at least 7-9 hours of sleep each night, or as much as recommended by your health care provider.  If caffeine triggers episodes of PVC, do not eat, drink, or use anything with caffeine in it.  Do not use illegal drugs.  Take over-the-counter and prescription medicines only as told by your health care provider.  Keep all follow-up visits as told by your health care provider. This is important. Contact a health care provider if you:  Feel palpitations. Get help right away if you:  Have chest pain.  Have shortness of breath.  Have sweating for no reason.  Have nausea and vomiting.  Become light-headed or you faint. Summary  A premature ventricular contraction (PVC) is a common kind of irregular heartbeat (arrhythmia).  In most cases, these contractions come and go and do not require treatment.  You may need to wear an ambulatory cardiac monitor. This records your heartbeats for 24 hours or more.  Treatment depends on any underlying  conditions, the type of PVCs that you are having, and how much the symptoms are interfering with your daily life. This information is not intended to replace advice given to you by your health care provider. Make sure you discuss any questions you have with your health care provider. Document Released: 04/24/2004 Document Revised: 04/22/2018 Document Reviewed: 10/26/2017 Elsevier Interactive Patient Education  2019 [employer].  Palpitations Palpitations are feelings that your heartbeat is not normal. Your heartbeat may feel like it is:  Uneven.  Faster than normal.  Fluttering.  Skipping a beat. This is usually not a serious problem. In some cases, you may need tests to rule out any serious problems. Follow these instructions at home: Pay attention to any changes in your condition. Take these actions to help manage your symptoms: Eating and drinking  Avoid: ? Coffee, tea, soft drinks, and energy drinks. ? Chocolate. ? Alcohol. ? Diet pills. Lifestyle   Try to lower your stress. These things can help you relax: ? Yoga. ? Deep breathing and meditation. ? Exercise. ? Using words and images to create positive thoughts (guided imagery). ? Using your mind to control things in your body (biofeedback).  Do not use drugs.  Get plenty of rest and sleep. Keep a regular bed time. General instructions   Take over-the-counter and prescription medicines only as told by your doctor.  Do not use any products that contain nicotine or tobacco, such as cigarettes and e-cigarettes. If you need help quitting, ask your doctor.  Keep all follow-up visits as told by your doctor. This is important. You may need more tests if palpitations do not go away or get worse. Contact a doctor if:  Your symptoms last more than 24 hours.  Your symptoms occur more often. Get help right away if you:  Have chest pain.  Feel short of breath.  Have a very bad headache.  Feel dizzy.  Pass out  (faint). Summary  Palpitations are feelings that your heartbeat is uneven or faster than normal. It may feel like your heart is fluttering or skipping a beat.  Avoid food and drinks that may cause palpitations. These include caffeine, chocolate, and alcohol.  Try to lower your stress. Do not smoke or use drugs.  Get help right away if you faint or have chest pain, shortness of  breath, a severe headache, or dizziness. This information is not intended to replace advice given to you by your health care provider. Make sure you discuss any questions you have with your health care provider. Document Released: 06/16/2008 Document Revised: 10/20/2017 Document Reviewed: 10/20/2017 Elsevier Interactive Patient Education  2019 [employer].

## 2018-11-30 ENCOUNTER — Ambulatory Visit: Payer: BLUE CROSS/BLUE SHIELD | Admitting: Family Medicine

## 2018-11-30 ENCOUNTER — Other Ambulatory Visit: Payer: Self-pay

## 2018-11-30 ENCOUNTER — Encounter: Payer: Self-pay | Admitting: Family Medicine

## 2018-11-30 VITALS — BP 120/80 | HR 86 | Temp 98.1°F | Ht 60.0 in | Wt 243.4 lb

## 2018-11-30 DIAGNOSIS — D729 Disorder of white blood cells, unspecified: Secondary | ICD-10-CM | POA: Insufficient documentation

## 2018-11-30 DIAGNOSIS — R7303 Prediabetes: Secondary | ICD-10-CM

## 2018-11-30 DIAGNOSIS — M791 Myalgia, unspecified site: Secondary | ICD-10-CM

## 2018-11-30 DIAGNOSIS — M549 Dorsalgia, unspecified: Secondary | ICD-10-CM

## 2018-11-30 DIAGNOSIS — D72829 Elevated white blood cell count, unspecified: Secondary | ICD-10-CM

## 2018-11-30 DIAGNOSIS — J4530 Mild persistent asthma, uncomplicated: Secondary | ICD-10-CM | POA: Diagnosis not present

## 2018-11-30 DIAGNOSIS — E039 Hypothyroidism, unspecified: Secondary | ICD-10-CM

## 2018-11-30 DIAGNOSIS — J309 Allergic rhinitis, unspecified: Secondary | ICD-10-CM | POA: Diagnosis not present

## 2018-11-30 DIAGNOSIS — G8929 Other chronic pain: Secondary | ICD-10-CM

## 2018-11-30 LAB — CBC WITH DIFFERENTIAL/PLATELET
Basophils Absolute: 0.1 10*3/uL (ref 0.0–0.1)
Basophils Relative: 0.7 % (ref 0.0–3.0)
Eosinophils Absolute: 0.2 10*3/uL (ref 0.0–0.7)
Eosinophils Relative: 1.9 % (ref 0.0–5.0)
HCT: 39.3 % (ref 36.0–46.0)
Hemoglobin: 13.1 g/dL (ref 12.0–15.0)
Lymphocytes Relative: 30.6 % (ref 12.0–46.0)
Lymphs Abs: 3.8 10*3/uL (ref 0.7–4.0)
MCHC: 33.5 g/dL (ref 30.0–36.0)
MCV: 85.6 fl (ref 78.0–100.0)
Monocytes Absolute: 0.6 10*3/uL (ref 0.1–1.0)
Monocytes Relative: 4.5 % (ref 3.0–12.0)
Neutro Abs: 7.6 10*3/uL (ref 1.4–7.7)
Neutrophils Relative %: 62.3 % (ref 43.0–77.0)
Platelets: 401 10*3/uL — ABNORMAL HIGH (ref 150.0–400.0)
RBC: 4.59 Mil/uL (ref 3.87–5.11)
RDW: 16.1 % — ABNORMAL HIGH (ref 11.5–15.5)
WBC: 12.3 10*3/uL — ABNORMAL HIGH (ref 4.0–10.5)

## 2018-11-30 LAB — COMPREHENSIVE METABOLIC PANEL
ALT: 23 U/L (ref 0–35)
AST: 22 U/L (ref 0–37)
Albumin: 4.2 g/dL (ref 3.5–5.2)
Alkaline Phosphatase: 104 U/L (ref 39–117)
BUN: 13 mg/dL (ref 6–23)
CO2: 27 mEq/L (ref 19–32)
Calcium: 9.6 mg/dL (ref 8.4–10.5)
Chloride: 101 mEq/L (ref 96–112)
Creatinine, Ser: 0.64 mg/dL (ref 0.40–1.20)
GFR: 98.11 mL/min (ref >60.00)
Glucose, Bld: 166 mg/dL — ABNORMAL HIGH (ref 70–99)
Potassium: 4.1 mEq/L (ref 3.5–5.1)
Sodium: 137 mEq/L (ref 135–145)
Total Bilirubin: 0.4 mg/dL (ref 0.2–1.2)
Total Protein: 7.8 g/dL (ref 6.0–8.3)

## 2018-11-30 LAB — TSH: TSH: 31.67 u[IU]/mL — ABNORMAL HIGH (ref 0.35–4.50)

## 2018-11-30 LAB — HEMOGLOBIN A1C: Hgb A1c MFr Bld: 7.8 % — ABNORMAL HIGH (ref 4.6–6.5)

## 2018-11-30 MED ORDER — CARISOPRODOL 350 MG PO TABS: 350.0000 mg | ORAL_TABLET | Freq: Three times a day (TID) | ORAL | 0 refills | Status: DC | PRN

## 2018-11-30 MED ORDER — BUDESONIDE-FORMOTEROL FUMARATE 80-4.5 MCG/ACT IN AERO: 2.0000 | INHALATION_SPRAY | Freq: Two times a day (BID) | RESPIRATORY_TRACT | 3 refills | Status: DC

## 2018-11-30 NOTE — Assessment & Plan Note (Signed)
Check A1c.  Discussed monitoring her diet.  She was concerned about her albumin trending down and discussed that this is a marker of nutritional status.  We will recheck today.

## 2018-11-30 NOTE — Assessment & Plan Note (Signed)
Patient has continued to have some asthma symptoms despite use of Qvar and is using her albuterol consistently.  Discussed switching her to Symbicort and discontinuing Qvar.  She will follow-up in 2 months.  She appears to have recovered fairly well from influenza.  Discussed with clinical RN and based on current guidelines patient does not meet exposure or testing criteria for coronavirus with lack of travel and lack of exposure to a documented positive coronavirus tested patient.  She has had no recent sick contacts.  Discussed monitoring her symptoms.  Discussed not going to work if she develops another fever.

## 2018-11-30 NOTE — Patient Instructions (Signed)
Nice to see you. We will check lab work today and contact you with the results. We will change you from Qvar to Symbicort.  We will refer you to an allergist. Please do the exercises for your back.

## 2018-11-30 NOTE — Assessment & Plan Note (Signed)
Upper respiratory symptoms over the last 2 months likely related to allergic rhinitis.  She has been on maximal therapy and thus we will refer to an allergist.

## 2018-11-30 NOTE — Assessment & Plan Note (Signed)
Check CBC.  Previously with mildly elevated white blood cell count.

## 2018-11-30 NOTE — Assessment & Plan Note (Signed)
Check TSH 

## 2018-11-30 NOTE — Progress Notes (Signed)
Tina Rumps, MD Phone: [phone]  Tina Moreno is a 53 y.o. female who presents today for f/u.  Chronic back pain: Notes this is typically in her upper back.  She is doing fairly well right now with no significant pain.  She does have some exercises that she does occasionally from physical therapy.  She notes no numbness, weakness, radiation, or incontinence.  She rarely takes Manufacturing engineer.  It does relax her when she takes it though no excessive drowsiness.  No alcohol use with this.  Ibuprofen does help some.  She does report prior imaging of her neck.  Hypothyroidism: Notes her skin is not as dry as previously.  She does note chronic heat intolerance that is unchanged over the years.  No cold intolerance.  She takes Synthroid.  Asthma/allergic rhinitis: Patient was recently diagnosed with influenza.  She feels improved from this.  She has had a cough for about 2 months now that is dry in nature with some wheezing.  Some occasional shortness of breath with this.  The albuterol is beneficial and she uses this about once a day.  She was having trouble using her Qvar and notes it only helps some. She has had some sinus congestion and post nasal drip for about 2 months as well. Related to allergies though only partially controlled by allegra and flonase.  Singulair was not beneficial and gave her a headache so she discontinued it.  She had fevers with the flu and notes her highest temperature since being treated for the flu was 100 F this past Monday. No fevers since then.  She does note 1 of her coworkers traveled to [place] previously though they self quarantined for 14 days under the direction of her company and never developed any symptoms.  She has had no known exposure to anybody that tested positive for coronavirus.  The patient has not traveled.  Prediabetes: No polyuria or polydipsia.  She eats fairly healthfully when she cooks for herself though does eat some fast food.  Social History    Tobacco Use  Smoking Status Never Smoker  Smokeless Tobacco Never Used     ROS see history of present illness  Objective  Physical Exam Vitals:   11/30/18 0853  BP: 120/80  Pulse: 86  Temp: 98.1 F (36.7 C)  SpO2: 95%    BP Readings from Last 3 Encounters:  11/30/18 120/80  11/14/18 123/87  11/10/18 (!) 142/88   Wt Readings from Last 3 Encounters:  11/30/18 243 lb 6.4 oz (110.4 kg)  11/14/18 241 lb (109.3 kg)  11/10/18 239 lb (108.4 kg)    Physical Exam Constitutional:      General: She is not in acute distress.    Appearance: She is not diaphoretic.  HENT:     Head: Normocephalic and atraumatic.     Mouth/Throat:     Mouth: Mucous membranes are moist.     Pharynx: Oropharynx is clear.  Eyes:     Conjunctiva/sclera: Conjunctivae normal.     Pupils: Pupils are equal, round, and reactive to light.  Cardiovascular:     Rate and Rhythm: Normal rate and regular rhythm.     Heart sounds: Normal heart sounds.  Pulmonary:     Effort: Pulmonary effort is normal.     Breath sounds: Normal breath sounds.  Musculoskeletal:     Right lower leg: No edema.     Left lower leg: No edema.     Comments: No midline spine tenderness, no midline spine  step-off, no muscular back tenderness  Skin:    General: Skin is warm and dry.  Neurological:     Mental Status: She is alert.     Comments: 5/5 strength bilateral quads, hamstrings, plantar flexion, and dorsiflexion, sensation light touch intact bilateral lower extremities      Assessment/Plan: Please see individual problem list.  Allergic rhinitis Upper respiratory symptoms over the last 2 months likely related to allergic rhinitis.  She has been on maximal therapy and thus we will refer to an allergist.  Reactive airway disease Patient has continued to have some asthma symptoms despite use of Qvar and is using her albuterol consistently.  Discussed switching her to Symbicort and discontinuing Qvar.  She will  follow-up in 2 months.  She appears to have recovered fairly well from influenza.  Discussed with clinical RN and based on current guidelines patient does not meet exposure or testing criteria for coronavirus with lack of travel and lack of exposure to a documented positive coronavirus tested patient.  She has had no recent sick contacts.  Discussed monitoring her symptoms.  Discussed not going to work if she develops another fever.  Back pain Chronic intermittent issue.  No significant symptoms currently.  Discussed doing her exercises consistently.  Refill of Soma given.  She is not using this consistently and has not required a refill since 2017.  Discussed that she should not use this medication consistently.  Hypothyroid Check TSH.  Leukocytosis Check CBC.  Previously with mildly elevated white blood cell count.  Pre-diabetes Check A1c.  Discussed monitoring her diet.  She was concerned about her albumin trending down and discussed that this is a marker of nutritional status.  We will recheck today.   Orders Placed This Encounter  Procedures   TSH   HgB A1c   Comp Met (CMET)   CBC w/Diff   Ambulatory referral to Allergy    Referral Priority:   Routine    Referral Type:   Allergy Testing    Referral Reason:   Specialty Services Required    Requested Specialty:   Allergy    Number of Visits Requested:   1    Meds ordered this encounter  Medications   budesonide-formoterol (SYMBICORT) 80-4.5 MCG/ACT inhaler    Sig: Inhale 2 puffs into the lungs 2 (two) times daily.    Dispense:  1 Inhaler    Refill:  3   carisoprodol (SOMA) 350 MG tablet    Sig: Take 1 tablet (350 mg total) by mouth 3 (three) times daily as needed.    Dispense:  30 tablet    Refill:  0     Tina Rumps, MD [place]

## 2018-11-30 NOTE — Assessment & Plan Note (Signed)
Chronic intermittent issue.  No significant symptoms currently.  Discussed doing her exercises consistently.  Refill of Soma given.  She is not using this consistently and has not required a refill since 2017.  Discussed that she should not use this medication consistently.

## 2018-12-05 ENCOUNTER — Encounter: Payer: Self-pay | Admitting: Family Medicine

## 2018-12-13 ENCOUNTER — Other Ambulatory Visit: Payer: Self-pay | Admitting: Family Medicine

## 2018-12-13 DIAGNOSIS — D72829 Elevated white blood cell count, unspecified: Secondary | ICD-10-CM

## 2018-12-13 DIAGNOSIS — E039 Hypothyroidism, unspecified: Secondary | ICD-10-CM

## 2018-12-13 MED ORDER — METFORMIN HCL 500 MG PO TABS: 500.0000 mg | ORAL_TABLET | Freq: Two times a day (BID) | ORAL | 3 refills | Status: DC

## 2019-01-02 ENCOUNTER — Ambulatory Visit: Payer: BLUE CROSS/BLUE SHIELD | Admitting: Allergy

## 2019-01-02 ENCOUNTER — Other Ambulatory Visit: Payer: Self-pay

## 2019-01-02 ENCOUNTER — Encounter: Payer: Self-pay | Admitting: Allergy

## 2019-01-02 VITALS — BP 118/94 | HR 100 | Temp 98.5°F | Resp 18 | Ht 63.0 in | Wt 244.0 lb

## 2019-01-02 DIAGNOSIS — R6889 Other general symptoms and signs: Secondary | ICD-10-CM | POA: Diagnosis not present

## 2019-01-02 DIAGNOSIS — R05 Cough: Secondary | ICD-10-CM

## 2019-01-02 DIAGNOSIS — J310 Chronic rhinitis: Secondary | ICD-10-CM | POA: Diagnosis not present

## 2019-01-02 DIAGNOSIS — R059 Cough, unspecified: Secondary | ICD-10-CM | POA: Insufficient documentation

## 2019-01-02 MED ORDER — FLUTICASONE FUROATE-VILANTEROL 100-25 MCG/INH IN AEPB: 1.0000 | INHALATION_SPRAY | Freq: Every day | RESPIRATORY_TRACT | 3 refills | Status: DC

## 2019-01-02 NOTE — Assessment & Plan Note (Signed)
   Negative skin testing to environmental allergies.   She has history of dry eyes and will not prescribe any antihistamine eye drops as it can worsen the dry eyes.  May use OTC rewetting eye drops.  No recent eye exam and recommend to obtain one within this year.

## 2019-01-02 NOTE — Assessment & Plan Note (Signed)
Coughing with post tussive emesis at times, wheezing, SOB and chest tightness for 4-5 years. Main triggers are: allergies?, infections, weather changes, exertion. Using albuterol a few times per week. Now on Qvar 80 1 puff BID x 1 year with some benefit. ? If Symbicort caused headaches. . Today's spirometry showed: moderate restriction with 6% improvement in FEV1 post bronchodilator treatment. . Given clinical history will do a 2 month trial of Breo.  . Daily controller medication(s): Start Breo 100 1 puff once a day and rinse mouth afterwards.  Demonstrated proper use and sample was given. o Stop Qvar for now.  . Prior to physical activity: May use albuterol rescue inhaler 2 puffs 5 to 15 minutes prior to strenuous physical activities. Marland Kitchen Rescue medications: May use albuterol rescue inhaler 2 puffs or nebulizer every 4 to 6 hours as needed for shortness of breath, chest tightness, coughing, and wheezing. Monitor frequency of use.  . During upper respiratory infections: ADD Qvar 80 2 puffs twice a day and rinse mouth afterwards for 1-2 weeks.

## 2019-01-02 NOTE — Patient Instructions (Addendum)
Today's skin testing showed: Negative to environmental allergies   May use OTC rewetting eye drops. Get eyes checked in future. Start Flonase 1-2 sprays once a day.  Nasal saline spray (i.e., Simply Saline) or nasal saline lavage (i.e., NeilMed) is recommended as needed and prior to medicated nasal sprays.  Continue to take nexium.   Asthma: . Daily controller medication(s): Start Breo 100 1 puff once a day and rinse mouth afterwards.  o Stop Qvar for now.  . Prior to physical activity: May use albuterol rescue inhaler 2 puffs 5 to 15 minutes prior to strenuous physical activities. Marland Kitchen Rescue medications: May use albuterol rescue inhaler 2 puffs or nebulizer every 4 to 6 hours as needed for shortness of breath, chest tightness, coughing, and wheezing. Monitor frequency of use.  . During upper respiratory infections: ADD Qvar 80 2 puffs twice a day and rinse mouth afterwards for 1-2 weeks. . Asthma control goals:  o Full participation in all desired activities (may need albuterol before activity) o Albuterol use two times or less a week on average (not counting use with activity) o Cough interfering with sleep two times or less a month o Oral steroids no more than once a year o No hospitalizations  Follow up in 2 months  Skin care recommendations  Bath time: . Always use lukewarm water. AVOID very hot or cold water. Marland Kitchen Keep bathing time to 5-10 minutes. . Do NOT use bubble bath. . Use a mild soap and use just enough to wash the dirty areas. . Do NOT scrub skin vigorously.  . After bathing, pat dry your skin with a towel. Do NOT rub or scrub the skin.  Moisturizers and prescriptions:  . ALWAYS apply moisturizers immediately after bathing (within 3 minutes). This helps to lock-in moisture. . Use the moisturizer several times a day over the whole body. Kermit Balo summer moisturizers include: Aveeno, CeraVe, Cetaphil. Kermit Balo winter moisturizers include: Aquaphor, Vaseline, Cerave,  Cetaphil, Eucerin, Vanicream. . When using moisturizers along with medications, the moisturizer should be applied about one hour after applying the medication to prevent diluting effect of the medication or moisturize around where you applied the medications. When not using medications, the moisturizer can be continued twice daily as maintenance.  Laundry and clothing: . Avoid laundry products with added color or perfumes. . Use unscented hypo-allergenic laundry products such as Tide free, Cheer free & gentle, and All free and clear.  . If the skin still seems dry or sensitive, you can try double-rinsing the clothes. . Avoid tight or scratchy clothing such as wool. . Do not use fabric softeners or dyer sheets.

## 2019-01-02 NOTE — Assessment & Plan Note (Signed)
Postnasal drip for the past 4 to 5 years mainly in the spring and fall.  Tried Allegra and Flonase with some benefit.  History of reflux and currently on Nexium.  Today skin testing was negative to environmental allergens.  She most likely has non-allergic rhinitis.  Start Flonase 1-2 sprays once a day.   Nasal saline spray (i.e., Simply Saline) or nasal saline lavage (i.e., NeilMed) is recommended as needed and prior to medicated nasal sprays.  Continue to take Nexium.   If not improved then will refer to ENT.

## 2019-01-02 NOTE — Progress Notes (Signed)
New Patient Note  RE: Tina Moreno MRN: [id number] DOB: November 13, 1965 Date of Office Visit: 01/02/2019  Referring provider: Leone Haven, MD Primary care provider: Leone Haven, MD  Chief Complaint: Asthma and Allergic Rhinitis  (making asthma flare )  History of Present Illness: I had the pleasure of seeing Tina Moreno for initial evaluation at the [facility] on 01/02/2019. She is a 53 y.o. female, who is referred here by Leone Haven, MD for the evaluation of asthma and allergic rhinitis.   Asthma: She reports symptoms of chest tightness, shortness of breath, coughing with post tussive emesis, wheezing for 4-5 years. Current medications include Qvar 80 1 puff BID x 1 year and albuterol prn which help. She reports not using aerochamber with asthma inhalers. She tried the following inhalers: Symbicort caused headaches. Main asthma triggers are allergies, infections, weather changes, exercise. In the last month, frequency of asthma symptoms: few times/week. Frequency of nocturnal symptoms: 0x/month. Frequency of SABA use: few timesx/week. Interference with physical activity: yes sometimes. Sleep is undisturbed. In the last 12 months, emergency room visits/urgent care visits/doctor office visits or hospitalizations due to asthma: one. In the last 12 months, oral steroids courses: one. Lifetime history of hospitalization for asthma: no. Prior intubations: no. Asthma was diagnosed at age 59s. History of pneumonia: no. She was not evaluated by allergist/pulmonologist in the past. Smoking exposure: no. Up to date with flu vaccine: no.  Rhinitis: She reports symptoms of PND, coughing, itchy eyes. Symptoms have been going on for 4-5 years. The symptoms are present usually from spring and fall. Other triggers include exposure to certain grasses. Anosmia: no. Headache: sometimes. She has used Human resources officer, Flonase with some improvement in symptoms. Sinus infections: no.  Previous work up includes: none. No recent eye exam and has dry eyes.  Patient has history of reflux and taking Nexium now.   Assessment and Plan: Tina Moreno is a 53 y.o. female with: Coughing Coughing with post tussive emesis at times, wheezing, SOB and chest tightness for 4-5 years. Main triggers are: allergies?, infections, weather changes, exertion. Using albuterol a few times per week. Now on Qvar 80 1 puff BID x 1 year with some benefit. ? If Symbicort caused headaches. . Today's spirometry showed: moderate restriction with 6% improvement in FEV1 post bronchodilator treatment. . Given clinical history will do a 2 month trial of Breo.  . Daily controller medication(s): Start Breo 100 1 puff once a day and rinse mouth afterwards.  Demonstrated proper use and sample was given. o Stop Qvar for now.  . Prior to physical activity: May use albuterol rescue inhaler 2 puffs 5 to 15 minutes prior to strenuous physical activities. Marland Kitchen Rescue medications: May use albuterol rescue inhaler 2 puffs or nebulizer every 4 to 6 hours as needed for shortness of breath, chest tightness, coughing, and wheezing. Monitor frequency of use.  . During upper respiratory infections: ADD Qvar 80 2 puffs twice a day and rinse mouth afterwards for 1-2 weeks.  Chronic rhinitis Postnasal drip for the past 4 to 5 years mainly in the spring and fall.  Tried Allegra and Flonase with some benefit.  History of reflux and currently on Nexium.  Today skin testing was negative to environmental allergens.  She most likely has non-allergic rhinitis.  Start Flonase 1-2 sprays once a day.   Nasal saline spray (i.e., Simply Saline) or nasal saline lavage (i.e., NeilMed) is recommended as needed and prior to medicated nasal sprays.  Continue to take Nexium.   If not improved then will refer to ENT.   Itchy eyes  Negative skin testing to environmental allergies.   She has history of dry eyes and will not prescribe any  antihistamine eye drops as it can worsen the dry eyes.  May use OTC rewetting eye drops.  No recent eye exam and recommend to obtain one within this year.   Return in about 2 months (around 03/04/2019).  Meds ordered this encounter  Medications  . fluticasone furoate-vilanterol (BREO ELLIPTA) 100-25 MCG/INH AEPB    Sig: Inhale 1 puff into the lungs daily.    Dispense:  30 each    Refill:  3   Other allergy screening: Food allergy: yes Medication allergy: yes  Amoxicillin - not sure, ? Headaches, dizziness.  Hymenoptera allergy: no Urticaria: not recently Eczema:no History of recurrent infections suggestive of immunodeficency: no  Diagnostics: Spirometry:  Tracings reviewed. Her effort: Good reproducible efforts. FVC: 1.86L FEV1: 1.51L, 56% predicted FEV1/FVC ratio: 81% Interpretation: moderate restriction with 6% improvement in FEV1 post bronchodilator treatment.  Please see scanned spirometry results for details.  Skin Testing: Environmental allergy panel. Negative test to: environmental allergies.  Results discussed with patient/family. Airborne Adult Perc - 01/02/19 0900    Time Antigen Placed  0900    Allergen Manufacturer  Lavella Hammock    Location  Back    Number of Test  59    Panel 1  Select    1. Control-Buffer 50% Glycerol  Negative    2. Control-Histamine 1 mg/ml  4+    3. Albumin saline  Negative    4. Sutherland  Negative    5. Guatemala  Negative    6. Johnson  Negative    7. Adelino Blue  Negative    8. Meadow Fescue  Negative    9. Perennial Rye  Negative    10. Sweet Vernal  Negative    11. Timothy  Negative    12. Cocklebur  Negative    13. Burweed Marshelder  Negative    14. Ragweed, short  Negative    15. Ragweed, Giant  Negative    16. Plantain,  English  Negative    17. Lamb's Quarters  Negative    18. Sheep Sorrell  Negative    19. Rough Pigweed  Negative    20. Marsh Elder, Rough  Negative    21. Mugwort, Common  Negative    22. Ash mix   Negative    23. Birch mix  Negative    24. Beech American  Negative    25. Box, Elder  Negative    26. Cedar, red  Negative    27. Cottonwood, [place]  Negative    28. Elm mix  Negative    29. Hickory mix  Negative    30. Maple mix  Negative    31. Oak, [place] mix  Negative    32. Pecan Pollen  Negative    33. Pine mix  Negative    34. Sycamore Eastern  Negative    35. Glenwood, Black Pollen  Negative    36. Alternaria alternata  Negative    37. Cladosporium Herbarum  Negative    38. Aspergillus mix  Negative    39. Penicillium mix  Negative    40. Bipolaris sorokiniana (Helminthosporium)  Negative    41. Drechslera spicifera (Curvularia)  Negative    42. Mucor plumbeus  Negative    43. Fusarium moniliforme  Negative  44. Aureobasidium pullulans (pullulara)  Negative    45. Rhizopus oryzae  Negative    46. Botrytis cinera  Negative    47. Epicoccum nigrum  Negative    48. Phoma betae  Negative    49. Candida Albicans  Negative    50. Trichophyton mentagrophytes  Negative    51. Mite, D Farinae  5,000 AU/ml  Negative    52. Mite, D Pteronyssinus  5,000 AU/ml  Negative    53. Cat Hair 10,000 BAU/ml  Negative    54.  Dog Epithelia  Negative    55. Mixed Feathers  Negative    56. Horse Epithelia  Negative    57. Cockroach, German  Negative    58. Mouse  Negative    59. Tobacco Leaf  Negative     Intradermal - 01/02/19 0947    Time Antigen Placed  6606    Allergen Manufacturer  Lavella Hammock    Location  Arm    Number of Test  15    Intradermal  Select    Control  Negative    Guatemala  Negative    Johnson  Negative    7 Grass  Negative    Ragweed mix  Negative    Weed mix  Negative    Tree mix  Negative    Mold 1  Negative    Mold 2  Negative    Mold 3  Negative    Mold 4  Negative    Cat  Negative    Dog  Negative    Cockroach  Negative    Mite mix  Negative       Past Medical History: Patient Active Problem List   Diagnosis Date Noted  . Chronic rhinitis  01/02/2019  . Coughing 01/02/2019  . Itchy eyes 01/02/2019  . Leukocytosis 11/30/2018  . Leg swelling 11/14/2018  . Palpitations 08/26/2018  . DOE (dyspnea on exertion) 08/26/2018  . Fall 02/21/2018  . Left wrist pain 04/12/2017  . Anxiety and depression 10/06/2016  . Headache 07/06/2016  . Allergic rhinitis 07/06/2016  . Reactive airway disease 03/30/2016  . Diabetes ([place]) 12/13/2015  . Vitamin D deficiency 12/13/2015  . Elevated LDL cholesterol level 12/13/2015  . Back pain   . Hypothyroid 01/11/2012  . Morbid obesity ([place]) 01/11/2012   Past Medical History:  Diagnosis Date  . Abnormal Pap smear of cervix   . Anxiety   . Back pain   . Cervical dysplasia   . Depression   . Elevated WBCs    NEGATIVE HEM W/U  . GERD (gastroesophageal reflux disease)   . Hypothyroid    Past Surgical History: Past Surgical History:  Procedure Laterality Date  . COLPOSCOPY    . GYNECOLOGIC CRYOSURGERY     Medication List:  Current Outpatient Medications  Medication Sig Dispense Refill  . albuterol (PROVENTIL HFA;VENTOLIN HFA) 108 (90 Base) MCG/ACT inhaler Inhale 2 puffs into the lungs every 6 (six) hours as needed for wheezing or shortness of breath. 1 Inhaler 2  . ALPRAZolam (XANAX) 0.5 MG tablet Take 0.5 mg by mouth as needed.      . beclomethasone (QVAR) 80 MCG/ACT inhaler Inhale into the lungs 2 (two) times daily.    . carisoprodol (SOMA) 350 MG tablet Take 1 tablet (350 mg total) by mouth 3 (three) times daily as needed. 30 tablet 0  . ibuprofen (ADVIL,MOTRIN) 200 MG tablet Take 200 mg by mouth every 6 (six) hours as needed.      Marland Kitchen  levothyroxine (SYNTHROID, LEVOTHROID) 175 MCG tablet Take 1 tablet (175 mcg total) by mouth daily before breakfast. 90 tablet 1  . metFORMIN (GLUCOPHAGE) 500 MG tablet Take 1 tablet (500 mg total) by mouth 2 (two) times daily with a meal. 180 tablet 3  . budesonide-formoterol (SYMBICORT) 80-4.5 MCG/ACT inhaler Inhale 2 puffs into the lungs 2 (two) times  daily. (Patient not taking: Reported on 01/02/2019) 1 Inhaler 3  . fluticasone (FLONASE) 50 MCG/ACT nasal spray Place 2 sprays into both nostrils daily as needed for allergies or rhinitis. 16 g 11  . fluticasone furoate-vilanterol (BREO ELLIPTA) 100-25 MCG/INH AEPB Inhale 1 puff into the lungs daily. 30 each 3  . montelukast (SINGULAIR) 10 MG tablet Take 1 tablet (10 mg total) by mouth at bedtime. For allergies (Patient not taking: Reported on 01/02/2019) 30 tablet 0  . norelgestromin-ethinyl estradiol Marilu Favre) 150-35 MCG/24HR transdermal patch apply 1 patch and replace weekly for 3 weeks (Patient not taking: Reported on 01/02/2019) 3 Package 4  . nystatin (MYCOSTATIN) 100000 UNIT/ML suspension Take 5 mLs (500,000 Units total) by mouth 4 (four) times daily. X 7-10 days (Patient not taking: Reported on 01/02/2019) 473 mL 0  . Vitamin D, Ergocalciferol, (DRISDOL) 50000 units CAPS capsule Take 1 capsule (50,000 Units total) by mouth every 7 (seven) days. (Patient not taking: Reported on 01/02/2019) 30 capsule 6   No current facility-administered medications for this visit.    Allergies: Allergies  Allergen Reactions  . Amoxicillin Other (See Comments)    Dizziness  . Other     TREES   Social History: Social History   Socioeconomic History  . Marital status: Single    Spouse name: Not on file  . Number of children: Not on file  . Years of education: Not on file  . Highest education level: Not on file  Occupational History  . Not on file  Social Needs  . Financial resource strain: Not on file  . Food insecurity:    Worry: Not on file    Inability: Not on file  . Transportation needs:    Medical: Not on file    Non-medical: Not on file  Tobacco Use  . Smoking status: Never Smoker  . Smokeless tobacco: Never Used  Substance and Sexual Activity  . Alcohol use: Yes    Comment: OCCASSIONALLY  . Drug use: No  . Sexual activity: Not Currently  Lifestyle  . Physical activity:    Days per  week: Not on file    Minutes per session: Not on file  . Stress: Not on file  Relationships  . Social connections:    Talks on phone: Not on file    Gets together: Not on file    Attends religious service: Not on file    Active member of club or organization: Not on file    Attends meetings of clubs or organizations: Not on file    Relationship status: Not on file  Other Topics Concern  . Not on file  Social History Narrative  . Not on file   Lives in a house built in [place]. Smoking: denies Occupation: Sport and exercise psychologist HistoryFreight forwarder in the house: no Charity fundraiser in the family room: no Carpet in the bedroom: no Heating: gas Cooling: central Pet: yes 1 dog x 3 yrs, 1 cat x 3 yrs  Family History: Family History  Problem Relation Age of Onset  . Parkinsonism Father   . Diabetes Father   . Hypertension Father   .  Cancer Maternal Aunt        SOFT CELL SARCOMA  . Cancer Maternal Uncle        TESTICULAR  . Cancer Paternal Grandmother        COLON  . Cancer Son        TESTICULAR   Problem                               Relation Asthma                                   Son Eczema                                No  Food allergy                          No  Allergic rhino conjunctivitis     Son  Review of Systems  Constitutional: Negative for appetite change, chills, fever and unexpected weight change.  HENT: Positive for postnasal drip. Negative for congestion and rhinorrhea.   Eyes: Positive for itching.  Respiratory: Positive for cough. Negative for chest tightness, shortness of breath and wheezing.   Cardiovascular: Negative for chest pain.  Gastrointestinal: Negative for abdominal pain.  Genitourinary: Negative for difficulty urinating.  Skin: Negative for rash.  Allergic/Immunologic: Negative for environmental allergies and food allergies.  Neurological: Positive for headaches.   Objective: BP (!) 118/94 (BP Location: Left Arm, Patient Position:  Sitting, Cuff Size: Large)   Pulse 100   Temp 98.5 F (36.9 C) (Oral)   Resp 18   Ht 5\' 3"  (1.6 m)   Wt 244 lb (110.7 kg)   SpO2 96%   BMI 43.22 kg/m  Body mass index is 43.22 kg/m. Physical Exam  Constitutional: She is oriented to person, place, and time. She appears well-developed and well-nourished.  HENT:  Head: Normocephalic and atraumatic.  Right Ear: External ear normal.  Left Ear: External ear normal.  Nose: Nose normal.  Mouth/Throat: Oropharynx is clear and moist.  Eyes: Conjunctivae and EOM are normal.  Neck: Neck supple.  Cardiovascular: Normal rate, regular rhythm and normal heart sounds. Exam reveals no gallop and no friction rub.  No murmur heard. Pulmonary/Chest: Effort normal and breath sounds normal. She has no wheezes. She has no rales.  Abdominal: Soft.  Neurological: She is alert and oriented to person, place, and time.  Skin: Skin is warm and dry. No rash noted.  Dry skin on the face  Psychiatric: She has a normal mood and affect. Her behavior is normal.  Nursing note and vitals reviewed.  The plan was reviewed with the patient/family, and all questions/concerned were addressed.  It was my pleasure to see Alya today and participate in her care. Please feel free to contact me with any questions or concerns.  Sincerely,  Rexene Alberts, DO Allergy & Immunology  [facility] office: [phone] [facility] office: [phone]

## 2019-01-12 ENCOUNTER — Other Ambulatory Visit (INDEPENDENT_AMBULATORY_CARE_PROVIDER_SITE_OTHER): Payer: BLUE CROSS/BLUE SHIELD

## 2019-01-12 ENCOUNTER — Other Ambulatory Visit: Payer: Self-pay

## 2019-01-12 DIAGNOSIS — D72829 Elevated white blood cell count, unspecified: Secondary | ICD-10-CM

## 2019-01-12 DIAGNOSIS — E039 Hypothyroidism, unspecified: Secondary | ICD-10-CM

## 2019-01-12 LAB — CBC WITH DIFFERENTIAL/PLATELET
Basophils Absolute: 0.1 10*3/uL (ref 0.0–0.1)
Basophils Relative: 0.9 % (ref 0.0–3.0)
Eosinophils Absolute: 0.2 10*3/uL (ref 0.0–0.7)
Eosinophils Relative: 2.2 % (ref 0.0–5.0)
HCT: 38.8 % (ref 36.0–46.0)
Hemoglobin: 12.7 g/dL (ref 12.0–15.0)
Lymphocytes Relative: 32.1 % (ref 12.0–46.0)
Lymphs Abs: 3.6 10*3/uL (ref 0.7–4.0)
MCHC: 32.8 g/dL (ref 30.0–36.0)
MCV: 85.6 fl (ref 78.0–100.0)
Monocytes Absolute: 0.6 10*3/uL (ref 0.1–1.0)
Monocytes Relative: 5 % (ref 3.0–12.0)
Neutro Abs: 6.6 10*3/uL (ref 1.4–7.7)
Neutrophils Relative %: 59.8 % (ref 43.0–77.0)
Platelets: 411 10*3/uL — ABNORMAL HIGH (ref 150.0–400.0)
RBC: 4.53 Mil/uL (ref 3.87–5.11)
RDW: 15.5 % (ref 11.5–15.5)
WBC: 11.1 10*3/uL — ABNORMAL HIGH (ref 4.0–10.5)

## 2019-01-12 LAB — TSH: TSH: 12.88 u[IU]/mL — ABNORMAL HIGH (ref 0.35–4.50)

## 2019-01-12 NOTE — Addendum Note (Signed)
Addended by: Arby Barrette on: 01/12/2019 08:43 AM   Modules accepted: Orders

## 2019-01-14 ENCOUNTER — Other Ambulatory Visit: Payer: Self-pay | Admitting: Family Medicine

## 2019-01-14 DIAGNOSIS — E039 Hypothyroidism, unspecified: Secondary | ICD-10-CM

## 2019-01-14 MED ORDER — LEVOTHYROXINE SODIUM 200 MCG PO TABS: 200.0000 ug | ORAL_TABLET | Freq: Every day | ORAL | 1 refills | Status: DC

## 2019-01-16 ENCOUNTER — Other Ambulatory Visit: Payer: Self-pay | Admitting: Family

## 2019-01-16 DIAGNOSIS — D72819 Decreased white blood cell count, unspecified: Secondary | ICD-10-CM

## 2019-01-24 ENCOUNTER — Other Ambulatory Visit: Payer: Self-pay

## 2019-01-25 ENCOUNTER — Inpatient Hospital Stay: Payer: BLUE CROSS/BLUE SHIELD | Attending: Internal Medicine | Admitting: Internal Medicine

## 2019-01-25 DIAGNOSIS — D473 Essential (hemorrhagic) thrombocythemia: Secondary | ICD-10-CM

## 2019-01-25 DIAGNOSIS — D729 Disorder of white blood cells, unspecified: Secondary | ICD-10-CM | POA: Diagnosis not present

## 2019-01-25 DIAGNOSIS — D75839 Thrombocytosis, unspecified: Secondary | ICD-10-CM | POA: Insufficient documentation

## 2019-01-25 NOTE — Progress Notes (Signed)
I connected with Burnetta Kohls Sleight on 01/25/2019 at 10:00 AM EDT by video enabled telemedicine visit and verified that I am speaking with the correct person using two identifiers.  I discussed the limitations, risks, security and privacy concerns of performing an evaluation and management service by telemedicine and the availability of in-person appointments. I also discussed with the patient that there may be a patient responsible charge related to this service. The patient expressed understanding and agreed to proceed.    Other persons participating in the visit and their role in the encounter: None Patient's location: Home Provider's location: Home   No history exists.     Chief Complaint: Leukocytosis   History of present illness:Tina Moreno 53 y.o.  female with history of chronic mild leukocytosis/thrombocytosis has been referred to [place] for further evaluation.  Patient states that she was referred to a hematologist in [place] 10 to 15 years ago for similar problem of mild leukocytosis/thrombocytosis.  As per patient she had work-up; however did not need a bone marrow biopsy.  She was under surveillance for few year.  Patient is chronic night sweats; otherwise no weight loss.  No recent use of steroids or any other medications.  No history of blood clots or strokes.  Complains of mild to moderate fatigue.  Observation/objective: White count 11; hemoglobin normal platelets slightly up at 411  Assessment and plan: Neutrophilia #Chronic mild leukocytosis [since 2013] white count 11; hemoglobin normal platelets slightly elevated at 411.  Likely reactive/baseline for patient.  Given lack of any concerns for primary myeloid disorder I would not recommend any further work-up like bone marrow biopsy at this time.  #And long discussion the patient regarding the slightly elevated white count is a chronic problem/and at this time does not warrant any further work-up.  However would be happy  to reevaluate the patient if the white count is beyond 20/other severe hematologic abnormalities are noted.  #Hypothyroidism-on Synthroid as per PCP.  #Disposition: No follow-up with [place] as recommended/continue follow-up with PCP  Thank you Dr.Sonnenberg for allowing me to participate in the care of your pleasant patient. Please do not hesitate to contact me with questions or concerns in the interim.   Follow-up instructions:  I discussed the assessment and treatment plan with the patient.  The patient was provided an opportunity to ask questions and all were answered.  The patient agreed with the plan and demonstrated understanding of instructions.  The patient was advised to call back or seek an in person evaluation if the symptoms worsen or if the condition fails to improve as anticipated.   Dr. Charlaine Dalton Braman at [facility] 02/10/2019 9:06 AM

## 2019-02-03 ENCOUNTER — Ambulatory Visit: Payer: BLUE CROSS/BLUE SHIELD | Admitting: Family Medicine

## 2019-02-10 NOTE — Assessment & Plan Note (Addendum)
#  Chronic mild leukocytosis [since 2013] white count 11; hemoglobin normal platelets slightly elevated at 411.  Likely reactive/baseline for patient.  Given lack of any concerns for primary myeloid disorder I would not recommend any further work-up like bone marrow biopsy at this time.  #And long discussion the patient regarding the slightly elevated white count is a chronic problem/and at this time does not warrant any further work-up.  However would be happy to reevaluate the patient if the white count is beyond 20/other severe hematologic abnormalities are noted.  #Hypothyroidism-on Synthroid as per PCP.  #Disposition: No follow-up with [place] as recommended/continue follow-up with PCP  Thank you Dr.Sonnenberg for allowing me to participate in the care of your pleasant patient. Please do not hesitate to contact me with questions or concerns in the interim.

## 2019-02-24 ENCOUNTER — Other Ambulatory Visit: Payer: Self-pay | Admitting: Family Medicine

## 2019-03-03 ENCOUNTER — Other Ambulatory Visit: Payer: BLUE CROSS/BLUE SHIELD

## 2019-03-08 ENCOUNTER — Ambulatory Visit: Payer: BLUE CROSS/BLUE SHIELD | Admitting: Allergy

## 2019-03-08 ENCOUNTER — Other Ambulatory Visit: Payer: Self-pay

## 2019-03-08 ENCOUNTER — Encounter: Payer: Self-pay | Admitting: Allergy

## 2019-03-08 ENCOUNTER — Ambulatory Visit (INDEPENDENT_AMBULATORY_CARE_PROVIDER_SITE_OTHER): Payer: BC Managed Care – PPO | Admitting: Allergy

## 2019-03-08 DIAGNOSIS — R6889 Other general symptoms and signs: Secondary | ICD-10-CM

## 2019-03-08 DIAGNOSIS — R05 Cough: Secondary | ICD-10-CM

## 2019-03-08 DIAGNOSIS — R059 Cough, unspecified: Secondary | ICD-10-CM

## 2019-03-08 DIAGNOSIS — J310 Chronic rhinitis: Secondary | ICD-10-CM

## 2019-03-08 NOTE — Assessment & Plan Note (Signed)
Past history - history of dry eyes and will not prescribe any antihistamine eye drops as it can worsen the dry eyes. Interim history - unchanged symptoms.   May use OTC rewetting eye drops.  No recent eye exam and recommend to obtain one within this year.

## 2019-03-08 NOTE — Assessment & Plan Note (Signed)
Past history - Postnasal drip for the past 4 to 5 years mainly in the spring and fall.  Tried Allegra and Flonase with some benefit.  History of reflux and currently on Nexium. 2020 skin testing was negative to environmental allergens. Interim history - Better with Flonase.   Continue Flonase 1-2 sprays once a day.   Nasal saline spray (i.e., Simply Saline) or nasal saline lavage (i.e., NeilMed) is recommended as needed and prior to medicated nasal sprays.  Continue to take Nexium.   If not improved then will refer to ENT and consider bloodwork for zone 2 as in a small subset of patients skin testing can be negative but bloodwork is positive.

## 2019-03-08 NOTE — Progress Notes (Signed)
RE: Tina Moreno MRN: [id number] DOB: 16-Jul-1966 Date of Telemedicine Visit: 03/08/2019  Referring provider: Leone Haven, MD Primary care provider: Leone Haven, MD  Chief Complaint: Asthma   Telemedicine Follow Up Visit via Telephone: I connected with Laelle Bridgett for a follow up on 03/08/19 by telephone and verified that I am speaking with the correct person using two identifiers.   I discussed the limitations, risks, security and privacy concerns of performing an evaluation and management service by telephone and the availability of in person appointments. I also discussed with the patient that there may be a patient responsible charge related to this service. The patient expressed understanding and agreed to proceed.  Patient is at home. Provider is at the office.  Visit start time: 2:29PM Visit end time: 2:45PM Insurance consent/check in by: front desk Medical consent and medical assistant/nurse: Carlis Abbott.  History of Present Illness: She is a 53 y.o. female, who is being followed for coughing, nonallergic rhinitis, itchy eyes. Her previous allergy office visit was on 01/02/2019 with Dr. Maudie Mercury. Today is a regular follow up visit.  Coughing Patient tried Breo 100 1 puff daily for 1 month and stopped about a few weeks with no worsening symptoms. No rescue inhaler use since the last visit. The coughing has resolved and her breathing is feeling much better overall.   Chronic rhinitis Currently on Flonase 1 spray once a day with good benefit. No nosebleeds. Taking reflux medication Nexium as prescribed.   Itchy eyes Some itchy/dry eyes off and on since last visit. No eye appointment yet.   Assessment and Plan: Marly is a 53 y.o. female with: Coughing Past history - Coughing with post tussive emesis at times, wheezing, SOB and chest tightness for 4-5 years. Main triggers are: allergies?, infections, weather changes, exertion. Using albuterol a few times per  week. Now on Qvar 80 1 puff BID x 1 year with some benefit. ? If Symbicort caused headaches. 2020 spirometry showed: moderate restriction with 6% improvement in FEV1 post bronchodilator treatment. Interim history - coughing resolved and stopped Breo a few weeks ago with no worsening symptoms. Usually flares during the fall and spring.  Daily controller medication(s): ? If coughing starts up again then restart Breo 100 1 puff once a day and rinse mouth afterwards and let us know.   Prior to physical activity:May use albuterol rescue inhaler 2 puffs 5 to 15 minutes prior to strenuous physical activities.  Rescue medications:May use albuterol rescue inhaler 2 puffs or nebulizer every 4 to 6 hours as needed for shortness of breath, chest tightness, coughing, and wheezing. Monitor frequency of use.   During upper respiratory infections: ADD Qvar 80 2 puffs twice a day and rinse mouth afterwards for 1-2 weeks.  Chronic rhinitis Past history - Postnasal drip for the past 4 to 5 years mainly in the spring and fall.  Tried Allegra and Flonase with some benefit.  History of reflux and currently on Nexium. 2020 skin testing was negative to environmental allergens. Interim history - Better with Flonase.   Continue Flonase 1-2 sprays once a day.   Nasal saline spray (i.e., Simply Saline) or nasal saline lavage (i.e., NeilMed) is recommended as needed and prior to medicated nasal sprays.  Continue to take Nexium.   If not improved then will refer to ENT and consider bloodwork for zone 2 as in a small subset of patients skin testing can be negative but bloodwork is positive.   Itchy eyes Past history -  history of dry eyes and will not prescribe any antihistamine eye drops as it can worsen the dry eyes. Interim history - unchanged symptoms.   May use OTC rewetting eye drops.  No recent eye exam and recommend to obtain one within this year.   Return in about 5 months (around 08/08/2019).   Diagnostics: None.  Medication List:  Current Outpatient Medications  Medication Sig Dispense Refill  . albuterol (PROVENTIL HFA;VENTOLIN HFA) 108 (90 Base) MCG/ACT inhaler Inhale 2 puffs into the lungs every 6 (six) hours as needed for wheezing or shortness of breath. 1 Inhaler 2  . ALPRAZolam (XANAX) 0.5 MG tablet Take 0.5 mg by mouth as needed.      . carisoprodol (SOMA) 350 MG tablet Take 1 tablet (350 mg total) by mouth 3 (three) times daily as needed. 30 tablet 0  . fluticasone (FLONASE) 50 MCG/ACT nasal spray Place 2 sprays into both nostrils daily as needed for allergies or rhinitis. 16 g 11  . fluticasone furoate-vilanterol (BREO ELLIPTA) 100-25 MCG/INH AEPB Inhale 1 puff into the lungs daily. 30 each 3  . ibuprofen (ADVIL,MOTRIN) 200 MG tablet Take 200 mg by mouth every 6 (six) hours as needed.      Marland Kitchen levothyroxine (SYNTHROID) 200 MCG tablet Take 1 tablet (200 mcg total) by mouth daily before breakfast. 90 tablet 1  . metFORMIN (GLUCOPHAGE) 500 MG tablet Take 1 tablet (500 mg total) by mouth 2 (two) times daily with a meal. 180 tablet 3  . beclomethasone (QVAR) 80 MCG/ACT inhaler Inhale into the lungs 2 (two) times daily.    . montelukast (SINGULAIR) 10 MG tablet Take 1 tablet (10 mg total) by mouth at bedtime. For allergies (Patient not taking: Reported on 03/08/2019) 30 tablet 0  . SYMBICORT 80-4.5 MCG/ACT inhaler TAKE 2 PUFFS BY MOUTH TWICE A DAY (Patient not taking: Reported on 03/08/2019) 30.6 Inhaler 1   No current facility-administered medications for this visit.    Allergies: Allergies  Allergen Reactions  . Amoxicillin Other (See Comments)    Dizziness  . Other     TREES   I reviewed her past medical history, social history, family history, and environmental history and no significant changes have been reported from previous visit on 01/02/2019.  Review of Systems  Constitutional: Negative for appetite change, chills, fever and unexpected weight change.  HENT:  Positive for postnasal drip. Negative for congestion and rhinorrhea.   Eyes: Positive for itching.  Respiratory: Negative for cough, chest tightness, shortness of breath and wheezing.   Cardiovascular: Negative for chest pain.  Gastrointestinal: Negative for abdominal pain.  Genitourinary: Negative for difficulty urinating.  Skin: Negative for rash.  Allergic/Immunologic: Negative for environmental allergies and food allergies.  Neurological: Positive for headaches.   Objective: Physical Exam Not obtained as encounter was done via telephone.  Speaking in full sentences without coughing during telephone encounter.   Previous notes and tests were reviewed.  I discussed the assessment and treatment plan with the patient. The patient was provided an opportunity to ask questions and all were answered. The patient agreed with the plan and demonstrated an understanding of the instructions. After visit summary/patient instructions available via mychart.   The patient was advised to call back or seek an in-person evaluation if the symptoms worsen or if the condition fails to improve as anticipated.  I provided 16 minutes of non-face-to-face time during this encounter.  It was my pleasure to participate in [place] care today. Please feel free to contact me  with any questions or concerns.   Sincerely,  Rexene Alberts, DO Allergy & Immunology  [facility] office: [phone] [facility] office: [phone]

## 2019-03-08 NOTE — Assessment & Plan Note (Signed)
Past history - Coughing with post tussive emesis at times, wheezing, SOB and chest tightness for 4-5 years. Main triggers are: allergies?, infections, weather changes, exertion. Using albuterol a few times per week. Now on Qvar 80 1 puff BID x 1 year with some benefit. ? If Symbicort caused headaches. 2020 spirometry showed: moderate restriction with 6% improvement in FEV1 post bronchodilator treatment. Interim history - coughing resolved and stopped Breo a few weeks ago with no worsening symptoms. Usually flares during the fall and spring.  Daily controller medication(s): ? If coughing starts up again then restart Breo 100 1 puff once a day and rinse mouth afterwards and let us know.   Prior to physical activity:May use albuterol rescue inhaler 2 puffs 5 to 15 minutes prior to strenuous physical activities.  Rescue medications:May use albuterol rescue inhaler 2 puffs or nebulizer every 4 to 6 hours as needed for shortness of breath, chest tightness, coughing, and wheezing. Monitor frequency of use.   During upper respiratory infections: ADD Qvar 80 2 puffs twice a day and rinse mouth afterwards for 1-2 weeks.

## 2019-03-08 NOTE — Patient Instructions (Addendum)
Coughing  Daily controller medication(s): ? If coughing starts up again then restart Breo 100 1 puff once a day and rinse mouth afterwards and let us know.   Prior to physical activity:May use albuterol rescue inhaler 2 puffs 5 to 15 minutes prior to strenuous physical activities.  Rescue medications:May use albuterol rescue inhaler 2 puffs or nebulizer every 4 to 6 hours as needed for shortness of breath, chest tightness, coughing, and wheezing. Monitor frequency of use.   During upper respiratory infections: ADD Qvar 80 2 puffs twice a day and rinse mouth afterwards for 1-2 weeks.  Chronic rhinitis Past history - skin testing was negative to environmental allergens.  Continue Flonase 1-2 sprays once a day.   Nasal saline spray (i.e., Simply Saline) or nasal saline lavage (i.e., NeilMed) is recommended as needed and prior to medicated nasal sprays.  Continue to take Nexium.   If not improved then will refer to ENT.  Itchy eyes  She has history of dry eyes and will not prescribe any antihistamine eye drops as it can worsen the dry eyes.  May use OTC rewetting eye drops.  No recent eye exam and recommend to obtain one within this year.   Follow up in 5 months

## 2019-04-04 ENCOUNTER — Other Ambulatory Visit: Payer: Self-pay | Admitting: Allergy

## 2019-04-05 ENCOUNTER — Other Ambulatory Visit: Payer: Self-pay | Admitting: Family Medicine

## 2019-04-05 DIAGNOSIS — E039 Hypothyroidism, unspecified: Secondary | ICD-10-CM

## 2019-04-10 ENCOUNTER — Ambulatory Visit: Payer: BLUE CROSS/BLUE SHIELD | Admitting: Family Medicine

## 2019-08-07 ENCOUNTER — Other Ambulatory Visit: Payer: Self-pay

## 2019-08-07 ENCOUNTER — Encounter: Payer: Self-pay | Admitting: Allergy

## 2019-08-07 ENCOUNTER — Ambulatory Visit: Payer: BC Managed Care – PPO | Admitting: Allergy

## 2019-08-07 VITALS — BP 110/90 | HR 101 | Temp 97.9°F | Resp 16 | Ht 60.0 in | Wt 236.2 lb

## 2019-08-07 DIAGNOSIS — J4540 Moderate persistent asthma, uncomplicated: Secondary | ICD-10-CM

## 2019-08-07 DIAGNOSIS — R05 Cough: Secondary | ICD-10-CM | POA: Diagnosis not present

## 2019-08-07 DIAGNOSIS — R6889 Other general symptoms and signs: Secondary | ICD-10-CM | POA: Diagnosis not present

## 2019-08-07 DIAGNOSIS — R059 Cough, unspecified: Secondary | ICD-10-CM

## 2019-08-07 DIAGNOSIS — J310 Chronic rhinitis: Secondary | ICD-10-CM | POA: Diagnosis not present

## 2019-08-07 NOTE — Assessment & Plan Note (Signed)
Past history - Postnasal drip for the past 4 to 5 years mainly in the spring and fall.  Tried Allegra and Flonase with some benefit.  History of reflux and currently on Nexium. 2020 skin testing was negative to environmental allergens. Interim history - symptoms flared the last few weeks.   Continue Flonase 1-2 sprays once a day as needed.  Nasal saline spray (i.e., Simply Saline) or nasal saline lavage (i.e., NeilMed) is recommended as needed and prior to medicated nasal sprays.  Continue to takeNexium daily for your reflux.  Get environmental allergy panel via bloodwork.  If all negative and still having issues then consider ENT referral in the future.

## 2019-08-07 NOTE — Progress Notes (Signed)
Follow Up Note  RE: Tina Moreno MRN: [id number] DOB: 1966/02/20 Date of Office Visit: 08/07/2019  Referring provider: Leone Haven, MD Primary care provider: Leone Haven, MD  Chief Complaint: Cough  History of Present Illness: I had the pleasure of seeing Tina Moreno for a follow up visit at the [facility] [place] [facility] [place] on 08/07/2019. She is a 53 y.o. female, who is being followed for coughing, chronic rhinitis, itchy eyes. Today she is here for regular follow up visit.  Her previous allergy office visit was on 03/08/2019 with Dr. Maudie Mercury.   Coughing Patient was doing well during the summer with no Breo but the last few weeks noticed the coughing come back. Sometimes worse at night but occurs throughout the day. Used albuterol about 1 week ago due to exertion related issues. No URIs and no Qvar use since the last visit.  The last time Breo caused some powdery feeling in her mouth.   Chronic rhinitis Noticed some nasal congestion and PND the last few weeks. Using Flonase prn at night with good benefit.  Still taking Nexium daily for reflux with good benefit.  Itchy eyes  Using OTC rewetting eye drops as needed.   No recent eye exam.  Assessment and Plan: Tina Moreno is a 53 y.o. female with: Reactive airway disease Past history - Coughing with post tussive emesis at times, wheezing, SOB and chest tightness for 4-5 years. Main triggers are: allergies?, infections, weather changes, exertion. ? If Symbicort caused headaches. 2020 spirometry showed: moderate restriction with 6% improvement in FEV1 post bronchodilator treatment.  Usually flares during the fall and spring. Interim history - Was doing well up until a few weeks ago without a daily inhaler.   Today's spirometry showed mild restriction.  Daily controller medication(s): ? Restart Breo 100 1 puff once a day and rinse mouth afterwards. ? If the powder bothers you or if the coughing is not better  then let us know.  ? May have to take a daily inhaler during the spring and fall months when the coughing seems to occur.  Prior to physical activity:May use albuterol rescue inhaler 2 puffs 5 to 15 minutes prior to strenuous physical activities.  Rescue medications:May use albuterol rescue inhaler 2 puffs or nebulizer every 4 to 6 hours as needed for shortness of breath, chest tightness, coughing, and wheezing. Monitor frequency of use.   During upper respiratory infections: ADD Qvar 80 2 puffs twice a day and rinse mouth afterwards for 1-2 weeks.  Chronic rhinitis Past history - Postnasal drip for the past 4 to 5 years mainly in the spring and fall.  Tried Allegra and Flonase with some benefit.  History of reflux and currently on Nexium. 2020 skin testing was negative to environmental allergens. Interim history - symptoms flared the last few weeks.   Continue Flonase 1-2 sprays once a day as needed.  Nasal saline spray (i.e., Simply Saline) or nasal saline lavage (i.e., NeilMed) is recommended as needed and prior to medicated nasal sprays.  Continue to takeNexium daily for your reflux.  Get environmental allergy panel via bloodwork.  If all negative and still having issues then consider ENT referral in the future.   Itchy eyes Past history - history of dry eyes and will not prescribe any antihistamine eye drops as it can worsen the dry eyes. Interim history - unchanged symptoms.   May use OTC rewetting eye drops.  Recommend annual eye exam.   Return in about 5  months (around 01/05/2020).  Lab Orders     Allergens w/Total IgE Area 2  Diagnostics: Spirometry:  Tracings reviewed. Her effort: Good reproducible efforts. FVC: 2.03L FEV1: 1.61L, 67% predicted FEV1/FVC ratio: 79% Interpretation: Spirometry consistent with possible restrictive disease better than last spirometry. Please see scanned spirometry results for details.  Medication List:  Current Outpatient  Medications  Medication Sig Dispense Refill  . albuterol (PROVENTIL HFA;VENTOLIN HFA) 108 (90 Base) MCG/ACT inhaler Inhale 2 puffs into the lungs every 6 (six) hours as needed for wheezing or shortness of breath. 1 Inhaler 2  . ALPRAZolam (XANAX) 0.5 MG tablet Take 0.5 mg by mouth as needed.      . carisoprodol (SOMA) 350 MG tablet Take 1 tablet (350 mg total) by mouth 3 (three) times daily as needed. 30 tablet 0  . fluticasone (FLONASE) 50 MCG/ACT nasal spray Place 2 sprays into both nostrils daily as needed for allergies or rhinitis. 16 g 11  . ibuprofen (ADVIL,MOTRIN) 200 MG tablet Take 200 mg by mouth every 6 (six) hours as needed.      Marland Kitchen levothyroxine (SYNTHROID) 200 MCG tablet Take 1 tablet (200 mcg total) by mouth daily before breakfast. 90 tablet 1  . metFORMIN (GLUCOPHAGE) 500 MG tablet Take 1 tablet (500 mg total) by mouth 2 (two) times daily with a meal. 180 tablet 3  . beclomethasone (QVAR) 80 MCG/ACT inhaler Inhale into the lungs 2 (two) times daily.    Marland Kitchen BREO ELLIPTA 100-25 MCG/INH AEPB TAKE 1 PUFF BY MOUTH EVERY DAY (Patient not taking: Reported on 08/07/2019) 180 each 1   No current facility-administered medications for this visit.    Allergies: Allergies  Allergen Reactions  . Amoxicillin Other (See Comments)    Dizziness  . Other     TREES   I reviewed her past medical history, social history, family history, and environmental history and no significant changes have been reported from her previous visit.  Review of Systems  Constitutional: Negative for appetite change, chills, fever and unexpected weight change.  HENT: Positive for congestion and postnasal drip. Negative for rhinorrhea.   Eyes: Positive for itching.  Respiratory: Positive for cough. Negative for chest tightness, shortness of breath and wheezing.   Cardiovascular: Negative for chest pain.  Gastrointestinal: Negative for abdominal pain.  Genitourinary: Negative for difficulty urinating.  Skin: Negative  for rash.  Allergic/Immunologic: Negative for environmental allergies and food allergies.  Neurological: Negative for headaches.   Objective: BP 110/90 (BP Location: Left Arm, Patient Position: Sitting, Cuff Size: Large)   Pulse (!) 101   Temp 97.9 F (36.6 C) (Temporal)   Resp 16   Ht 5' (1.524 m)   Wt 236 lb 3.2 oz (107.1 kg)   SpO2 97%   BMI 46.13 kg/m  Body mass index is 46.13 kg/m. Physical Exam  Constitutional: She is oriented to person, place, and time. She appears well-developed and well-nourished.  HENT:  Head: Normocephalic and atraumatic.  Right Ear: External ear normal.  Left Ear: External ear normal.  Nose: Nose normal.  Mouth/Throat: Oropharynx is clear and moist.  Eyes: Conjunctivae and EOM are normal.  Neck: Neck supple.  Cardiovascular: Normal rate, regular rhythm and normal heart sounds. Exam reveals no gallop and no friction rub.  No murmur heard. Pulmonary/Chest: Effort normal and breath sounds normal. She has no wheezes. She has no rales.  Neurological: She is alert and oriented to person, place, and time.  Skin: Skin is warm. No rash noted.  Psychiatric: She  has a normal mood and affect. Her behavior is normal.  Nursing note and vitals reviewed.  Previous notes and tests were reviewed. The plan was reviewed with the patient/family, and all questions/concerned were addressed.  It was my pleasure to see Yaneliz today and participate in her care. Please feel free to contact me with any questions or concerns.  Sincerely,  Rexene Alberts, DO Allergy & Immunology  [facility] office: [phone] [facility] office: [place] office: [phone]

## 2019-08-07 NOTE — Assessment & Plan Note (Signed)
Past history - history of dry eyes and will not prescribe any antihistamine eye drops as it can worsen the dry eyes. Interim history - unchanged symptoms.   May use OTC rewetting eye drops.  Recommend annual eye exam.

## 2019-08-07 NOTE — Assessment & Plan Note (Signed)
Past history - Coughing with post tussive emesis at times, wheezing, SOB and chest tightness for 4-5 years. Main triggers are: allergies?, infections, weather changes, exertion. ? If Symbicort caused headaches. 2020 spirometry showed: moderate restriction with 6% improvement in FEV1 post bronchodilator treatment.  Usually flares during the fall and spring. Interim history - Was doing well up until a few weeks ago without a daily inhaler.   Today's spirometry showed mild restriction.  Daily controller medication(s): ? Restart Breo 100 1 puff once a day and rinse mouth afterwards. ? If the powder bothers you or if the coughing is not better then let us know.  ? May have to take a daily inhaler during the spring and fall months when the coughing seems to occur.  Prior to physical activity:May use albuterol rescue inhaler 2 puffs 5 to 15 minutes prior to strenuous physical activities.  Rescue medications:May use albuterol rescue inhaler 2 puffs or nebulizer every 4 to 6 hours as needed for shortness of breath, chest tightness, coughing, and wheezing. Monitor frequency of use.   During upper respiratory infections: ADD Qvar 80 2 puffs twice a day and rinse mouth afterwards for 1-2 weeks.

## 2019-08-07 NOTE — Patient Instructions (Addendum)
   Daily controller medication(s): ? Restart Breo 100 1 puff once a day and rinse mouth afterwards. ? If the powder bothers you or if the coughing is not better then let us know.  ? You may have to take a daily inhaler during the spring and fall months when your coughing is acting up.   Prior to physical activity:May use albuterol rescue inhaler 2 puffs 5 to 15 minutes prior to strenuous physical activities.  Rescue medications:May use albuterol rescue inhaler 2 puffs or nebulizer every 4 to 6 hours as needed for shortness of breath, chest tightness, coughing, and wheezing. Monitor frequency of use.   During upper respiratory infections: ADD Qvar 80 2 puffs twice a day and rinse mouth afterwards for 1-2 weeks. Control goals:  Full participation in all desired activities (may need albuterol before activity) Albuterol use two times or less a week on average (not counting use with activity) Cough interfering with sleep two times or less a month Oral steroids no more than once a year No hospitalizations  Chronic rhinitis 2020 skin testing was negative to environmental allergens.  Continue Flonase 1-2 sprays once a day as needed.  Nasal saline spray (i.e., Simply Saline) or nasal saline lavage (i.e., NeilMed) is recommended as needed and prior to medicated nasal sprays.  Continue to takeNexium daily for your reflux.  Get bloodwork:  We are ordering labs, so please allow 1-2 weeks for the results to come back. With the newly implemented Cures Act, the labs might be visible to you at the same time that they become visible to me. However, I will not address the results until all of the results are back, so please be patient.   Itchy eyes  May use OTC rewetting eye drops.  Recommend that you get your eyes checked.   Follow up in 5 months or sooner if needed.

## 2019-08-11 ENCOUNTER — Encounter: Payer: Self-pay | Admitting: Allergy

## 2019-08-11 LAB — ALLERGENS W/TOTAL IGE AREA 2

## 2019-12-20 DIAGNOSIS — H53143 Visual discomfort, bilateral: Secondary | ICD-10-CM | POA: Diagnosis not present

## 2020-08-02 ENCOUNTER — Telehealth: Payer: Self-pay | Admitting: Family Medicine

## 2020-08-02 NOTE — Telephone Encounter (Signed)
She has not been seen since 2020. These are both controlled substances and she will need to be seen before I will refill them.

## 2020-08-02 NOTE — Telephone Encounter (Signed)
Pt wanted a refill on carisoprodol (SOMA) 350 MG tablet and ALPRAZolam (XANAX) 0.5 MG tablet sent [facility] LOV 11-2018  Pt only wanted this rx do to a trip she leaves on Tuesday   Pt has scheduled a follow up for 12/7

## 2020-08-02 NOTE — Telephone Encounter (Signed)
That has already been informed to patient.

## 2020-08-27 ENCOUNTER — Encounter: Payer: Self-pay | Admitting: Family Medicine

## 2020-08-27 ENCOUNTER — Ambulatory Visit (INDEPENDENT_AMBULATORY_CARE_PROVIDER_SITE_OTHER): Payer: BC Managed Care – PPO | Admitting: Family Medicine

## 2020-08-27 ENCOUNTER — Other Ambulatory Visit: Payer: Self-pay

## 2020-08-27 VITALS — BP 118/70 | HR 85 | Temp 98.3°F | Ht 60.0 in | Wt 239.4 lb

## 2020-08-27 DIAGNOSIS — M549 Dorsalgia, unspecified: Secondary | ICD-10-CM | POA: Diagnosis not present

## 2020-08-27 DIAGNOSIS — E7800 Pure hypercholesterolemia, unspecified: Secondary | ICD-10-CM

## 2020-08-27 DIAGNOSIS — E039 Hypothyroidism, unspecified: Secondary | ICD-10-CM

## 2020-08-27 DIAGNOSIS — E119 Type 2 diabetes mellitus without complications: Secondary | ICD-10-CM

## 2020-08-27 DIAGNOSIS — Z1231 Encounter for screening mammogram for malignant neoplasm of breast: Secondary | ICD-10-CM

## 2020-08-27 DIAGNOSIS — Z124 Encounter for screening for malignant neoplasm of cervix: Secondary | ICD-10-CM

## 2020-08-27 DIAGNOSIS — F32A Depression, unspecified: Secondary | ICD-10-CM

## 2020-08-27 DIAGNOSIS — E559 Vitamin D deficiency, unspecified: Secondary | ICD-10-CM | POA: Diagnosis not present

## 2020-08-27 DIAGNOSIS — Z1211 Encounter for screening for malignant neoplasm of colon: Secondary | ICD-10-CM

## 2020-08-27 DIAGNOSIS — D75839 Thrombocytosis, unspecified: Secondary | ICD-10-CM

## 2020-08-27 DIAGNOSIS — F419 Anxiety disorder, unspecified: Secondary | ICD-10-CM | POA: Diagnosis not present

## 2020-08-27 DIAGNOSIS — G8929 Other chronic pain: Secondary | ICD-10-CM

## 2020-08-27 LAB — LIPID PANEL
Cholesterol: 234 mg/dL — ABNORMAL HIGH (ref 0–200)
HDL: 39.1 mg/dL (ref >39.00)
NonHDL: 194.99
Total CHOL/HDL Ratio: 6
Triglycerides: 217 mg/dL — ABNORMAL HIGH (ref 0.0–149.0)
VLDL: 43.4 mg/dL — ABNORMAL HIGH (ref 0.0–40.0)

## 2020-08-27 LAB — CBC WITH DIFFERENTIAL/PLATELET
Basophils Absolute: 0.1 10*3/uL (ref 0.0–0.1)
Basophils Relative: 1.2 % (ref 0.0–3.0)
Eosinophils Absolute: 0.3 10*3/uL (ref 0.0–0.7)
Eosinophils Relative: 2.8 % (ref 0.0–5.0)
HCT: 40.5 % (ref 36.0–46.0)
Hemoglobin: 13.2 g/dL (ref 12.0–15.0)
Lymphocytes Relative: 31.5 % (ref 12.0–46.0)
Lymphs Abs: 3.7 10*3/uL (ref 0.7–4.0)
MCHC: 32.5 g/dL (ref 30.0–36.0)
MCV: 87.7 fl (ref 78.0–100.0)
Monocytes Absolute: 0.5 10*3/uL (ref 0.1–1.0)
Monocytes Relative: 4.1 % (ref 3.0–12.0)
Neutro Abs: 7.1 10*3/uL (ref 1.4–7.7)
Neutrophils Relative %: 60.4 % (ref 43.0–77.0)
Platelets: 412 10*3/uL — ABNORMAL HIGH (ref 150.0–400.0)
RBC: 4.61 Mil/uL (ref 3.87–5.11)
RDW: 15.4 % (ref 11.5–15.5)
WBC: 11.8 10*3/uL — ABNORMAL HIGH (ref 4.0–10.5)

## 2020-08-27 LAB — COMPREHENSIVE METABOLIC PANEL
ALT: 20 U/L (ref 0–35)
AST: 27 U/L (ref 0–37)
Albumin: 4.2 g/dL (ref 3.5–5.2)
Alkaline Phosphatase: 94 U/L (ref 39–117)
BUN: 11 mg/dL (ref 6–23)
CO2: 28 mEq/L (ref 19–32)
Calcium: 9.3 mg/dL (ref 8.4–10.5)
Chloride: 99 mEq/L (ref 96–112)
Creatinine, Ser: 0.7 mg/dL (ref 0.40–1.20)
GFR: 99.71 mL/min (ref >60.00)
Glucose, Bld: 223 mg/dL — ABNORMAL HIGH (ref 70–99)
Potassium: 4 mEq/L (ref 3.5–5.1)
Sodium: 138 mEq/L (ref 135–145)
Total Bilirubin: 0.4 mg/dL (ref 0.2–1.2)
Total Protein: 7.4 g/dL (ref 6.0–8.3)

## 2020-08-27 LAB — MICROALBUMIN / CREATININE URINE RATIO
Creatinine,U: 180.9 mg/dL
Microalb Creat Ratio: 1.6 mg/g (ref 0.0–30.0)
Microalb, Ur: 2.9 mg/dL — ABNORMAL HIGH (ref 0.0–1.9)

## 2020-08-27 LAB — LDL CHOLESTEROL, DIRECT: Direct LDL: 163 mg/dL

## 2020-08-27 LAB — HEMOGLOBIN A1C: Hgb A1c MFr Bld: 9.9 % — ABNORMAL HIGH (ref 4.6–6.5)

## 2020-08-27 LAB — VITAMIN D 25 HYDROXY (VIT D DEFICIENCY, FRACTURES): VITD: 14.07 ng/mL — ABNORMAL LOW (ref 30.00–100.00)

## 2020-08-27 LAB — TSH: TSH: 80.59 u[IU]/mL — ABNORMAL HIGH (ref 0.35–4.50)

## 2020-08-27 MED ORDER — LEVOTHYROXINE SODIUM 175 MCG PO TABS: 175.0000 ug | ORAL_TABLET | Freq: Every day | ORAL | 1 refills | Status: DC

## 2020-08-27 NOTE — Progress Notes (Signed)
Tommi Rumps, MD Phone: [phone]  Tina Moreno is a 54 y.o. female who presents today for f/u. Patient lost to follow-up for >1.5 years  DIABETES Disease Monitoring: Blood Sugar ranges-not checking Polyuria/phagia/dipsia- no      Optho- UTD Medications: Compliance- no meds, could not tolerate metformin due to diarrhea  HYPOTHYROIDISM Disease Monitoring Weight changes: no  Skin Changes: dry skin, brittle nails Heat/Cold intolerance: no  Medication Monitoring Compliance:  Not currently on medication, was last on synthroid 175 mcg   Last TSH:   Lab Results  Component Value Date   TSH 12.88 (H) 01/12/2019   Depression: still having issues with this. She previously saw psychiatry though did not like the person they switched her to. She has not been on medication and she would prefer to try therapy first this time. No SI.   Chronic neck and back pain: ongoing issues with this mostly in her upper back and neck. Has tightness. Occasional radiation to the ulnar aspect of her right arm. She reports having had x-rays a number of years ago for this that revealed arthritis. She did PT previously with some benefit and would like to see PT again. Denies weakness.    Social History   Tobacco Use  Smoking Status Never Smoker  Smokeless Tobacco Never Used     ROS see history of present illness  Objective  Physical Exam Vitals:   08/27/20 0916  BP: 118/70  Pulse: 85  Temp: 98.3 F (36.8 C)  SpO2: 98%    BP Readings from Last 3 Encounters:  08/27/20 118/70  08/07/19 110/90  01/02/19 (!) 118/94   Wt Readings from Last 3 Encounters:  08/27/20 239 lb 6.4 oz (108.6 kg)  08/07/19 236 lb 3.2 oz (107.1 kg)  01/02/19 244 lb (110.7 kg)    Physical Exam Constitutional:      General: She is not in acute distress.    Appearance: She is not diaphoretic.  Neck:     Thyroid: No thyroid mass, thyromegaly or thyroid tenderness.  Cardiovascular:     Rate and Rhythm: Normal  rate and regular rhythm.     Heart sounds: Normal heart sounds.  Pulmonary:     Effort: Pulmonary effort is normal.     Breath sounds: Normal breath sounds.  Musculoskeletal:     Right lower leg: No edema.     Left lower leg: No edema.     Comments: No midline spine tenderness, no midline spin step off, no muscular back tenderness, trapezius muscles tight bilaterally  Skin:    General: Skin is warm and dry.  Neurological:     Mental Status: She is alert.     Comments: 5/5 strength in bilateral biceps, triceps, grip, quads, hamstrings, plantar and dorsiflexion, sensation to light touch intact in bilateral UE and LE, normal gait, 2+ patellar, brachioradialis, and biceps reflexes       Assessment/Plan: Please see individual problem list.  Problem List Items Addressed This Visit    Anxiety and depression    Ongoing issues with this. We will give her contact information for a therapist in [place] for her to contact.       Back pain    Chronic intermittent issue. Refer to PT for evaluation and treatment.       Relevant Orders   Ambulatory referral to Physical Therapy   Diabetes ([place]) - Primary    Undetermined control. Check A1c. We will likely trial metformin XR 500 mg BID.  Relevant Orders   HgB A1c   Urine Microalbumin w/creat. ratio   Elevated LDL cholesterol level    Check lipid panel.       Relevant Orders   Lipid panel   Comp Met (CMET)   Hypothyroid    Restart synthroid 175 mcg. Check TSH.      Relevant Medications   levothyroxine (SYNTHROID) 175 MCG tablet   Other Relevant Orders   TSH   Thrombocytosis    Check CBC with Diff.      Relevant Orders   CBC w/Diff   Vitamin D deficiency    Check vitamin D.       Relevant Orders   Vitamin D (25 hydroxy)    Other Visit Diagnoses    Encounter for screening mammogram for malignant neoplasm of breast       Relevant Orders   MM 3D SCREEN BREAST BILATERAL   Colon cancer screening       Relevant Orders    Ambulatory referral to Gastroenterology   Cervical cancer screening       Relevant Orders   Ambulatory referral to Gynecology      This visit occurred during the SARS-CoV-2 public health emergency.  Safety protocols were in place, including screening questions prior to the visit, additional usage of staff PPE, and extensive cleaning of exam room while observing appropriate contact time as indicated for disinfecting solutions.   I have spent 41 minutes in the care of this patient regarding history taking, documentation, placing orders, physical exam, discussion of plan.   Tommi Rumps, MD [place]

## 2020-08-27 NOTE — Assessment & Plan Note (Signed)
Check lipid panel  

## 2020-08-27 NOTE — Patient Instructions (Signed)
Nice to see you.  We will refer you to Gi for a colonoscopy, GYN for a pap smear, and PT for therapy for your back.  We will contact you with your labs. We will restart your synthroid.

## 2020-08-27 NOTE — Assessment & Plan Note (Signed)
Chronic intermittent issue. Refer to PT for evaluation and treatment.

## 2020-08-27 NOTE — Assessment & Plan Note (Signed)
Undetermined control. Check A1c. We will likely trial metformin XR 500 mg BID.

## 2020-08-27 NOTE — Assessment & Plan Note (Signed)
Ongoing issues with this. We will give her contact information for a therapist in [place] for her to contact.

## 2020-08-27 NOTE — Assessment & Plan Note (Signed)
Restart synthroid 175 mcg. Check TSH.

## 2020-08-27 NOTE — Assessment & Plan Note (Signed)
Check vitamin D. 

## 2020-08-27 NOTE — Assessment & Plan Note (Signed)
Check CBC with Diff.

## 2020-08-30 ENCOUNTER — Other Ambulatory Visit: Payer: Self-pay | Admitting: Allergy

## 2020-09-01 ENCOUNTER — Other Ambulatory Visit: Payer: Self-pay | Admitting: Family Medicine

## 2020-09-02 NOTE — Telephone Encounter (Signed)
Attempted to call patient to schedule a follow up appointment, unable to schedule due to mailbox being full. A courtesy refill was sent. No further refill until appointment is made per policy.

## 2020-09-03 ENCOUNTER — Encounter: Payer: Self-pay | Admitting: Family Medicine

## 2020-09-03 DIAGNOSIS — E119 Type 2 diabetes mellitus without complications: Secondary | ICD-10-CM

## 2020-09-03 DIAGNOSIS — E7800 Pure hypercholesterolemia, unspecified: Secondary | ICD-10-CM

## 2020-09-03 DIAGNOSIS — E559 Vitamin D deficiency, unspecified: Secondary | ICD-10-CM

## 2020-09-04 ENCOUNTER — Telehealth (INDEPENDENT_AMBULATORY_CARE_PROVIDER_SITE_OTHER): Payer: Self-pay | Admitting: Gastroenterology

## 2020-09-04 ENCOUNTER — Other Ambulatory Visit: Payer: Self-pay

## 2020-09-04 DIAGNOSIS — Z1211 Encounter for screening for malignant neoplasm of colon: Secondary | ICD-10-CM

## 2020-09-04 MED ORDER — NA SULFATE-K SULFATE-MG SULF 17.5-3.13-1.6 GM/177ML PO SOLN: 1.0000 | Freq: Once | ORAL | 0 refills | Status: AC

## 2020-09-04 NOTE — Progress Notes (Signed)
Gastroenterology Pre-Procedure Review  Request Date: 10/01/20 Requesting Physician: Dr. Marius Ditch  PATIENT REVIEW QUESTIONS: The patient responded to the following health history questions as indicated:    1. Are you having any GI issues? no 2. Do you have a personal history of Polyps? no 3. Do you have a family history of Colon Cancer or Polyps? yes (Grandmother colon cancer) 4. Diabetes Mellitus? YES 5. Joint replacements in the past 12 months?no 6. Major health problems in the past 3 months?no 7. Any artificial heart valves, MVP, or defibrillator?no    MEDICATIONS & ALLERGIES:    Patient reports the following regarding taking any anticoagulation/antiplatelet therapy:   Plavix, Coumadin, Eliquis, Xarelto, Lovenox, Pradaxa, Brilinta, or Effient? no Aspirin? no  Patient confirms/reports the following medications:  Current Outpatient Medications  Medication Sig Dispense Refill  . albuterol (PROVENTIL HFA;VENTOLIN HFA) 108 (90 Base) MCG/ACT inhaler Inhale 2 puffs into the lungs every 6 (six) hours as needed for wheezing or shortness of breath. 1 Inhaler 2  . beclomethasone (QVAR) 80 MCG/ACT inhaler Inhale into the lungs 2 (two) times daily.    Marland Kitchen BREO ELLIPTA 100-25 MCG/INH AEPB INHALE 1 PUFF BY MOUTH EVERY DAY 60 each 0  . fluticasone (FLONASE) 50 MCG/ACT nasal spray Place 2 sprays into both nostrils daily as needed for allergies or rhinitis. 16 g 11  . ibuprofen (ADVIL,MOTRIN) 200 MG tablet Take 200 mg by mouth every 6 (six) hours as needed.    Marland Kitchen levothyroxine (SYNTHROID) 175 MCG tablet Take 1 tablet (175 mcg total) by mouth daily before breakfast. 90 tablet 1  . Na Sulfate-K Sulfate-Mg Sulf 17.5-3.13-1.6 GM/177ML SOLN Take 1 kit by mouth once for 1 dose. 354 mL 0   No current facility-administered medications for this visit.    Patient confirms/reports the following allergies:  Allergies  Allergen Reactions  . Amoxicillin Other (See Comments)    Dizziness  . Other     TREES     No orders of the defined types were placed in this encounter.   AUTHORIZATION INFORMATION Primary Insurance: 1D#: Group #:  Secondary Insurance: 1D#: Group #:  SCHEDULE INFORMATION: Date: 10/01/20 Time: Location:[facility]

## 2020-09-05 MED ORDER — METFORMIN HCL ER 500 MG PO TB24
ORAL_TABLET | ORAL | 1 refills | Status: DC
Start: 2020-09-05 — End: 2021-02-24

## 2020-09-05 MED ORDER — VITAMIN D (ERGOCALCIFEROL) 1.25 MG (50000 UNIT) PO CAPS: 50000.0000 [IU] | ORAL_CAPSULE | ORAL | 0 refills | Status: DC

## 2020-09-05 MED ORDER — ROSUVASTATIN CALCIUM 40 MG PO TABS
40.0000 mg | ORAL_TABLET | Freq: Every day | ORAL | 3 refills | Status: DC
Start: 2020-09-05 — End: 2022-02-25

## 2020-09-11 MED ORDER — CARISOPRODOL 350 MG PO TABS: 350.0000 mg | ORAL_TABLET | Freq: Three times a day (TID) | ORAL | 0 refills | Status: DC | PRN

## 2020-09-11 MED ORDER — ALPRAZOLAM 0.25 MG PO TABS: 0.2500 mg | ORAL_TABLET | Freq: Every day | ORAL | 0 refills | Status: DC | PRN

## 2020-09-11 MED ORDER — EMPAGLIFLOZIN 10 MG PO TABS: 10.0000 mg | ORAL_TABLET | Freq: Every day | ORAL | 2 refills | Status: DC

## 2020-09-27 ENCOUNTER — Other Ambulatory Visit: Payer: Self-pay

## 2020-09-27 ENCOUNTER — Other Ambulatory Visit
Admission: RE | Admit: 2020-09-27 | Discharge: 2020-09-27 | Disposition: A | Discharge status: Home / Self Care | Payer: BC Managed Care – PPO | Source: Ambulatory Visit | Attending: Gastroenterology | Admitting: Gastroenterology

## 2020-09-27 DIAGNOSIS — Z20822 Contact with and (suspected) exposure to covid-19: Secondary | ICD-10-CM | POA: Insufficient documentation

## 2020-09-27 DIAGNOSIS — Z01812 Encounter for preprocedural laboratory examination: Secondary | ICD-10-CM | POA: Insufficient documentation

## 2020-09-27 LAB — SARS CORONAVIRUS 2 (TAT 6-24 HRS): SARS Coronavirus 2: NEGATIVE

## 2020-10-01 ENCOUNTER — Ambulatory Visit
Admission: RE | Admit: 2020-10-01 | Discharge: 2020-10-01 | Disposition: A | Discharge status: Home / Self Care | Payer: BC Managed Care – PPO | Attending: Gastroenterology | Admitting: Gastroenterology

## 2020-10-01 ENCOUNTER — Encounter: Payer: Self-pay | Admitting: Gastroenterology

## 2020-10-01 ENCOUNTER — Encounter
Admission: RE | Disposition: A | Discharge status: Home / Self Care | Payer: Self-pay | Source: Home / Self Care | Attending: Gastroenterology

## 2020-10-01 ENCOUNTER — Ambulatory Visit: Payer: BC Managed Care – PPO | Admitting: Registered Nurse

## 2020-10-01 DIAGNOSIS — Z1211 Encounter for screening for malignant neoplasm of colon: Secondary | ICD-10-CM | POA: Insufficient documentation

## 2020-10-01 DIAGNOSIS — Z79899 Other long term (current) drug therapy: Secondary | ICD-10-CM | POA: Diagnosis not present

## 2020-10-01 DIAGNOSIS — Z880 Allergy status to penicillin: Secondary | ICD-10-CM | POA: Diagnosis not present

## 2020-10-01 DIAGNOSIS — Z7951 Long term (current) use of inhaled steroids: Secondary | ICD-10-CM | POA: Diagnosis not present

## 2020-10-01 DIAGNOSIS — Z7984 Long term (current) use of oral hypoglycemic drugs: Secondary | ICD-10-CM | POA: Insufficient documentation

## 2020-10-01 DIAGNOSIS — K219 Gastro-esophageal reflux disease without esophagitis: Secondary | ICD-10-CM | POA: Diagnosis not present

## 2020-10-01 DIAGNOSIS — Z79890 Hormone replacement therapy (postmenopausal): Secondary | ICD-10-CM | POA: Insufficient documentation

## 2020-10-01 DIAGNOSIS — F418 Other specified anxiety disorders: Secondary | ICD-10-CM | POA: Diagnosis not present

## 2020-10-01 DIAGNOSIS — E039 Hypothyroidism, unspecified: Secondary | ICD-10-CM | POA: Diagnosis not present

## 2020-10-01 HISTORY — PX: COLONOSCOPY WITH PROPOFOL: SHX5780

## 2020-10-01 LAB — GLUCOSE, CAPILLARY: Glucose-Capillary: 142 mg/dL — ABNORMAL HIGH (ref 70–99)

## 2020-10-01 SURGERY — COLONOSCOPY WITH PROPOFOL
Anesthesia: General

## 2020-10-01 MED ORDER — PROPOFOL 10 MG/ML IV BOLUS
INTRAVENOUS | Status: DC | PRN
  Administered 2020-10-01: 90 mg via INTRAVENOUS

## 2020-10-01 MED ORDER — PROPOFOL 500 MG/50ML IV EMUL
INTRAVENOUS | Status: AC
  Filled 2020-10-01: qty 50

## 2020-10-01 MED ORDER — LIDOCAINE HCL (PF) 2 % IJ SOLN
INTRAMUSCULAR | Status: AC
  Filled 2020-10-01: qty 5

## 2020-10-01 MED ORDER — LIDOCAINE HCL (CARDIAC) PF 100 MG/5ML IV SOSY
PREFILLED_SYRINGE | INTRAVENOUS | Status: DC | PRN
  Administered 2020-10-01: 60 mg via INTRAVENOUS

## 2020-10-01 MED ORDER — PROPOFOL 500 MG/50ML IV EMUL
INTRAVENOUS | Status: DC | PRN
  Administered 2020-10-01: 150 ug/kg/min via INTRAVENOUS

## 2020-10-01 MED ORDER — SODIUM CHLORIDE 0.9 % IV SOLN
INTRAVENOUS | Status: DC
  Administered 2020-10-01: 1000 mL via INTRAVENOUS

## 2020-10-01 NOTE — Anesthesia Postprocedure Evaluation (Signed)
Anesthesia Post Note  Patient: Tina Moreno  Procedure(s) Performed: COLONOSCOPY WITH PROPOFOL (N/A )  Patient location during evaluation: Endoscopy Anesthesia Type: General Level of consciousness: awake and alert Pain management: pain level controlled Vital Signs Assessment: post-procedure vital signs reviewed and stable Respiratory status: spontaneous breathing and respiratory function stable Cardiovascular status: stable Anesthetic complications: no   No complications documented.   Last Vitals:  Vitals:   10/01/20 1134 10/01/20 1144  BP:  116/70  Pulse:    Resp:    Temp:    SpO2: 99% 100%    Last Pain:  Vitals:   10/01/20 1144  TempSrc:   PainSc: 0-No pain                 Danyeal Akens K

## 2020-10-01 NOTE — Op Note (Signed)
[facility] Gastroenterology Patient Name: Tina Moreno Procedure Date: 10/01/2020 10:51 AM MRN: [id number] Account #: [id number] Date of Birth: 09-11-1966 Admit Type: Outpatient Age: 55 Room: [facility] ENDO ROOM 3 Gender: Female Note Status: Finalized Procedure:             Colonoscopy Indications:           Screening for colorectal malignant neoplasm, This is                         the patient's first colonoscopy Providers:             Lin Landsman MD, MD Referring MD:          Angela Adam. Caryl Bis (Referring MD) Medicines:             General Anesthesia Complications:         No immediate complications. Estimated blood loss: None. Procedure:             Pre-Anesthesia Assessment:                        - Prior to the procedure, a History and Physical was                         performed, and patient medications and allergies were                         reviewed. The patient is competent. The risks and                         benefits of the procedure and the sedation options and                         risks were discussed with the patient. All questions                         were answered and informed consent was obtained.                         Patient identification and proposed procedure were                         verified by the physician, the nurse, the                         anesthesiologist, the anesthetist and the technician                         in the pre-procedure area in the procedure room in the                         endoscopy suite. Mental Status Examination: alert and                         oriented. Airway Examination: normal oropharyngeal                         airway and neck mobility. Respiratory Examination:  clear to auscultation. CV Examination: normal.                         Prophylactic Antibiotics: The patient does not require                         prophylactic antibiotics. Prior  Anticoagulants: The                         patient has taken no previous anticoagulant or                         antiplatelet agents. ASA Grade Assessment: III - A                         patient with severe systemic disease. After reviewing                         the risks and benefits, the patient was deemed in                         satisfactory condition to undergo the procedure. The                         anesthesia plan was to use general anesthesia.                         Immediately prior to administration of medications,                         the patient was re-assessed for adequacy to receive                         sedatives. The heart rate, respiratory rate, oxygen                         saturations, blood pressure, adequacy of pulmonary                         ventilation, and response to care were monitored                         throughout the procedure. The physical status of the                         patient was re-assessed after the procedure.                        After obtaining informed consent, the colonoscope was                         passed under direct vision. Throughout the procedure,                         the patient's blood pressure, pulse, and oxygen                         saturations were monitored continuously. The  Colonoscope was introduced through the anus and                         advanced to the the cecum, identified by appendiceal                         orifice and ileocecal valve. The colonoscopy was                         performed with moderate difficulty due to significant                         looping. Successful completion of the procedure was                         aided by applying abdominal pressure. The patient                         tolerated the procedure well. The quality of the bowel                         preparation was evaluated using the BBPS [facility] Bowel                          Preparation Scale) with scores of: Right Colon = 3,                         Transverse Colon = 3 and Left Colon = 3 (entire mucosa                         seen well with no residual staining, small fragments                         of stool or opaque liquid). The total BBPS score                         equals 9. Findings:      The perianal and digital rectal examinations were normal. Pertinent       negatives include normal sphincter tone and no palpable rectal lesions.      The entire examined colon appeared normal.      The retroflexed view of the distal rectum and anal verge was normal and       showed no anal or rectal abnormalities.      The terminal ileum appeared normal. Impression:            - The entire examined colon is normal.                        - The distal rectum and anal verge are normal on                         retroflexion view.                        - No specimens collected. Recommendation:        - Discharge patient to home (with escort).                        -  Resume previous diet today.                        - Continue present medications.                        - Repeat colonoscopy in 10 years for screening                         purposes. Procedure Code(s):     --- Professional ---                        [id number], Colorectal cancer screening; colonoscopy on                         individual not meeting criteria for high risk Diagnosis Code(s):     --- Professional ---                        Z12.11, Encounter for screening for malignant neoplasm                         of colon CPT copyright 2019 American Medical Association. All rights reserved. The codes documented in this report are preliminary and upon coder review may  be revised to meet current compliance requirements. Dr. Ulyess Mort Lin Landsman MD, MD 10/01/2020 11:10:22 AM This report has been signed electronically. Number of Addenda: 0 Note Initiated On: 10/01/2020 10:51 AM Scope  Withdrawal Time: 0 hours 6 minutes 11 seconds  Total Procedure Duration: 0 hours 11 minutes 4 seconds  Estimated Blood Loss:  Estimated blood loss: none.      [facility]

## 2020-10-01 NOTE — Transfer of Care (Signed)
Immediate Anesthesia Transfer of Care Note  Patient: Tina Moreno  Procedure(s) Performed: Procedure(s): COLONOSCOPY WITH PROPOFOL (N/A)  Patient Location: PACU and Endoscopy Unit  Anesthesia Type:General  Level of Consciousness: sedated  Airway & Oxygen Therapy: Patient Spontanous Breathing and Patient connected to nasal cannula oxygen  Post-op Assessment: Report given to RN and Post -op Vital signs reviewed and stable  Post vital signs: Reviewed and stable  Last Vitals:  Vitals:   10/01/20 1023 10/01/20 1114  BP: (!) 136/98 116/85  Pulse:  96  Resp: 18 (!) 23  Temp: (!) 36 C 36.9 C  SpO2: 93% 90%    Complications: No apparent anesthesia complications

## 2020-10-01 NOTE — Anesthesia Preprocedure Evaluation (Signed)
Anesthesia Evaluation  Patient identified by MRN, date of birth, ID band Patient awake    Reviewed: Allergy & Precautions, NPO status , Patient's Chart, lab work & pertinent test results  History of Anesthesia Complications Negative for: history of anesthetic complications  Airway Mallampati: III       Dental   Pulmonary asthma (no inhlaers for 1 month) , neg sleep apnea, neg COPD, Not current smoker,           Cardiovascular (-) hypertension(-) Past MI and (-) CHF (-) dysrhythmias (-) Valvular Problems/Murmurs     Neuro/Psych neg Seizures Anxiety Depression    GI/Hepatic Neg liver ROS, GERD  Medicated and Controlled,  Endo/Other  diabetes, Type 2, Oral Hypoglycemic AgentsHypothyroidism   Renal/GU negative Renal ROS     Musculoskeletal   Abdominal   Peds  Hematology   Anesthesia Other Findings   Reproductive/Obstetrics                             Anesthesia Physical Anesthesia Plan  ASA: III  Anesthesia Plan: General   Post-op Pain Management:    Induction: Intravenous  PONV Risk Score and Plan: 3  Airway Management Planned: Nasal Cannula  Additional Equipment:   Intra-op Plan:   Post-operative Plan:   Informed Consent: I have reviewed the patients History and Physical, chart, labs and discussed the procedure including the risks, benefits and alternatives for the proposed anesthesia with the patient or authorized representative who has indicated his/her understanding and acceptance.       Plan Discussed with:   Anesthesia Plan Comments:         Anesthesia Quick Evaluation

## 2020-10-01 NOTE — H&P (Signed)
Cephas Darby, MD [address]  [place]  [place], [place] [address]  Main: [phone]  Fax: [phone] Pager: [phone]  Primary Care Physician:  Leone Haven, MD Primary Gastroenterologist:  Dr. Cephas Darby  Pre-Procedure History & Physical: HPI:  Tina Moreno is a 55 y.o. female is here for an colonoscopy.   Past Medical History:  Diagnosis Date  . Abnormal Pap smear of cervix   . Anxiety   . Asthma   . Back pain   . Cervical dysplasia   . Depression   . Elevated WBCs    NEGATIVE HEM W/U  . GERD (gastroesophageal reflux disease)   . Hypothyroid     Past Surgical History:  Procedure Laterality Date  . COLPOSCOPY    . GYNECOLOGIC CRYOSURGERY      Prior to Admission medications   Medication Sig Start Date End Date Taking? Authorizing Provider  albuterol (PROVENTIL HFA;VENTOLIN HFA) 108 (90 Base) MCG/ACT inhaler Inhale 2 puffs into the lungs every 6 (six) hours as needed for wheezing or shortness of breath. 11/10/18   McLean-Scocuzza, Nino Glow, MD  ALPRAZolam Duanne Moron) 0.25 MG tablet Take 1 tablet (0.25 mg total) by mouth daily as needed for anxiety. 09/11/20   Leone Haven, MD  beclomethasone (QVAR) 80 MCG/ACT inhaler Inhale into the lungs 2 (two) times daily.    [provider]  BREO ELLIPTA 100-25 MCG/INH AEPB INHALE 1 PUFF BY MOUTH EVERY DAY 09/02/20   Garnet Sierras, DO  carisoprodol (SOMA) 350 MG tablet Take 1 tablet (350 mg total) by mouth 3 (three) times daily as needed for muscle spasms. 09/11/20   Leone Haven, MD  empagliflozin (JARDIANCE) 10 MG TABS tablet Take 1 tablet (10 mg total) by mouth daily before breakfast. 09/11/20   Leone Haven, MD  fluticasone [facility]) 50 MCG/ACT nasal spray Place 2 sprays into both nostrils daily as needed for allergies or rhinitis. 11/10/18   McLean-Scocuzza, Nino Glow, MD  ibuprofen (ADVIL,MOTRIN) 200 MG tablet Take 200 mg by mouth every 6 (six) hours as needed.    [provider]  levothyroxine (SYNTHROID) 175 MCG tablet Take 1 tablet (175 mcg total) by mouth daily before breakfast. 08/27/20   Leone Haven, MD  metFORMIN (GLUCOPHAGE XR) 500 MG 24 hr tablet Take 1 tablet (500 mg) PO twice daily with meals for one week, then take 2 tablets with breakfast and 1 tablet with dinner for 1 week, then take 2 tablets twice daily with meals ongoing 09/05/20   Leone Haven, MD  rosuvastatin (CRESTOR) 40 MG tablet Take 1 tablet (40 mg total) by mouth daily. 09/05/20   Leone Haven, MD  Vitamin D, Ergocalciferol, (DRISDOL) 1.25 MG (50000 UNIT) CAPS capsule Take 1 capsule (50,000 Units total) by mouth every 7 (seven) days. 09/05/20   Leone Haven, MD    Allergies as of 09/04/2020 - Review Complete 09/04/2020  Allergen Reaction Noted  . Amoxicillin Other (See Comments)   . Other  01/11/2012    Family History  Problem Relation Age of Onset  . Parkinsonism Father   . Diabetes Father   . Hypertension Father   . Cancer Maternal Aunt        SOFT CELL SARCOMA  . Cancer Maternal Uncle        TESTICULAR  . Cancer Paternal Grandmother        COLON  . Cancer Son        TESTICULAR  Social History   Socioeconomic History  . Marital status: Single    Spouse name: Not on file  . Number of children: Not on file  . Years of education: Not on file  . Highest education level: Not on file  Occupational History  . Not on file  Tobacco Use  . Smoking status: Never Smoker  . Smokeless tobacco: Never Used  Vaping Use  . Vaping Use: Never used  Substance and Sexual Activity  . Alcohol use: Yes    Comment: OCCASSIONALLY  . Drug use: No  . Sexual activity: Not Currently  Other Topics Concern  . Not on file  Social History Narrative  . Not on file   Social Determinants of Health   Financial Resource Strain: Not on file  Food Insecurity: Not on file  Transportation Needs: Not on file  Physical Activity: Not on file  Stress: Not on file   Social Connections: Not on file  Intimate Partner Violence: Not on file    Review of Systems: See HPI, otherwise negative ROS  Physical Exam: BP (!) 136/98   Temp (!) 96.8 F (36 C) (Temporal)   Resp 18   Ht 5\' 1"  (1.549 m)   Wt 102.7 kg   LMP  (LMP Unknown) Comment: postmenopausal  SpO2 98%   BMI 42.79 kg/m  General:   Alert,  pleasant and cooperative in NAD Head:  Normocephalic and atraumatic. Neck:  Supple; no masses or thyromegaly. Lungs:  Clear throughout to auscultation.    Heart:  Regular rate and rhythm. Abdomen:  Soft, nontender and nondistended. Normal bowel sounds, without guarding, and without rebound.   Neurologic:  Alert and  oriented x4;  grossly normal neurologically.  Impression/Plan: Tina Moreno is here for an colonoscopy to be performed for colon cancer screening  Risks, benefits, limitations, and alternatives regarding  colonoscopy have been reviewed with the patient.  Questions have been answered.  All parties agreeable.   Sherri Sear, MD  10/01/2020, 10:48 AM

## 2020-10-01 NOTE — Anesthesia Procedure Notes (Signed)
Date/Time: 10/01/2020 10:54 AM Performed by: Doreen Salvage, CRNA Pre-anesthesia Checklist: Patient identified, Emergency Drugs available, Suction available and Patient being monitored Patient Re-evaluated:Patient Re-evaluated prior to induction Oxygen Delivery Method: Nasal cannula Induction Type: IV induction Dental Injury: Teeth and Oropharynx as per pre-operative assessment  Comments: Nasal cannula with etCO2 monitoring

## 2020-10-02 ENCOUNTER — Encounter: Payer: Self-pay | Admitting: Gastroenterology

## 2020-10-02 ENCOUNTER — Encounter: Payer: Self-pay | Admitting: Family Medicine

## 2020-10-08 ENCOUNTER — Other Ambulatory Visit: Payer: BC Managed Care – PPO

## 2020-10-08 ENCOUNTER — Encounter: Payer: Self-pay | Admitting: *Deleted

## 2020-10-10 ENCOUNTER — Other Ambulatory Visit: Payer: Self-pay

## 2020-10-10 ENCOUNTER — Other Ambulatory Visit (INDEPENDENT_AMBULATORY_CARE_PROVIDER_SITE_OTHER): Payer: BC Managed Care – PPO

## 2020-10-10 DIAGNOSIS — E559 Vitamin D deficiency, unspecified: Secondary | ICD-10-CM

## 2020-10-10 DIAGNOSIS — E7800 Pure hypercholesterolemia, unspecified: Secondary | ICD-10-CM

## 2020-10-10 LAB — HEPATIC FUNCTION PANEL
ALT: 19 U/L (ref 0–35)
AST: 26 U/L (ref 0–37)
Albumin: 4.2 g/dL (ref 3.5–5.2)
Alkaline Phosphatase: 82 U/L (ref 39–117)
Bilirubin, Direct: 0.1 mg/dL (ref 0.0–0.3)
Total Bilirubin: 0.3 mg/dL (ref 0.2–1.2)
Total Protein: 7.6 g/dL (ref 6.0–8.3)

## 2020-10-10 LAB — LDL CHOLESTEROL, DIRECT: Direct LDL: 128 mg/dL

## 2020-10-10 LAB — VITAMIN D 25 HYDROXY (VIT D DEFICIENCY, FRACTURES): VITD: 25.82 ng/mL — ABNORMAL LOW (ref 30.00–100.00)

## 2020-10-14 ENCOUNTER — Other Ambulatory Visit: Payer: Self-pay

## 2020-10-16 ENCOUNTER — Encounter: Payer: Self-pay | Admitting: Family Medicine

## 2020-10-16 ENCOUNTER — Telehealth (INDEPENDENT_AMBULATORY_CARE_PROVIDER_SITE_OTHER): Payer: BC Managed Care – PPO | Admitting: Family Medicine

## 2020-10-16 DIAGNOSIS — E039 Hypothyroidism, unspecified: Secondary | ICD-10-CM | POA: Diagnosis not present

## 2020-10-16 DIAGNOSIS — E119 Type 2 diabetes mellitus without complications: Secondary | ICD-10-CM | POA: Diagnosis not present

## 2020-10-16 DIAGNOSIS — E7800 Pure hypercholesterolemia, unspecified: Secondary | ICD-10-CM

## 2020-10-16 DIAGNOSIS — E559 Vitamin D deficiency, unspecified: Secondary | ICD-10-CM

## 2020-10-16 MED ORDER — VITAMIN D (ERGOCALCIFEROL) 1.25 MG (50000 UNIT) PO CAPS: 50000.0000 [IU] | ORAL_CAPSULE | ORAL | 0 refills | Status: DC

## 2020-10-16 NOTE — Assessment & Plan Note (Signed)
Sugars are much better controlled.  She will continue Metformin and Jardiance.  Plan for an A1c at her upcoming visit in March.

## 2020-10-16 NOTE — Assessment & Plan Note (Signed)
Symptomatically improved.  She will continue Synthroid 175 mcg daily.  Plan for labs at follow-up in March.

## 2020-10-16 NOTE — Progress Notes (Signed)
Virtual Visit via video Note  This visit type was conducted due to national recommendations for restrictions regarding the COVID-19 pandemic (e.g. social distancing).  This format is felt to be most appropriate for this patient at this time.  All issues noted in this document were discussed and addressed.  No physical exam was performed (except for noted visual exam findings with Video Visits).   I connected with Tina Moreno today at  9:00 AM EST by a video enabled telemedicine application and verified that I am speaking with the correct person using two identifiers. Location patient: home Location provider: home office Persons participating in the virtual visit: patient, provider  I discussed the limitations, risks, security and privacy concerns of performing an evaluation and management service by telephone and the availability of in person appointments. I also discussed with the patient that there may be a patient responsible charge related to this service. The patient expressed understanding and agreed to proceed.  Reason for visit: f/u  HPI: HYPOTHYROIDISM Disease Monitoring Weight changes: down  Skin Changes: skin is less dry and nails less brittle Heat/Cold intolerance: chronic heat intolerance unchanged  Medication Monitoring Compliance:  Taking synthroid 175 mcg daily   Last TSH:   Lab Results  Component Value Date   TSH 80.59 (H) 08/27/2020   DIABETES Disease Monitoring: Blood Sugar ranges-140s Polyuria/phagia/dipsia- polyuria with starting the jardiance      Optho- UTD Medications: Compliance- taking metformin XR 1000 mg BID, jardiance 10 mg daily Hypoglycemic symptoms-subjectively felt low with sugars of 97 and 122  Hyperlipidemia: The patient did not start on Crestor.  She did not want to start on so many medications at once.  She is hesitant to start on this medication.  Vitamin D deficiency: She was taking ergocalciferol 50,000 units once a week.  ROS: See  pertinent positives and negatives per HPI.  Past Medical History:  Diagnosis Date  . Abnormal Pap smear of cervix   . Anxiety   . Asthma   . Back pain   . Cervical dysplasia   . Depression   . Elevated WBCs    NEGATIVE HEM W/U  . GERD (gastroesophageal reflux disease)   . Hypothyroid     Past Surgical History:  Procedure Laterality Date  . COLONOSCOPY WITH PROPOFOL N/A 10/01/2020   Procedure: COLONOSCOPY WITH PROPOFOL;  Surgeon: Lin Landsman, MD;  Location: [facility] ENDOSCOPY;  Service: Gastroenterology;  Laterality: N/A;  . COLPOSCOPY    . GYNECOLOGIC CRYOSURGERY      Family History  Problem Relation Age of Onset  . Parkinsonism Father   . Diabetes Father   . Hypertension Father   . Cancer Maternal Aunt        SOFT CELL SARCOMA  . Cancer Maternal Uncle        TESTICULAR  . Cancer Paternal Grandmother        COLON  . Cancer Son        TESTICULAR    SOCIAL HX: Non-smoker   Current Outpatient Medications:  .  albuterol (PROVENTIL HFA;VENTOLIN HFA) 108 (90 Base) MCG/ACT inhaler, Inhale 2 puffs into the lungs every 6 (six) hours as needed for wheezing or shortness of breath., Disp: 1 Inhaler, Rfl: 2 .  ALPRAZolam (XANAX) 0.25 MG tablet, Take 1 tablet (0.25 mg total) by mouth daily as needed for anxiety., Disp: 30 tablet, Rfl: 0 .  beclomethasone (QVAR) 80 MCG/ACT inhaler, Inhale into the lungs 2 (two) times daily., Disp: , Rfl:  .  BREO  ELLIPTA 100-25 MCG/INH AEPB, INHALE 1 PUFF BY MOUTH EVERY DAY, Disp: 60 each, Rfl: 0 .  carisoprodol (SOMA) 350 MG tablet, Take 1 tablet (350 mg total) by mouth 3 (three) times daily as needed for muscle spasms., Disp: 30 tablet, Rfl: 0 .  empagliflozin (JARDIANCE) 10 MG TABS tablet, Take 1 tablet (10 mg total) by mouth daily before breakfast., Disp: 30 tablet, Rfl: 2 .  fluticasone (FLONASE) 50 MCG/ACT nasal spray, Place 2 sprays into both nostrils daily as needed for allergies or rhinitis., Disp: 16 g, Rfl: 11 .  ibuprofen  (ADVIL,MOTRIN) 200 MG tablet, Take 200 mg by mouth every 6 (six) hours as needed., Disp: , Rfl:  .  levothyroxine (SYNTHROID) 175 MCG tablet, Take 1 tablet (175 mcg total) by mouth daily before breakfast., Disp: 90 tablet, Rfl: 1 .  metFORMIN (GLUCOPHAGE XR) 500 MG 24 hr tablet, Take 1 tablet (500 mg) PO twice daily with meals for one week, then take 2 tablets with breakfast and 1 tablet with dinner for 1 week, then take 2 tablets twice daily with meals ongoing, Disp: 360 tablet, Rfl: 1 .  rosuvastatin (CRESTOR) 40 MG tablet, Take 1 tablet (40 mg total) by mouth daily., Disp: 90 tablet, Rfl: 3 .  Vitamin D, Ergocalciferol, (DRISDOL) 1.25 MG (50000 UNIT) CAPS capsule, Take 1 capsule (50,000 Units total) by mouth every 7 (seven) days., Disp: 8 capsule, Rfl: 0  EXAM:  VITALS per patient if applicable:  GENERAL: alert, oriented, appears well and in no acute distress  HEENT: atraumatic, conjunttiva clear, no obvious abnormalities on inspection of external nose and ears  NECK: normal movements of the head and neck  LUNGS: on inspection no signs of respiratory distress, breathing rate appears normal, no obvious gross SOB, gasping or wheezing  CV: no obvious cyanosis  MS: moves all visible extremities without noticeable abnormality  PSYCH/NEURO: pleasant and cooperative, no obvious depression or anxiety, speech and thought processing grossly intact  ASSESSMENT AND PLAN:  Discussed the following assessment and plan:  Problem List Items Addressed This Visit    Diabetes ([place])    Sugars are much better controlled.  She will continue Metformin and Jardiance.  Plan for an A1c at her upcoming visit in March.      Relevant Orders   Hemoglobin A1c   Elevated LDL cholesterol level    Discussed that she is at increased risk for stroke and heart attack given her LDL level as well as her history of diabetes.  Advised to start on a statin to help reduce this risk though she declines at this time.  She  will let us know if she changes her mind.      Hypothyroid    Symptomatically improved.  She will continue Synthroid 175 mcg daily.  Plan for labs at follow-up in March.      Relevant Orders   TSH   Vitamin D deficiency    We will give 1 more course of ergocalciferol 50,000 units once weekly.  Plan to check labs follow-up in March.      Relevant Medications   Vitamin D, Ergocalciferol, (DRISDOL) 1.25 MG (50000 UNIT) CAPS capsule   Other Relevant Orders   Vitamin D (25 hydroxy)       I discussed the assessment and treatment plan with the patient. The patient was provided an opportunity to ask questions and all were answered. The patient agreed with the plan and demonstrated an understanding of the instructions.   The patient was advised  to call back or seek an in-person evaluation if the symptoms worsen or if the condition fails to improve as anticipated.   Tommi Rumps, MD

## 2020-10-16 NOTE — Assessment & Plan Note (Signed)
Discussed that she is at increased risk for stroke and heart attack given her LDL level as well as her history of diabetes.  Advised to start on a statin to help reduce this risk though she declines at this time.  She will let us know if she changes her mind.

## 2020-10-16 NOTE — Assessment & Plan Note (Signed)
We will give 1 more course of ergocalciferol 50,000 units once weekly.  Plan to check labs follow-up in March.

## 2020-10-17 ENCOUNTER — Telehealth: Payer: Self-pay | Admitting: Family Medicine

## 2020-10-17 NOTE — Telephone Encounter (Signed)
Patient need to schedule a 6 week lab appt

## 2020-11-27 ENCOUNTER — Ambulatory Visit: Payer: BC Managed Care – PPO | Admitting: Family Medicine

## 2020-11-27 ENCOUNTER — Other Ambulatory Visit: Payer: Self-pay

## 2020-11-27 ENCOUNTER — Telehealth: Payer: Self-pay

## 2020-11-27 ENCOUNTER — Encounter: Payer: Self-pay | Admitting: Family Medicine

## 2020-11-27 DIAGNOSIS — F419 Anxiety disorder, unspecified: Secondary | ICD-10-CM

## 2020-11-27 DIAGNOSIS — N950 Postmenopausal bleeding: Secondary | ICD-10-CM | POA: Insufficient documentation

## 2020-11-27 DIAGNOSIS — E039 Hypothyroidism, unspecified: Secondary | ICD-10-CM

## 2020-11-27 DIAGNOSIS — R059 Cough, unspecified: Secondary | ICD-10-CM

## 2020-11-27 DIAGNOSIS — E559 Vitamin D deficiency, unspecified: Secondary | ICD-10-CM

## 2020-11-27 DIAGNOSIS — E119 Type 2 diabetes mellitus without complications: Secondary | ICD-10-CM

## 2020-11-27 DIAGNOSIS — F32A Depression, unspecified: Secondary | ICD-10-CM

## 2020-11-27 NOTE — Progress Notes (Signed)
Tommi Rumps, MD Phone: [phone]  Tina Moreno is a 54 y.o. female who presents today for f/u.  HYPOTHYROIDISM Disease Monitoring Weight changes: no  Skin Changes: notes improvement in skin Heat/Cold intolerance: cold intolerance  Medication Monitoring Compliance:  Taking synthroid 175 mcg daily   Last TSH:   Lab Results  Component Value Date   TSH 80.59 (H) 08/27/2020   DIABETES Disease Monitoring: Blood Sugar ranges-140-150, though over the past several weeks has been in the 100s Polyuria/phagia/dipsia- no      Optho- UTD, saw last May Medications: Compliance- taking metformin, just restarted jardiance Hypoglycemic symptoms- subjective lows when in the 100s  Postmenopausal bleeding: Patient went a year without a menstrual cycle.  Started bleeding 7 days ago.  Has minimal spotting now.  She played phone tag with GYN previously when referral was placed for Pap smear.  Depression: This continues to be an issue.  She has no anxiety.  No SI.  She lost the number for the psychologist.  Cough: After completion of the physical exam portion of the visit the patient wondered how her lungs sounded.  When I advised that her lungs sounded normal she stated that was interesting.  I asked why this was an interesting finding and she noted she had been having a cough and dyspnea over the last couple of days. Has some shortness of breath with exertion and rest.  She felt this was related to the release of pollen as she does have asthma and allergy issues.  No fevers.  No loss of taste or smell.  No COVID-19 exposures.  She has been vaccinated against COVID-19.  She has been using her Breo inhaler though has not been using her albuterol.  Discussed our protocols when somebody has respiratory symptoms and the patient was advised that we would be unable to complete her labs here today.  I did discuss that we could have [place] [facility] mall that the patient deferred that.    Social  History   Tobacco Use  Smoking Status Never Smoker  Smokeless Tobacco Never Used    Current Outpatient Medications on File Prior to Visit  Medication Sig Dispense Refill  . albuterol (PROVENTIL HFA;VENTOLIN HFA) 108 (90 Base) MCG/ACT inhaler Inhale 2 puffs into the lungs every 6 (six) hours as needed for wheezing or shortness of breath. 1 Inhaler 2  . ALPRAZolam (XANAX) 0.25 MG tablet Take 1 tablet (0.25 mg total) by mouth daily as needed for anxiety. 30 tablet 0  . beclomethasone (QVAR) 80 MCG/ACT inhaler Inhale into the lungs 2 (two) times daily.    Marland Kitchen BREO ELLIPTA 100-25 MCG/INH AEPB INHALE 1 PUFF BY MOUTH EVERY DAY 60 each 0  . carisoprodol (SOMA) 350 MG tablet Take 1 tablet (350 mg total) by mouth 3 (three) times daily as needed for muscle spasms. 30 tablet 0  . empagliflozin (JARDIANCE) 10 MG TABS tablet Take 1 tablet (10 mg total) by mouth daily before breakfast. 30 tablet 2  . fluticasone (FLONASE) 50 MCG/ACT nasal spray Place 2 sprays into both nostrils daily as needed for allergies or rhinitis. 16 g 11  . ibuprofen (ADVIL,MOTRIN) 200 MG tablet Take 200 mg by mouth every 6 (six) hours as needed.    Marland Kitchen levothyroxine (SYNTHROID) 175 MCG tablet Take 1 tablet (175 mcg total) by mouth daily before breakfast. 90 tablet 1  . metFORMIN (GLUCOPHAGE XR) 500 MG 24 hr tablet Take 1 tablet (500 mg) PO twice daily with meals for one  week, then take 2 tablets with breakfast and 1 tablet with dinner for 1 week, then take 2 tablets twice daily with meals ongoing 360 tablet 1  . rosuvastatin (CRESTOR) 40 MG tablet Take 1 tablet (40 mg total) by mouth daily. 90 tablet 3  . Vitamin D, Ergocalciferol, (DRISDOL) 1.25 MG (50000 UNIT) CAPS capsule Take 1 capsule (50,000 Units total) by mouth every 7 (seven) days. 8 capsule 0   No current facility-administered medications on file prior to visit.     ROS see history of present illness  Objective  Physical Exam Vitals:   11/27/20 0906  BP: 90/60   Pulse: 81  Temp: 98.8 F (37.1 C)  SpO2: 97%    BP Readings from Last 3 Encounters:  11/27/20 90/60  10/01/20 116/70  08/27/20 118/70   Wt Readings from Last 3 Encounters:  11/27/20 227 lb 3.2 oz (103.1 kg)  10/16/20 224 lb (101.6 kg)  10/01/20 226 lb 7.3 oz (102.7 kg)    Physical Exam Constitutional:      General: She is not in acute distress.    Appearance: She is not diaphoretic.  Cardiovascular:     Rate and Rhythm: Normal rate and regular rhythm.     Heart sounds: Normal heart sounds.  Pulmonary:     Effort: Pulmonary effort is normal.     Breath sounds: Normal breath sounds.  Musculoskeletal:        General: No edema.     Right lower leg: No edema.     Left lower leg: No edema.  Skin:    General: Skin is warm and dry.  Neurological:     Mental Status: She is alert.      Assessment/Plan: Please see individual problem list.  Problem List Items Addressed This Visit    Anxiety and depression    This continues to be an issue.  She was provided with the number for a psychologist locally.  She was advised to seek medical attention in the ED if she develops suicidal ideation.      Cough    Likely related to the patient's history of reactive airway disease and following release from trees though cannot rule out COVID-19 without a test.  She is agreeable to testing.  Discussed that she is to stay quarantined at home until we contact her with the results.  Advised to seek medical attention for worsening breathing issues, chest pain, or elevated fevers.  Discussed use of albuterol on an as-needed basis for her symptoms.      Relevant Orders   Novel Coronavirus, NAA ([facility])   Diabetes ([place])    She will continue Metformin 1000 mg twice daily and Jardiance 10 mg once daily for this.  She will return in 2 weeks for labs.      Hypothyroid    Continue Synthroid 175 MCG daily.  She needs follow-up lab work note given her respiratory symptoms we cannot complete this  today.  She will return in a little over 2 weeks to have this completed.      Postmenopausal bleeding    Discussed referral to GYN.  Discussed most worrisome cause would be a cancerous cause and that is the reason she needs to see GYN.  Discussed there are other benign causes.  Referral placed.      Relevant Orders   Ambulatory referral to Gynecology   Vitamin D deficiency    Please check vitamin D.         This visit  occurred during the SARS-CoV-2 public health emergency.  Safety protocols were in place, including screening questions prior to the visit, additional usage of staff PPE, and extensive cleaning of exam room while observing appropriate contact time as indicated for disinfecting solutions.    Tommi Rumps, MD [place]

## 2020-11-27 NOTE — Assessment & Plan Note (Signed)
Please check vitamin D.

## 2020-11-27 NOTE — Assessment & Plan Note (Signed)
Continue Synthroid 175 MCG daily.  She needs follow-up lab work note given her respiratory symptoms we cannot complete this today.  She will return in a little over 2 weeks to have this completed.

## 2020-11-27 NOTE — Patient Instructions (Signed)
Nice to see you. We will contact you with your COVID-19 test result. Please stay quarantined at home at least until we contact you with the result.  This means do not leave your house unless she have to seek medical attention.   We will get you referred to GYN for your postmenopausal bleeding. Please contact the therapist as we discussed.

## 2020-11-27 NOTE — Assessment & Plan Note (Signed)
Likely related to the patient's history of reactive airway disease and following release from trees though cannot rule out COVID-19 without a test.  She is agreeable to testing.  Discussed that she is to stay quarantined at home until we contact her with the results.  Advised to seek medical attention for worsening breathing issues, chest pain, or elevated fevers.  Discussed use of albuterol on an as-needed basis for her symptoms.

## 2020-11-27 NOTE — Assessment & Plan Note (Signed)
She will continue Metformin 1000 mg twice daily and Jardiance 10 mg once daily for this.  She will return in 2 weeks for labs.

## 2020-11-27 NOTE — Assessment & Plan Note (Signed)
Discussed referral to GYN.  Discussed most worrisome cause would be a cancerous cause and that is the reason she needs to see GYN.  Discussed there are other benign causes.  Referral placed.

## 2020-11-27 NOTE — Telephone Encounter (Signed)
-----   Message from Leone Haven, MD sent at 11/27/2020  9:51 AM EST ----- Please call the patient and get her scheduled for labs more than 14 days from today.  She needs to be scheduled for follow-up with me as well 3 months after her lab work.

## 2020-11-27 NOTE — Assessment & Plan Note (Signed)
This continues to be an issue.  She was provided with the number for a psychologist locally.  She was advised to seek medical attention in the ED if she develops suicidal ideation.

## 2020-11-27 NOTE — Telephone Encounter (Signed)
I called and schedule the patient for labs in 14 days and a 3 month f/up with the provider per the provider.  Britten Seyfried,cma

## 2020-11-28 LAB — SARS-COV-2, NAA 2 DAY TAT

## 2020-11-28 LAB — NOVEL CORONAVIRUS, NAA: SARS-CoV-2, NAA: NOT DETECTED

## 2020-12-11 ENCOUNTER — Other Ambulatory Visit (INDEPENDENT_AMBULATORY_CARE_PROVIDER_SITE_OTHER): Payer: BC Managed Care – PPO

## 2020-12-11 ENCOUNTER — Other Ambulatory Visit: Payer: Self-pay

## 2020-12-11 DIAGNOSIS — E119 Type 2 diabetes mellitus without complications: Secondary | ICD-10-CM

## 2020-12-11 DIAGNOSIS — E039 Hypothyroidism, unspecified: Secondary | ICD-10-CM | POA: Diagnosis not present

## 2020-12-11 DIAGNOSIS — E559 Vitamin D deficiency, unspecified: Secondary | ICD-10-CM

## 2020-12-11 LAB — TSH: TSH: 0.62 u[IU]/mL (ref 0.35–4.50)

## 2020-12-11 LAB — HEMOGLOBIN A1C: Hgb A1c MFr Bld: 7.7 % — ABNORMAL HIGH (ref 4.6–6.5)

## 2020-12-11 LAB — VITAMIN D 25 HYDROXY (VIT D DEFICIENCY, FRACTURES): VITD: 28.66 ng/mL — ABNORMAL LOW (ref 30.00–100.00)

## 2020-12-17 ENCOUNTER — Telehealth: Payer: Self-pay

## 2020-12-17 DIAGNOSIS — E039 Hypothyroidism, unspecified: Secondary | ICD-10-CM

## 2020-12-17 DIAGNOSIS — E119 Type 2 diabetes mellitus without complications: Secondary | ICD-10-CM

## 2020-12-17 NOTE — Telephone Encounter (Signed)
-----   Message from Leone Haven, MD sent at 12/16/2020 12:27 PM EDT ----- Please let the patient know her vitamin D remains mildly low.  Please see if she is taking any over-the-counter vitamin D at this time.  Please also see when she last took the prescription vitamin D supplement.  Her TSH is acceptable.  I would suggest rechecking this again in about 6 weeks given that she recently restarted on medication.  Her A1c is uncontrolled though is improved from a few months ago.  I would suggest we increase the Jardiance to 25 mg once daily.

## 2020-12-18 NOTE — Telephone Encounter (Signed)
Referral placed.

## 2020-12-18 NOTE — Telephone Encounter (Signed)
Noted.  Would she be willing to see the clinical pharmacist to see if she meets criteria for patient assistance for the Jardiance?  She needs an additional medication for her diabetes and most of the other options are going to be fairly expensive if not covered by her insurance.

## 2020-12-18 NOTE — Telephone Encounter (Signed)
Patient stated she is willing to see the clinical pharmacist about the [place].  Felicidad Sugarman,cma

## 2020-12-19 ENCOUNTER — Telehealth: Payer: Self-pay | Admitting: *Deleted

## 2020-12-19 NOTE — Chronic Care Management (AMB) (Signed)
  Care Management   Outreach Note  12/19/2020 Name: Tina Moreno MRN: [id number] DOB: [birth year]  Referred by: Leone Haven, MD Reason for referral : Care Coordination (Initial outreach to schedule referral with Pharm D)   An unsuccessful telephone outreach was attempted today. The patient was referred to the case management team for assistance with care management and care coordination.   Follow Up Plan: A HIPAA compliant phone message was left for the patient providing contact information and requesting a return call.  The care management team will reach out to the patient again over the next 7 days.  If patient returns call to provider office, please advise to call [place] Lysle Morales at [facility]

## 2020-12-24 NOTE — Chronic Care Management (AMB) (Signed)
  Care Management   Note  12/24/2020 Name: EDRA RICCARDI MRN: [id number] DOB: 04-30-1966  [name] is a 55 y.o. year old female who is a primary care patient of Caryl Bis, Angela Adam, MD. I reached out to [place] by phone today in response to a referral sent by Ms. Taylie D PowersPCP,[place], Angela Adam, MD.    Ms. Rajewski was given information about care management services today including:  1. Care management services include personalized support from designated clinical staff supervised by her physician, including individualized plan of care and coordination with other care providers 2. 24/7 contact phone numbers for assistance for urgent and routine care needs. 3. The patient may stop care management services at any time by phone call to the office staff.  Patient agreed to services and verbal consent obtained.   Follow up plan: Telephone appointment with care management team member scheduled for: 01/20/2021  [facility]

## 2021-01-09 ENCOUNTER — Encounter: Payer: Self-pay | Admitting: Obstetrics and Gynecology

## 2021-01-09 ENCOUNTER — Ambulatory Visit: Payer: BC Managed Care – PPO | Admitting: Obstetrics and Gynecology

## 2021-01-09 ENCOUNTER — Other Ambulatory Visit: Payer: Self-pay

## 2021-01-09 VITALS — BP 130/90 | HR 83 | Ht 60.75 in | Wt 225.3 lb

## 2021-01-09 DIAGNOSIS — N950 Postmenopausal bleeding: Secondary | ICD-10-CM

## 2021-01-09 NOTE — Progress Notes (Signed)
HPI:      [name] is a 55 y.o. G2P1001 who LMP was Patient's last menstrual period was 11/20/2020.  Subjective:   She presents today stating that its been more than 1 year since she had a menstrual period.  Prior to that she was on the birth control patch for cycle regulation.  Her cycles became irregular on the patch and so she stopped using the patch and has not had a menstrual period since that time.  The exception is that last week she began bleeding heavily and had a weeklong menses.    Hx: The following portions of the patient's history were reviewed and updated as appropriate:             She  has a past medical history of Abnormal Pap smear of cervix, Anxiety, Asthma, Back pain, Cervical dysplasia, Depression, Elevated WBCs, GERD (gastroesophageal reflux disease), and Hypothyroid. She does not have any pertinent problems on file. She  has a past surgical history that includes Colposcopy; Gynecologic cryosurgery; and Colonoscopy with propofol (N/A, 10/01/2020). Her family history includes Cancer in her maternal aunt, maternal uncle, paternal grandmother, and son; Diabetes in her father; Hypertension in her father; Parkinsonism in her father. She  reports that she has never smoked. She has never used smokeless tobacco. She reports current alcohol use. She reports that she does not use drugs. She has a current medication list which includes the following prescription(s): albuterol, alprazolam, beclomethasone, breo ellipta, carisoprodol, fluticasone, ibuprofen, levothyroxine, metformin, vitamin d (ergocalciferol), and rosuvastatin. She is allergic to amoxicillin and other.       Review of Systems:  Review of Systems  Constitutional: Denied constitutional symptoms, night sweats, recent illness, fatigue, fever, insomnia and weight loss.  Eyes: Denied eye symptoms, eye pain, photophobia, vision change and visual disturbance.  Ears/Nose/Throat/Neck: Denied ear, nose, throat or neck  symptoms, hearing loss, nasal discharge, sinus congestion and sore throat.  Cardiovascular: Denied cardiovascular symptoms, arrhythmia, chest pain/pressure, edema, exercise intolerance, orthopnea and palpitations.  Respiratory: Denied pulmonary symptoms, asthma, pleuritic pain, productive sputum, cough, dyspnea and wheezing.  Gastrointestinal: Denied, gastro-esophageal reflux, melena, nausea and vomiting.  Genitourinary: See HPI for additional information.  Musculoskeletal: Denied musculoskeletal symptoms, stiffness, swelling, muscle weakness and myalgia.  Dermatologic: Denied dermatology symptoms, rash and scar.  Neurologic: Denied neurology symptoms, dizziness, headache, neck pain and syncope.  Psychiatric: Denied psychiatric symptoms, anxiety and depression.  Endocrine: Denied endocrine symptoms including hot flashes and night sweats.   Meds:   Current Outpatient Medications on File Prior to Visit  Medication Sig Dispense Refill  . albuterol (PROVENTIL HFA;VENTOLIN HFA) 108 (90 Base) MCG/ACT inhaler Inhale 2 puffs into the lungs every 6 (six) hours as needed for wheezing or shortness of breath. 1 Inhaler 2  . ALPRAZolam (XANAX) 0.25 MG tablet Take 1 tablet (0.25 mg total) by mouth daily as needed for anxiety. 30 tablet 0  . beclomethasone (QVAR) 80 MCG/ACT inhaler Inhale into the lungs 2 (two) times daily.    Marland Kitchen BREO ELLIPTA 100-25 MCG/INH AEPB INHALE 1 PUFF BY MOUTH EVERY DAY 60 each 0  . carisoprodol (SOMA) 350 MG tablet Take 1 tablet (350 mg total) by mouth 3 (three) times daily as needed for muscle spasms. 30 tablet 0  . fluticasone (FLONASE) 50 MCG/ACT nasal spray Place 2 sprays into both nostrils daily as needed for allergies or rhinitis. 16 g 11  . ibuprofen (ADVIL,MOTRIN) 200 MG tablet Take 200 mg by mouth every 6 (six) hours as needed.    Marland Kitchen  levothyroxine (SYNTHROID) 175 MCG tablet Take 1 tablet (175 mcg total) by mouth daily before breakfast. 90 tablet 1  . metFORMIN (GLUCOPHAGE XR)  500 MG 24 hr tablet Take 1 tablet (500 mg) PO twice daily with meals for one week, then take 2 tablets with breakfast and 1 tablet with dinner for 1 week, then take 2 tablets twice daily with meals ongoing 360 tablet 1  . Vitamin D, Ergocalciferol, (DRISDOL) 1.25 MG (50000 UNIT) CAPS capsule Take 1 capsule (50,000 Units total) by mouth every 7 (seven) days. 8 capsule 0  . rosuvastatin (CRESTOR) 40 MG tablet Take 1 tablet (40 mg total) by mouth daily. (Patient not taking: Reported on 01/09/2021) 90 tablet 3   No current facility-administered medications on file prior to visit.          Objective:     Vitals:   01/09/21 1100  BP: 130/90  Pulse: 83   Filed Weights   01/09/21 1100  Weight: 225 lb 4.8 oz (102.2 kg)                Assessment:    G2P1001 Patient Active Problem List   Diagnosis Date Noted  . Postmenopausal bleeding 11/27/2020  . Encounter for screening colonoscopy   . Thrombocytosis 01/25/2019  . Chronic rhinitis 01/02/2019  . Cough 01/02/2019  . Itchy eyes 01/02/2019  . Neutrophilia 11/30/2018  . Leg swelling 11/14/2018  . Palpitations 08/26/2018  . DOE (dyspnea on exertion) 08/26/2018  . Left wrist pain 04/12/2017  . Anxiety and depression 10/06/2016  . Headache 07/06/2016  . Allergic rhinitis 07/06/2016  . Reactive airway disease 03/30/2016  . Diabetes ([place]) 12/13/2015  . Vitamin D deficiency 12/13/2015  . Elevated LDL cholesterol level 12/13/2015  . Back pain   . Hypothyroid 01/11/2012  . Morbid obesity ([place]) 01/11/2012     1. Postmenopausal bleeding        Plan:            1.  FSH to prove patient is menopausal.  2.   Ultrasound for endometrial thickness   Endometrial biopsy if necessary after ultrasound.  I discussed hyperplasia, atypical hyperplasia, endometrial biopsy and management of the above conditions in some detail.  All questions answered. Orders Orders Placed This Encounter  Procedures  . US PELVIS TRANSVAGINAL NON-OB (TV  ONLY)  . US PELVIS (TRANSABDOMINAL ONLY)  . Follicle stimulating hormone    No orders of the defined types were placed in this encounter.     F/U  Return for We will contact her with any abnormal test results. I spent 31 minutes involved in the care of this patient preparing to see the patient by obtaining and reviewing her medical history (including labs, imaging tests and prior procedures), documenting clinical information in the electronic health record (EHR), counseling and coordinating care plans, writing and sending prescriptions, ordering tests or procedures and directly communicating with the patient by discussing pertinent items from her history and physical exam as well as detailing my assessment and plan as noted above so that she has an informed understanding.  All of her questions were answered.  Finis Bud, M.D. 01/09/2021 11:33 AM

## 2021-01-10 LAB — FOLLICLE STIMULATING HORMONE: FSH: 34.3 m[IU]/mL

## 2021-01-20 ENCOUNTER — Ambulatory Visit: Payer: BC Managed Care – PPO | Admitting: Pharmacist

## 2021-01-20 DIAGNOSIS — E119 Type 2 diabetes mellitus without complications: Secondary | ICD-10-CM

## 2021-01-20 DIAGNOSIS — E7800 Pure hypercholesterolemia, unspecified: Secondary | ICD-10-CM

## 2021-01-20 DIAGNOSIS — E039 Hypothyroidism, unspecified: Secondary | ICD-10-CM

## 2021-01-20 DIAGNOSIS — F32A Depression, unspecified: Secondary | ICD-10-CM

## 2021-01-20 NOTE — Patient Instructions (Signed)
  Visit Information  PATIENT GOALS:  Goals Addressed              This Visit's Progress   .  Medication Management (pt-stated)        Patient Goals/Self-Care Activities . Over the next 90 days, patient will:  - take medications as prescribed check glucose BID, document, and provide at future appointments collaborate with provider on medication access solutions       Tina Moreno was given information about Care Management services by the embedded care coordination team including:  1. Care Management services include personalized support from designated clinical staff supervised by her physician, including individualized plan of care and coordination with other care providers 2. 24/7 contact phone numbers for assistance for urgent and routine care needs. 3. The patient may stop CCM services at any time (effective at the end of the month) by phone call to the office staff.  Patient agreed to services and verbal consent obtained.   Patient verbalizes understanding of instructions provided today and agrees to view in [place].   Telephone follow up appointment with care management team member scheduled for: ~6 weeks  Lorel Monaco, PharmD, [place]

## 2021-01-20 NOTE — Chronic Care Management (AMB) (Addendum)
Care Management   Pharmacy Note  01/20/2021 Name: Tina Moreno MRN: [id number] DOB: [birth year]  Subjective: Tina Moreno is a 55 y.o. year old female who is a primary care patient of Caryl Bis, Angela Adam, MD. The Care Management team was consulted for assistance with care management and care coordination needs.    Engaged with patient by telephone for initial visit in response to provider referral for pharmacy case management and/or care coordination services.   The patient was given information about Care Management services today including:  1. Care Management services includes personalized support from designated clinical staff supervised by the patient's primary care provider, including individualized plan of care and coordination with other care providers. 2. 24/7 contact phone numbers for assistance for urgent and routine care needs. 3. The patient may stop case management services at any time by phone call to the office staff.  Patient agreed to services and consent obtained.  Assessment:  Review of patient status, including review of consultants reports, laboratory and other test data, was performed as part of comprehensive evaluation and provision of chronic care management services.   SDOH (Social Determinants of Health) assessments and interventions performed:  SDOH Interventions   Flowsheet Row Most Recent Value  SDOH Interventions   Financial Strain Interventions Other (Comment)  [Copay assistance]       Objective:  Lab Results  Component Value Date   CREATININE 0.70 08/27/2020   CREATININE 0.64 11/30/2018   CREATININE 0.58 08/26/2018    Lab Results  Component Value Date   HGBA1C 7.7 (H) 12/11/2020       Component Value Date/Time   CHOL 234 (H) 08/27/2020 0937   CHOL 207 (H) 12/11/2016 0956   TRIG 217.0 (H) 08/27/2020 0937   HDL 39.10 08/27/2020 0937   HDL 54 12/11/2016 0956   CHOLHDL 6 08/27/2020 0937   VLDL 43.4 (H) 08/27/2020 0937   LDLCALC 99  04/12/2017 0823   LDLCALC 124 (H) 12/11/2016 0956   LDLDIRECT 128.0 10/10/2020 0916   Clinical ASCVD: No  The 10-year ASCVD risk score Mikey Bussing DC Jr., et al., 2013) is: 5.2%   Values used to calculate the score:     Age: 71 years     Sex: Female     Is Non-Hispanic African American: No     Diabetic: Yes     Tobacco smoker: No     Systolic Blood Pressure: 545 mmHg     Is BP treated: No     HDL Cholesterol: 39.1 mg/dL     Total Cholesterol: 234 mg/dL     BP Readings from Last 3 Encounters:  01/09/21 130/90  11/27/20 90/60  10/01/20 116/70    Care Plan  Allergies  Allergen Reactions  . Amoxicillin Other (See Comments)    Dizziness  . Other     TREES    Medications Reviewed Today    Reviewed by Avie Arenas, RPH (Pharmacist) on 01/20/21 at 1354  Med List Status: <None>  Medication Order Taking? Sig Documenting Provider Last Dose Status Informant  albuterol (PROVENTIL HFA;VENTOLIN HFA) 108 (90 Base) MCG/ACT inhaler [id number] Yes Inhale 2 puffs into the lungs every 6 (six) hours as needed for wheezing or shortness of breath. McLean-Scocuzza, Nino Glow, MD Taking Active Self  ALPRAZolam Duanne Moron) 0.25 MG tablet [id number] No Take 1 tablet (0.25 mg total) by mouth daily as needed for anxiety.  Patient not taking: Reported on 01/20/2021   Leone Haven, MD Not Taking Active   beclomethasone (QVAR)  80 MCG/ACT inhaler [id number] No Inhale into the lungs 2 (two) times daily.  Patient not taking: Reported on 01/20/2021   [provider] Not Taking Active Self           Med Note Enos Fling Aug 07, 2019  3:37 PM) Using only if she is ill  [place] Patter 100-25 MCG/INH AEPB [id number] Yes INHALE 1 PUFF BY MOUTH EVERY DAY Garnet Sierras, DO Taking Active Self  carisoprodol (SOMA) 350 MG tablet [id number] Yes Take 1 tablet (350 mg total) by mouth 3 (three) times daily as needed for muscle spasms. Leone Haven, MD Taking Active   fluticasone [facility]) 50 MCG/ACT nasal spray  [id number] Yes Place 2 sprays into both nostrils daily as needed for allergies or rhinitis. McLean-Scocuzza, Nino Glow, MD Taking Active Self           Med Note Enos Fling Aug 07, 2019  3:37 PM) Using prn  ibuprofen (ADVIL,MOTRIN) 200 MG tablet [id number] Yes Take 200 mg by mouth every 6 (six) hours as needed. [provider] Taking Active Self  levothyroxine (SYNTHROID) 175 MCG tablet [id number] Yes Take 1 tablet (175 mcg total) by mouth daily before breakfast. Leone Haven, MD Taking Active Self  metFORMIN (GLUCOPHAGE XR) 500 MG 24 hr tablet [id number] Yes Take 1 tablet (500 mg) PO twice daily with meals for one week, then take 2 tablets with breakfast and 1 tablet with dinner for 1 week, then take 2 tablets twice daily with meals ongoing Leone Haven, MD Taking Active            Med Note (Rigby Leonhardt A   Mon Jan 20, 2021  1:49 PM) 2 tablet daily  rosuvastatin (CRESTOR) 40 MG tablet [id number] No Take 1 tablet (40 mg total) by mouth daily.  Patient not taking: No sig reported   Leone Haven, MD Not Taking Active   Vitamin D, Ergocalciferol, (DRISDOL) 1.25 MG (50000 UNIT) CAPS capsule [id number] Yes Take 1 capsule (50,000 Units total) by mouth every 7 (seven) days. Leone Haven, MD Taking Active           Patient Active Problem List   Diagnosis Date Noted  . Postmenopausal bleeding 11/27/2020  . Encounter for screening colonoscopy   . Thrombocytosis 01/25/2019  . Chronic rhinitis 01/02/2019  . Cough 01/02/2019  . Itchy eyes 01/02/2019  . Neutrophilia 11/30/2018  . Leg swelling 11/14/2018  . Palpitations 08/26/2018  . DOE (dyspnea on exertion) 08/26/2018  . Left wrist pain 04/12/2017  . Anxiety and depression 10/06/2016  . Headache 07/06/2016  . Allergic rhinitis 07/06/2016  . Reactive airway disease 03/30/2016  . Diabetes ([place]) 12/13/2015  . Vitamin D deficiency 12/13/2015  . Elevated LDL cholesterol level 12/13/2015  . Back pain   .  Hypothyroid 01/11/2012  . Morbid obesity ([place]) 01/11/2012    Conditions to be addressed/monitored: HLD, DMII, Anxiety and Depression  Care Plan : Medication Management  Updates made by Ashlinn Hemrick A, RPH since 01/20/2021 12:00 AM    Problem: T2DM, obesity, HLD, hypothyroidism, anxiety/depression, allergic rhinitis   Priority: High  Onset Date: 01/20/2021    Long-Range Goal: Disease Progression Prevention   Start Date: 01/20/2021  This Visit's Progress: On track  Priority: High  Note:   Current Barriers:  . Unable to independently afford treatment regimen . Unable to achieve control of diabetes   Pharmacist Clinical Goal(s):  Marland Kitchen Over the next 90 days,  patient will verbalize ability to afford treatment regimen . achieve adherence to monitoring guidelines and medication adherence to achieve therapeutic efficacy . achieve control of diabetes as evidenced by A1c  through collaboration with PharmD and provider.   Interventions: . 1:1 collaboration with Leone Haven, MD regarding development and update of comprehensive plan of care as evidenced by provider attestation and co-signature . Inter-disciplinary care team collaboration (see longitudinal plan of care) . Comprehensive medication review performed; medication list updated in electronic medical record  Diabetes: . Uncontrolled; current treatment: metformin 1000 mg BID o Reports medication non-adherence - takes evening dose but often misses lunch dose  o PCP increased Jardiance from 10 mg to 25 mg daily on 3/23, however pt never started new dose due to unaffordable copay of $387. Of note, confirmed with [facility] 10 mg had an initial $10 copay in January 2022 before cost significantly increased  . Current glucose readings: reports not really checking unless feeling bad, which has been occurring for the last week - BG 180, 200s . Denies polyuria, polydipsia, blurry vision, and neuropathy  . Denies hypoglycemic symptoms . Advised  patient to contact [employer] insurance to determine reason why Jardiance copay cost significantly increased ($10>>$387) . Educated on the importance of medication adherence . Recommended to start taking 3 tablets of metformin in the evening with dinner to minimize GI upset. If tolerated, recommended increasing to 4 tablets daily qPM with dinner. Patient verbalized understanding.  Hyperlipidemia: . Uncontrolled; current treatment: rosuvastatin 40 mg daily (not taking)  o Reported never starting rosuvastatin because she was started on too many medications at one time . Discussed clinical benefits of statins (cardiovascular protection, lipid-lowering) and potential side-effects.  . Will plan to revisit starting rosuvastatin at next appointment  Hypothyroidism . Controlled - TSH wnl; current treatment: levothyroxine 175 mg daily . Recommended to continue current regimen at this time  Allergic Rhinitis . Uncontrolled per patient; current treatment: albuterol 108 mcg/act PRN, beclomethasone (QVAR) 80 mcg/ACT - 2 puffs BID (taking PRN), Breo Ellipta 100-25 mcg - 1 puff daily, fluticasone 50 mcg/act PRN . Reports allergy test came back negative, but still experience allergy symptoms and has been using albuterol more frequently . Recommended to continue current regimen at this time  Anxiety/Depression . Controlled per patient; current treatment: Alprazolam 0.25 mg daily PRN . Recommended to continue current regimen  Back Pain . Controlled per patient; current treatment: carisoprodol 350 mg TID PRN, ibuprofen 200 mg every 6 hours PRN . Recommended to continue current regimen at this time  Supplementation . Current treatment: vitamin D 50,000 every 7 days  Patient Goals/Self-Care Activities . Over the next 90 days, patient will:  - take medications as prescribed check glucose BID, document, and provide at future appointments collaborate with provider on medication access solutions  Follow Up  Plan: Telephone follow up appointment with care management team member scheduled for: ~6 weeks      Medication Assistance:  Deductible required for Jardiance - TBD  Follow Up:  Patient agrees to Care Plan and Follow-up.  Plan: Telephone follow up appointment with care management team member scheduled for:  ~6 weeks  Lorel Monaco, PharmD, BCPS PGY2 [place]   I was present for this visit and agree with the documentation by the resident as above.   Catie Darnelle Maffucci, PharmD, [place], [place] Clinical Pharmacist [employer] at [place]

## 2021-01-28 ENCOUNTER — Other Ambulatory Visit (INDEPENDENT_AMBULATORY_CARE_PROVIDER_SITE_OTHER): Payer: BC Managed Care – PPO

## 2021-01-28 ENCOUNTER — Other Ambulatory Visit: Payer: Self-pay

## 2021-01-28 DIAGNOSIS — E039 Hypothyroidism, unspecified: Secondary | ICD-10-CM

## 2021-01-28 LAB — TSH: TSH: 0.02 u[IU]/mL — ABNORMAL LOW (ref 0.35–4.50)

## 2021-02-05 ENCOUNTER — Ambulatory Visit: Payer: BC Managed Care – PPO

## 2021-02-05 ENCOUNTER — Ambulatory Visit
Admission: RE | Admit: 2021-02-05 | Discharge: 2021-02-05 | Disposition: A | Discharge status: Home / Self Care | Payer: BC Managed Care – PPO | Source: Ambulatory Visit | Attending: Obstetrics and Gynecology | Admitting: Obstetrics and Gynecology

## 2021-02-05 ENCOUNTER — Other Ambulatory Visit: Payer: Self-pay

## 2021-02-05 ENCOUNTER — Telehealth: Payer: Self-pay

## 2021-02-05 DIAGNOSIS — N950 Postmenopausal bleeding: Secondary | ICD-10-CM | POA: Diagnosis not present

## 2021-02-05 DIAGNOSIS — D259 Leiomyoma of uterus, unspecified: Secondary | ICD-10-CM | POA: Diagnosis not present

## 2021-02-05 NOTE — Telephone Encounter (Signed)
She does not need this refilled at this time. She is to only be on metformin for her diabetes.

## 2021-02-07 ENCOUNTER — Other Ambulatory Visit: Payer: Self-pay

## 2021-02-07 ENCOUNTER — Ambulatory Visit: Payer: BC Managed Care – PPO | Admitting: Pharmacist

## 2021-02-07 DIAGNOSIS — E119 Type 2 diabetes mellitus without complications: Secondary | ICD-10-CM

## 2021-02-07 MED ORDER — EMPAGLIFLOZIN 10 MG PO TABS: 10.0000 mg | ORAL_TABLET | Freq: Every day | ORAL | 2 refills | Status: DC

## 2021-02-07 MED ORDER — EMPAGLIFLOZIN 10 MG PO TABS: 10.0000 mg | ORAL_TABLET | Freq: Every day | ORAL | 1 refills | Status: DC

## 2021-02-07 NOTE — Progress Notes (Signed)
Patient's medication [facility]ard[facility] was unaffordable at [facility] and she wanted to send the Jardiance to a mail order. I called and spoke with the patient to confirm. This medication was sent to  [facility] and this was sent today electronically.  Sundra Haddix,cma

## 2021-02-07 NOTE — Patient Instructions (Signed)
Visit Information  Goals Addressed              This Visit's Progress     Patient Stated   .  Medication Management (pt-stated)        Patient Goals/Self-Care Activities . Over the next 90 days, patient will:  - take medications as prescribed check glucose BID, document, and provide at future appointments collaborate with provider on medication access solutions        Patient verbalizes understanding of instructions provided today and agrees to view in [place].   Plan: Telephone follow up appointment with care management team member scheduled for:  ~ 4 weeks as previously scheduled  Catie Darnelle Maffucci, PharmD, [place], [place] Clinical Pharmacist [employer] at [employer] [phone]

## 2021-02-07 NOTE — Chronic Care Management (AMB) (Signed)
Care Management   Pharmacy Note  02/07/2021 Name: Tina Moreno MRN: [id number] DOB: [birth year]  Subjective: Tina Moreno is a 55 y.o. year old female who is a primary care patient of Caryl Bis, Angela Adam, MD. The Care Management team was consulted for assistance with care management and care coordination needs.    Engaged with patient via myChart for medication access question in response to provider referral for pharmacy case management and/or care coordination services.   The patient was given information about Care Management services today including:  1. Care Management services includes personalized support from designated clinical staff supervised by the patient's primary care provider, including individualized plan of care and coordination with other care providers. 2. 24/7 contact phone numbers for assistance for urgent and routine care needs. 3. The patient may stop case management services at any time by phone call to the office staff.  Patient agreed to services and consent obtained.  Assessment:  Review of patient status, including review of consultants reports, laboratory and other test data, was performed as part of comprehensive evaluation and provision of chronic care management services.   SDOH (Social Determinants of Health) assessments and interventions performed:  none today  Objective:  Lab Results  Component Value Date   CREATININE 0.70 08/27/2020   CREATININE 0.64 11/30/2018   CREATININE 0.58 08/26/2018    Lab Results  Component Value Date   HGBA1C 7.7 (H) 12/11/2020       Component Value Date/Time   CHOL 234 (H) 08/27/2020 0937   CHOL 207 (H) 12/11/2016 0956   TRIG 217.0 (H) 08/27/2020 0937   HDL 39.10 08/27/2020 0937   HDL 54 12/11/2016 0956   CHOLHDL 6 08/27/2020 0937   VLDL 43.4 (H) 08/27/2020 0937   LDLCALC 99 04/12/2017 0823   LDLCALC 124 (H) 12/11/2016 0956   LDLDIRECT 128.0 10/10/2020 0916   Clinical ASCVD: No  The 10-year ASCVD  risk score Mikey Bussing DC Jr., et al., 2013) is: 5.2%   Values used to calculate the score:     Age: 24 years     Sex: Female     Is Non-Hispanic African American: No     Diabetic: Yes     Tobacco smoker: No     Systolic Blood Pressure: 106 mmHg     Is BP treated: No     HDL Cholesterol: 39.1 mg/dL     Total Cholesterol: 234 mg/dL     BP Readings from Last 3 Encounters:  01/09/21 130/90  11/27/20 90/60  10/01/20 116/70    Care Plan  Allergies  Allergen Reactions  . Amoxicillin Other (See Comments)    Dizziness  . Other     TREES    Medications Reviewed Today    Reviewed by Avie Arenas, RPH (Pharmacist) on 01/20/21 at 1354  Med List Status: <None>  Medication Order Taking? Sig Documenting Provider Last Dose Status Informant  albuterol (PROVENTIL HFA;VENTOLIN HFA) 108 (90 Base) MCG/ACT inhaler [id number] Yes Inhale 2 puffs into the lungs every 6 (six) hours as needed for wheezing or shortness of breath. McLean-Scocuzza, Nino Glow, MD Taking Active Self  ALPRAZolam Duanne Moron) 0.25 MG tablet [id number] No Take 1 tablet (0.25 mg total) by mouth daily as needed for anxiety.  Patient not taking: Reported on 01/20/2021   Leone Haven, MD Not Taking Active   beclomethasone (QVAR) 80 MCG/ACT inhaler [id number] No Inhale into the lungs 2 (two) times daily.  Patient not taking: Reported on 01/20/2021   [provider] Not Taking Active Self           Med Note Enos Fling Aug 07, 2019  3:37 PM) Using only if she is ill  [place] Patter 100-25 MCG/INH AEPB [id number] Yes INHALE 1 PUFF BY MOUTH EVERY DAY Garnet Sierras, DO Taking Active Self  carisoprodol (SOMA) 350 MG tablet [id number] Yes Take 1 tablet (350 mg total) by mouth 3 (three) times daily as needed for muscle spasms. Leone Haven, MD Taking Active   fluticasone [facility]) 50 MCG/ACT nasal spray [id number] Yes Place 2 sprays into both nostrils daily as needed for allergies or rhinitis. McLean-Scocuzza, Nino Glow, MD Taking  Active Self           Med Note Enos Fling Aug 07, 2019  3:37 PM) Using prn  ibuprofen (ADVIL,MOTRIN) 200 MG tablet [id number] Yes Take 200 mg by mouth every 6 (six) hours as needed. [provider] Taking Active Self  levothyroxine (SYNTHROID) 175 MCG tablet [id number] Yes Take 1 tablet (175 mcg total) by mouth daily before breakfast. Leone Haven, MD Taking Active Self  metFORMIN (GLUCOPHAGE XR) 500 MG 24 hr tablet [id number] Yes Take 1 tablet (500 mg) PO twice daily with meals for one week, then take 2 tablets with breakfast and 1 tablet with dinner for 1 week, then take 2 tablets twice daily with meals ongoing Leone Haven, MD Taking Active            Med Note (NWOGU, IVY A   Mon Jan 20, 2021  1:49 PM) 2 tablet daily  rosuvastatin (CRESTOR) 40 MG tablet [id number] No Take 1 tablet (40 mg total) by mouth daily.  Patient not taking: No sig reported   Leone Haven, MD Not Taking Active   Vitamin D, Ergocalciferol, (DRISDOL) 1.25 MG (50000 UNIT) CAPS capsule [id number] Yes Take 1 capsule (50,000 Units total) by mouth every 7 (seven) days. Leone Haven, MD Taking Active           Patient Active Problem List   Diagnosis Date Noted  . Postmenopausal bleeding 11/27/2020  . Encounter for screening colonoscopy   . Thrombocytosis 01/25/2019  . Chronic rhinitis 01/02/2019  . Cough 01/02/2019  . Itchy eyes 01/02/2019  . Neutrophilia 11/30/2018  . Leg swelling 11/14/2018  . Palpitations 08/26/2018  . DOE (dyspnea on exertion) 08/26/2018  . Left wrist pain 04/12/2017  . Anxiety and depression 10/06/2016  . Headache 07/06/2016  . Allergic rhinitis 07/06/2016  . Reactive airway disease 03/30/2016  . Diabetes ([place]) 12/13/2015  . Vitamin D deficiency 12/13/2015  . Elevated LDL cholesterol level 12/13/2015  . Back pain   . Hypothyroid 01/11/2012  . Morbid obesity ([place]) 01/11/2012    Conditions to be addressed/monitored: HTN, HLD and DMII  Care Plan :  Medication Management  Updates made by De Hollingshead, RPH-CPP since 02/07/2021 12:00 AM    Problem: T2DM, obesity, HLD, hypothyroidism, anxiety/depression, allergic rhinitis   Priority: High  Onset Date: 01/20/2021    Long-Range Goal: Disease Progression Prevention   Start Date: 01/20/2021  Recent Progress: On track  Priority: High  Note:   Current Barriers:  . Unable to independently afford treatment regimen . Unable to achieve control of diabetes   Pharmacist Clinical Goal(s):  Marland Kitchen Over the next 90 days, patient will verbalize ability to afford treatment regimen . achieve adherence to monitoring guidelines and medication adherence to achieve therapeutic efficacy . achieve control of diabetes  as evidenced by A1c  through collaboration with PharmD and provider.   Interventions: . 1:1 collaboration with Leone Haven, MD regarding development and update of comprehensive plan of care as evidenced by provider attestation and co-signature . Inter-disciplinary care team collaboration (see longitudinal plan of care) . Comprehensive medication review performed; medication list updated in electronic medical record  Diabetes: . Uncontrolled; current treatment: metformin 1000 mg BID- though not often taking full dose at last call, Jardiance 10 mg daily - prescribed, but unaffordable at time of last call . Reports today that Jardiance for a 90 day supply from mail order pharmacy would be affordable. CMA sent script for 30 day supply. 90 day supply re-sent.   Hyperlipidemia: . Uncontrolled; current treatment: rosuvastatin 40 mg daily (not taking at time of last call) o Reported never starting rosuvastatin because she was started on too many medications at one time . Discussed clinical benefits of statins (cardiovascular protection, lipid-lowering) and potential side-effects.  . Will plan to revisit starting rosuvastatin at next appointment  Hypothyroidism . Controlled per last lab work;  current treatment: levothyroxine 175 mg daily . Previously recommended to continue current regimen at this time  Allergic Rhinitis . Uncontrolled per patient; current treatment: albuterol 108 mcg PRN, beclomethasone (QVAR) 80 mcg/ACT - 2 puffs BID (taking PRN), Breo Ellipta 100-25 mcg - 1 puff daily, fluticasone 50 mcg PRN . Previously recommended to continue current regimen. Will revisit appropriate treatment moving forward.   Anxiety/Depression . Controlled per patient; current treatment: alprazolam 0.25 mg daily PRN . Previously recommended to continue current regimen  Back Pain . Controlled per patient; current treatment: carisoprodol 350 mg TID PRN, ibuprofen 200 mg every 6 hours PRN . Previously recommended to continue current regimen at this time  Supplementation . Current treatment: vitamin D 50,000 every 7 days  Patient Goals/Self-Care Activities . Over the next 90 days, patient will:  - take medications as prescribed check glucose BID, document, and provide at future appointments collaborate with provider on medication access solutions  Follow Up Plan: Telephone follow up appointment with care management team member scheduled for: ~4 weeks as previously scheduled      Medication Assistance:  None required.  Patient affirms current coverage meets needs.  Follow Up:  Patient agrees to Care Plan and Follow-up.  Plan: Telephone follow up appointment with care management team member scheduled for:  ~ 4 weeks as previously scheduled  Catie Darnelle Maffucci, PharmD, [place], [place] Clinical Pharmacist [employer] [facility] [employer] [phone]

## 2021-02-09 ENCOUNTER — Other Ambulatory Visit: Payer: Self-pay | Admitting: Family Medicine

## 2021-02-09 DIAGNOSIS — E039 Hypothyroidism, unspecified: Secondary | ICD-10-CM

## 2021-02-09 MED ORDER — LEVOTHYROXINE SODIUM 150 MCG PO TABS
150.0000 ug | ORAL_TABLET | Freq: Every day | ORAL | 1 refills | Status: DC
Start: 2021-02-09 — End: 2021-10-27

## 2021-02-10 ENCOUNTER — Telehealth: Payer: Self-pay

## 2021-02-10 NOTE — Telephone Encounter (Signed)
-----   Message from Leone Haven, MD sent at 02/09/2021  8:45 PM EDT ----- I sent the lower dose of the synthroid in to the pharmacy. She needs repeat labs in 6 weeks.

## 2021-02-10 NOTE — Telephone Encounter (Signed)
I called the patient and informed that her new low dose synthroid was sent to the pharmacy and patient was driving and stated she would call back to schedule a lab appointment in 6 weeks.  Tina Moreno,cma

## 2021-02-11 DIAGNOSIS — F411 Generalized anxiety disorder: Secondary | ICD-10-CM | POA: Diagnosis not present

## 2021-02-11 DIAGNOSIS — F331 Major depressive disorder, recurrent, moderate: Secondary | ICD-10-CM | POA: Diagnosis not present

## 2021-02-19 ENCOUNTER — Encounter: Payer: Self-pay | Admitting: Obstetrics and Gynecology

## 2021-02-19 ENCOUNTER — Ambulatory Visit: Payer: BC Managed Care – PPO | Admitting: Obstetrics and Gynecology

## 2021-02-19 ENCOUNTER — Other Ambulatory Visit: Payer: Self-pay

## 2021-02-19 ENCOUNTER — Other Ambulatory Visit (HOSPITAL_COMMUNITY)
Admission: RE | Admit: 2021-02-19 | Discharge: 2021-02-19 | Disposition: A | Discharge status: Home / Self Care | Payer: BC Managed Care – PPO | Source: Ambulatory Visit | Attending: Obstetrics and Gynecology | Admitting: Obstetrics and Gynecology

## 2021-02-19 VITALS — BP 138/93 | HR 77 | Ht 60.0 in | Wt 216.7 lb

## 2021-02-19 DIAGNOSIS — N950 Postmenopausal bleeding: Secondary | ICD-10-CM | POA: Insufficient documentation

## 2021-02-19 DIAGNOSIS — N858 Other specified noninflammatory disorders of uterus: Secondary | ICD-10-CM | POA: Diagnosis not present

## 2021-02-19 NOTE — Addendum Note (Signed)
Addended by: Durwin Glaze on: 02/19/2021 02:55 PM   Modules accepted: Orders

## 2021-02-19 NOTE — Progress Notes (Signed)
HPI:      [name] is a 55 y.o. G2P1001 who LMP was No LMP recorded. (Menstrual status: Perimenopausal).  Subjective:   She presents today she reports that she has had no further postmenopausal bleeding.  She has seen her results.     Hx: The following portions of the patient's history were reviewed and updated as appropriate:             She  has a past medical history of Abnormal Pap smear of cervix, Anxiety, Asthma, Back pain, Cervical dysplasia, Depression, Elevated WBCs, GERD (gastroesophageal reflux disease), and Hypothyroid. She does not have any pertinent problems on file. She  has a past surgical history that includes Colposcopy; Gynecologic cryosurgery; and Colonoscopy with propofol (N/A, 10/01/2020). Her family history includes Cancer in her maternal aunt, maternal uncle, paternal grandmother, and son; Diabetes in her father; Hypertension in her father; Parkinsonism in her father. She  reports that she has never smoked. She has never used smokeless tobacco. She reports current alcohol use. She reports that she does not use drugs. She has a current medication list which includes the following prescription(s): albuterol, alprazolam, beclomethasone, breo ellipta, carisoprodol, empagliflozin, fluticasone, ibuprofen, levothyroxine, metformin, rosuvastatin, and vitamin d (ergocalciferol). She is allergic to amoxicillin and other.       Review of Systems:  Review of Systems  Constitutional: Denied constitutional symptoms, night sweats, recent illness, fatigue, fever, insomnia and weight loss.  Eyes: Denied eye symptoms, eye pain, photophobia, vision change and visual disturbance.  Ears/Nose/Throat/Neck: Denied ear, nose, throat or neck symptoms, hearing loss, nasal discharge, sinus congestion and sore throat.  Cardiovascular: Denied cardiovascular symptoms, arrhythmia, chest pain/pressure, edema, exercise intolerance, orthopnea and palpitations.  Respiratory: Denied pulmonary  symptoms, asthma, pleuritic pain, productive sputum, cough, dyspnea and wheezing.  Gastrointestinal: Denied, gastro-esophageal reflux, melena, nausea and vomiting.  Genitourinary: Denied genitourinary symptoms including symptomatic vaginal discharge, pelvic relaxation issues, and urinary complaints.  Musculoskeletal: Denied musculoskeletal symptoms, stiffness, swelling, muscle weakness and myalgia.  Dermatologic: Denied dermatology symptoms, rash and scar.  Neurologic: Denied neurology symptoms, dizziness, headache, neck pain and syncope.  Psychiatric: Denied psychiatric symptoms, anxiety and depression.  Endocrine: Denied endocrine symptoms including hot flashes and night sweats.   Meds:   Current Outpatient Medications on File Prior to Visit  Medication Sig Dispense Refill  . albuterol (PROVENTIL HFA;VENTOLIN HFA) 108 (90 Base) MCG/ACT inhaler Inhale 2 puffs into the lungs every 6 (six) hours as needed for wheezing or shortness of breath. 1 Inhaler 2  . ALPRAZolam (XANAX) 0.25 MG tablet Take 1 tablet (0.25 mg total) by mouth daily as needed for anxiety. 30 tablet 0  . beclomethasone (QVAR) 80 MCG/ACT inhaler Inhale into the lungs 2 (two) times daily.    Marland Kitchen BREO ELLIPTA 100-25 MCG/INH AEPB INHALE 1 PUFF BY MOUTH EVERY DAY 60 each 0  . carisoprodol (SOMA) 350 MG tablet Take 1 tablet (350 mg total) by mouth 3 (three) times daily as needed for muscle spasms. 30 tablet 0  . empagliflozin (JARDIANCE) 10 MG TABS tablet Take 1 tablet (10 mg total) by mouth daily before breakfast. 90 tablet 1  . fluticasone (FLONASE) 50 MCG/ACT nasal spray Place 2 sprays into both nostrils daily as needed for allergies or rhinitis. 16 g 11  . ibuprofen (ADVIL,MOTRIN) 200 MG tablet Take 200 mg by mouth every 6 (six) hours as needed.    Marland Kitchen levothyroxine (SYNTHROID) 150 MCG tablet Take 1 tablet (150 mcg total) by mouth daily before breakfast. 90  tablet 1  . metFORMIN (GLUCOPHAGE XR) 500 MG 24 hr tablet Take 1 tablet (500  mg) PO twice daily with meals for one week, then take 2 tablets with breakfast and 1 tablet with dinner for 1 week, then take 2 tablets twice daily with meals ongoing 360 tablet 1  . rosuvastatin (CRESTOR) 40 MG tablet Take 1 tablet (40 mg total) by mouth daily. (Patient not taking: No sig reported) 90 tablet 3  . Vitamin D, Ergocalciferol, (DRISDOL) 1.25 MG (50000 UNIT) CAPS capsule Take 1 capsule (50,000 Units total) by mouth every 7 (seven) days. (Patient not taking: Reported on 02/19/2021) 8 capsule 0   No current facility-administered medications on file prior to visit.          Objective:     Vitals:   02/19/21 0851  BP: (!) 138/93  Pulse: 77   Filed Weights   02/19/21 0851  Weight: 216 lb 11.2 oz (98.3 kg)              Endometrial Biopsy After discussion with the patient regarding her abnormal uterine bleeding I recommended that she proceed with an endometrial biopsy for further diagnosis. The risks, benefits, alternatives, and indications for an endometrial biopsy were discussed with the patient in detail. She understood the risks including infection, bleeding, cervical laceration and uterine perforation.  Verbal consent was obtained.   PROCEDURE NOTE:  Vacurette endometrial biopsy was performed using aseptic technique with iodine preparation.  The uterus was sounded to a length of 8 cm.  Adequate sampling was obtained with minimal blood loss.  The patient tolerated the procedure well.  Disposition will be pending pathology   Assessment:    G2P1001 Patient Active Problem List   Diagnosis Date Noted  . Postmenopausal bleeding 11/27/2020  . Encounter for screening colonoscopy   . Thrombocytosis 01/25/2019  . Chronic rhinitis 01/02/2019  . Cough 01/02/2019  . Itchy eyes 01/02/2019  . Neutrophilia 11/30/2018  . Leg swelling 11/14/2018  . Palpitations 08/26/2018  . DOE (dyspnea on exertion) 08/26/2018  . Left wrist pain 04/12/2017  . Anxiety and depression 10/06/2016   . Headache 07/06/2016  . Allergic rhinitis 07/06/2016  . Reactive airway disease 03/30/2016  . Diabetes ([place]) 12/13/2015  . Vitamin D deficiency 12/13/2015  . Elevated LDL cholesterol level 12/13/2015  . Back pain   . Hypothyroid 01/11/2012  . Morbid obesity ([place]) 01/11/2012     1. Postmenopausal bleeding     Patient with a slightly thickened endometrial lining based on ultrasound.  FSH has confirmed menopause. We briefly discussed her thyroid and she takes Synthroid.  Her primary care physician has changed her dose recently.   Plan:            1.  Await endometrial biopsy results.   2.  Consider HRT at next visit.  Orders No orders of the defined types were placed in this encounter.   No orders of the defined types were placed in this encounter.     F/U  No follow-ups on file. I spent 22 minutes involved in the care of this patient preparing to see the patient by obtaining and reviewing her medical history (including labs, imaging tests and prior procedures), documenting clinical information in the electronic health record (EHR), counseling and coordinating care plans, writing and sending prescriptions, ordering tests or procedures and directly communicating with the patient by discussing pertinent items from her history and physical exam as well as detailing my assessment and plan as noted above so  that she has an informed understanding.  All of her questions were answered.  Finis Bud, M.D. 02/19/2021 9:12 AM

## 2021-02-20 DIAGNOSIS — F331 Major depressive disorder, recurrent, moderate: Secondary | ICD-10-CM | POA: Diagnosis not present

## 2021-02-20 DIAGNOSIS — F411 Generalized anxiety disorder: Secondary | ICD-10-CM | POA: Diagnosis not present

## 2021-02-21 ENCOUNTER — Other Ambulatory Visit: Payer: Self-pay

## 2021-02-21 LAB — SURGICAL PATHOLOGY

## 2021-02-24 ENCOUNTER — Ambulatory Visit: Payer: BC Managed Care – PPO | Admitting: Pharmacist

## 2021-02-24 DIAGNOSIS — E119 Type 2 diabetes mellitus without complications: Secondary | ICD-10-CM

## 2021-02-24 DIAGNOSIS — E7800 Pure hypercholesterolemia, unspecified: Secondary | ICD-10-CM

## 2021-02-24 DIAGNOSIS — E039 Hypothyroidism, unspecified: Secondary | ICD-10-CM

## 2021-02-24 MED ORDER — EMPAGLIFLOZIN 10 MG PO TABS: 10.0000 mg | ORAL_TABLET | Freq: Every day | ORAL | 1 refills | Status: DC

## 2021-02-24 MED ORDER — METFORMIN HCL ER 500 MG PO TB24: 1000.0000 mg | ORAL_TABLET | Freq: Every day | ORAL | 1 refills | Status: DC

## 2021-02-24 NOTE — Chronic Care Management (AMB) (Addendum)
Care Management   Pharmacy Note  02/24/2021 Name: Tina Moreno MRN: [id number] DOB: Feb 02, 1966  Subjective: Tina Moreno is a 55 y.o. year old female who is a primary care patient of Caryl Bis, Angela Adam, MD. The Care Management team was consulted for assistance with care management and care coordination needs.    Engaged with patient by telephone for follow up visit in response to provider referral for pharmacy case management and/or care coordination services.   The patient was given information about Care Management services today including:  1. Care Management services includes personalized support from designated clinical staff supervised by the patient's primary care provider, including individualized plan of care and coordination with other care providers. 2. 24/7 contact phone numbers for assistance for urgent and routine care needs. 3. The patient may stop case management services at any time by phone call to the office staff.  Patient agreed to services and consent obtained.  Assessment:  Review of patient status, including review of consultants reports, laboratory and other test data, was performed as part of comprehensive evaluation and provision of chronic care management services.   SDOH (Social Determinants of Health) assessments and interventions performed:  SDOH Interventions   Flowsheet Row Most Recent Value  SDOH Interventions   Financial Strain Interventions Intervention Not Indicated       Objective:  Lab Results  Component Value Date   CREATININE 0.70 08/27/2020   CREATININE 0.64 11/30/2018   CREATININE 0.58 08/26/2018    Lab Results  Component Value Date   HGBA1C 7.7 (H) 12/11/2020       Component Value Date/Time   CHOL 234 (H) 08/27/2020 0937   CHOL 207 (H) 12/11/2016 0956   TRIG 217.0 (H) 08/27/2020 0937   HDL 39.10 08/27/2020 0937   HDL 54 12/11/2016 0956   CHOLHDL 6 08/27/2020 0937   VLDL 43.4 (H) 08/27/2020 0937   LDLCALC 99  04/12/2017 0823   LDLCALC 124 (H) 12/11/2016 0956   LDLDIRECT 128.0 10/10/2020 0916   Clinical ASCVD: No  The 10-year ASCVD risk score Mikey Bussing DC Jr., et al., 2013) is: 5.8%   Values used to calculate the score:     Age: 76 years     Sex: Female     Is Non-Hispanic African American: No     Diabetic: Yes     Tobacco smoker: No     Systolic Blood Pressure: 885 mmHg     Is BP treated: No     HDL Cholesterol: 39.1 mg/dL     Total Cholesterol: 234 mg/dL     BP Readings from Last 3 Encounters:  02/19/21 (!) 138/93  01/09/21 130/90  11/27/20 90/60    Care Plan  Allergies  Allergen Reactions  . Amoxicillin Other (See Comments)    Dizziness  . Other     TREES    Medications Reviewed Today    Reviewed by Avie Arenas, [place] (Pharmacist) on 02/24/21 at 0949  Med List Status: <None>  Medication Order Taking? Sig Documenting Provider Last Dose Status Informant  albuterol (PROVENTIL HFA;VENTOLIN HFA) 108 (90 Base) MCG/ACT inhaler [id number] Yes Inhale 2 puffs into the lungs every 6 (six) hours as needed for wheezing or shortness of breath. McLean-Scocuzza, Nino Glow, MD Taking Active Self  ALPRAZolam Duanne Moron) 0.25 MG tablet [id number] Yes Take 1 tablet (0.25 mg total) by mouth daily as needed for anxiety. Leone Haven, MD Taking Active   beclomethasone (QVAR) 80 MCG/ACT inhaler [id number] No Inhale into the lungs 2 (  two) times daily.  Patient not taking: Reported on 02/24/2021   [provider] Not Taking Active            Med Note Enos Fling Aug 07, 2019  3:37 PM) Using only if she is ill  [place] Patter 100-25 MCG/INH AEPB [id number] No INHALE 1 PUFF BY MOUTH EVERY DAY  Patient not taking: Reported on 02/24/2021   Garnet Sierras, DO Not Taking Active Self  carisoprodol (SOMA) 350 MG tablet [id number] No Take 1 tablet (350 mg total) by mouth 3 (three) times daily as needed for muscle spasms.  Patient not taking: Reported on 02/24/2021   Leone Haven, MD Not Taking Active    empagliflozin (JARDIANCE) 10 MG TABS tablet [id number] Yes Take 1 tablet (10 mg total) by mouth daily before breakfast. Leone Haven, MD Taking Active   fluticasone [facility]) 50 MCG/ACT nasal spray [id number] No Place 2 sprays into both nostrils daily as needed for allergies or rhinitis.  Patient not taking: Reported on 02/24/2021   McLean-Scocuzza, Nino Glow, MD Not Taking Active Self           Med Note Enos Fling Aug 07, 2019  3:37 PM) Using prn  ibuprofen (ADVIL,MOTRIN) 200 MG tablet [id number] No Take 200 mg by mouth every 6 (six) hours as needed.  Patient not taking: Reported on 02/24/2021   [provider] Not Taking Active Self  levothyroxine (SYNTHROID) 150 MCG tablet [id number] Yes Take 1 tablet (150 mcg total) by mouth daily before breakfast. Leone Haven, MD Taking Active   metFORMIN (GLUCOPHAGE XR) 500 MG 24 hr tablet [id number] Yes Take 1 tablet (500 mg) PO twice daily with meals for one week, then take 2 tablets with breakfast and 1 tablet with dinner for 1 week, then take 2 tablets twice daily with meals ongoing Leone Haven, MD Taking Active            Med Note (Kalynne Womac A   Mon Jan 20, 2021  1:49 PM) 2 tablet daily  rosuvastatin (CRESTOR) 40 MG tablet [id number] No Take 1 tablet (40 mg total) by mouth daily.  Patient not taking: No sig reported   Leone Haven, MD Not Taking Active   Vitamin D, Ergocalciferol, (DRISDOL) 1.25 MG (50000 UNIT) CAPS capsule [id number] No Take 1 capsule (50,000 Units total) by mouth every 7 (seven) days.  Patient not taking: No sig reported   Leone Haven, MD Not Taking Active           Patient Active Problem List   Diagnosis Date Noted  . Postmenopausal bleeding 11/27/2020  . Encounter for screening colonoscopy   . Thrombocytosis 01/25/2019  . Chronic rhinitis 01/02/2019  . Cough 01/02/2019  . Itchy eyes 01/02/2019  . Neutrophilia 11/30/2018  . Leg swelling 11/14/2018  . Palpitations 08/26/2018   . DOE (dyspnea on exertion) 08/26/2018  . Left wrist pain 04/12/2017  . Anxiety and depression 10/06/2016  . Headache 07/06/2016  . Allergic rhinitis 07/06/2016  . Reactive airway disease 03/30/2016  . Diabetes ([place]) 12/13/2015  . Vitamin D deficiency 12/13/2015  . Elevated LDL cholesterol level 12/13/2015  . Back pain   . Hypothyroid 01/11/2012  . Morbid obesity ([place]) 01/11/2012    Conditions to be addressed/monitored: HLD, DMII and hypothyroidism  Care Plan : Medication Management  Updates made by Akili Cuda A, RPH since 02/24/2021 12:00 AM    Problem: T2DM, obesity, HLD, hypothyroidism,  anxiety/depression, allergic rhinitis   Priority: High  Onset Date: 01/20/2021    Long-Range Goal: Disease Progression Prevention   Start Date: 01/20/2021  This Visit's Progress: On track  Recent Progress: On track  Priority: High  Note:   Current Barriers:  . Unable to independently afford treatment regimen . Unable to achieve control of diabetes   Pharmacist Clinical Goal(s):  Marland Kitchen Over the next 90 days, patient will verbalize ability to afford treatment regimen . achieve adherence to monitoring guidelines and medication adherence to achieve therapeutic efficacy . achieve control of diabetes as evidenced by A1c  through collaboration with PharmD and provider.   Interventions: . 1:1 collaboration with Leone Haven, MD regarding development and update of comprehensive plan of care as evidenced by provider attestation and co-signature . Inter-disciplinary care team collaboration (see longitudinal plan of care) . Comprehensive medication review performed; medication list updated in electronic medical record  Diabetes: . Uncontrolled; current treatment: metformin XR 1000 mg BID (only taking 1000 mg daily), Jardiance 10 mg daily o Reports diarrhea and abdominal pain with metformin XR 1000 mg BID; can only tolerate 1000 mg daily . Reports not checking blood sugars consistently . Denies  symptomatic hypoglycemia . Denies polyuria and signs/symptoms of UTI/yeast infections and dehydration . Encouraged patient to start checking fasting blood sugars 3-4x/week to properly assess blood sugar control. Patient verbalized understanding.  . Recommended to continue current therapy of metformin XR 1000 mg daily and Jardiance 10 mg daily . Resent prescription for Jardiance 90-day supply to preferred mail order pharmacy as copay cost would be affordable compared to a 30-day supply.   Hyperlipidemia: . Uncontrolled; current treatment: rosuvastatin 40 mg daily (not taking at time of last call) o Reported never starting rosuvastatin because she was started on too many medications at one time and not currently interested in starting at the moment . Discussed clinical benefits of statins (cardiovascular protection, lipid-lowering) and potential side-effects.  . Will plan to revisit starting rosuvastatin at next appointment  Hypothyroidism . Controlled per last lab work; current treatment: levothyroxine 175 mg daily . Previously recommended to continue current regimen at this time  Allergic Rhinitis . Uncontrolled but improving per patient; current treatment: albuterol 108 mcg PRN, beclomethasone (QVAR) 80 mcg/ACT - 2 puffs BID (taking PRN), Breo Ellipta 100-25 mcg - 1 puff daily (taking PRN), fluticasone 50 mcg PRN . Previously recommended to continue current regimen. Will revisit appropriate treatment moving forward.   Anxiety/Depression . Controlled per patient; current treatment: alprazolam 0.25 mg daily PRN . Previously recommended to continue current regimen  Back Pain . Controlled per patient; current treatment: carisoprodol 350 mg TID PRN, ibuprofen 200 mg every 6 hours PRN . Previously recommended to continue current regimen at this time  Supplementation . Current treatment: vitamin D 50,000 every 7 days  Patient Goals/Self-Care Activities . Over the next 90 days, patient will:   - take medications as prescribed check glucose BID, document, and provide at future appointments collaborate with provider on medication access solutions  Follow Up Plan: Telephone follow up appointment with care management team member scheduled for: ~8 weeks     Medication Assistance:  None required.  Patient affirms current coverage meets needs.  Follow Up:  Patient agrees to Care Plan and Follow-up.  Plan: Telephone follow up appointment with care management team member scheduled for:  ~8 weeks  Lorel Monaco, PharmD, BCPS PGY2 [place]    I was present for this visit and  agree with the documentation by the resident as above.   Catie Darnelle Maffucci, PharmD, [place], [place] Clinical Pharmacist [employer] [facility] [place]

## 2021-02-24 NOTE — Patient Instructions (Signed)
  Visit Information  Goals Addressed              This Visit's Progress   .  Medication Management (pt-stated)        Patient Goals/Self-Care Activities . Over the next 90 days, patient will:  - take medications as prescribed check glucose BID, document, and provide at future appointments collaborate with provider on medication access solutions.        Patient verbalizes understanding of instructions provided today and agrees to view in [place].   Telephone follow up appointment with care management team member scheduled for: ~8 weeks  Lorel Monaco, PharmD, BCPS PGY2 [place]

## 2021-02-24 NOTE — Addendum Note (Signed)
Addended by: De Hollingshead on: 02/24/2021 10:46 AM   Modules accepted: Orders

## 2021-02-28 ENCOUNTER — Telehealth: Payer: Self-pay | Admitting: Family Medicine

## 2021-02-28 ENCOUNTER — Ambulatory Visit: Payer: BC Managed Care – PPO | Admitting: Family Medicine

## 2021-02-28 ENCOUNTER — Other Ambulatory Visit: Payer: Self-pay

## 2021-02-28 DIAGNOSIS — F331 Major depressive disorder, recurrent, moderate: Secondary | ICD-10-CM | POA: Diagnosis not present

## 2021-02-28 DIAGNOSIS — J309 Allergic rhinitis, unspecified: Secondary | ICD-10-CM | POA: Diagnosis not present

## 2021-02-28 DIAGNOSIS — E039 Hypothyroidism, unspecified: Secondary | ICD-10-CM | POA: Diagnosis not present

## 2021-02-28 DIAGNOSIS — E119 Type 2 diabetes mellitus without complications: Secondary | ICD-10-CM | POA: Diagnosis not present

## 2021-02-28 DIAGNOSIS — J4520 Mild intermittent asthma, uncomplicated: Secondary | ICD-10-CM

## 2021-02-28 DIAGNOSIS — E559 Vitamin D deficiency, unspecified: Secondary | ICD-10-CM

## 2021-02-28 DIAGNOSIS — F411 Generalized anxiety disorder: Secondary | ICD-10-CM | POA: Diagnosis not present

## 2021-02-28 NOTE — Patient Instructions (Signed)
Nice to see you. We will get lab work in 6 weeks. Please start on vitamin D3 2000 international units once daily by mouth. Please mention the need for Pap smear to your gynecologist.

## 2021-02-28 NOTE — Assessment & Plan Note (Signed)
The patient will start on over-the-counter vitamin D 2000 international units daily.

## 2021-02-28 NOTE — Assessment & Plan Note (Signed)
She will continue Flonase.

## 2021-02-28 NOTE — Assessment & Plan Note (Signed)
I discussed the importance of starting the lower dose of Synthroid.  She notes she will be able to pick this up next week.  Plan for TSH in 6 weeks.

## 2021-02-28 NOTE — Assessment & Plan Note (Signed)
Generally stable.  She will continue her Flonase.  She can continue her inhalers on an as-needed basis.  If she has more persistent symptoms she can let us know.

## 2021-02-28 NOTE — Assessment & Plan Note (Signed)
She will continue metformin 1000 mg once daily and Jardiance 10 mg once daily.  She just restarted the Jardiance and thus we will defer lab work for 6 weeks.

## 2021-02-28 NOTE — Progress Notes (Addendum)
Tommi Rumps, MD Phone: [phone]  Tina Moreno is a 55 y.o. female who presents today for f/u.  HYPOTHYROIDISM Disease Monitoring Weight changes: no  Skin Changes: some dry skin today, though generally no Heat/Cold intolerance: no Fatigue: has improved some with restarting her synthroid  Medication Monitoring Compliance:  taking synthroid though has not started on the lower dose of 150 mcg yet   Last TSH:   Lab Results  Component Value Date   TSH 0.02 (L) 01/28/2021   DIABETES Disease Monitoring: Blood Sugar ranges-200 in the evening, not checking fasting often Polyuria/phagia/dipsia- yes      Optho- due Medications: Compliance- taking jardiance 10 mg once daily for the past 2 weeks, metformin XR 1000 mg daily Hypoglycemic symptoms- occasionally feels low though result is never less than 120  Reactive airway disease: Patient notes occasional symptoms.  She also reports allergy symptoms at times.  Occasional coughing wheezing.  Also occasional congestion, rhinorrhea, and postnasal drip.  She uses her Flonase daily.  She occasionally has to use her albuterol.  She notes the allergist advised she could use the Breo or Qvar on an as-needed basis when she is having a flare.  Vitamin D deficiency: She is not taking any over-the-counter vitamin D.  Social History   Tobacco Use  Smoking Status Never  Smokeless Tobacco Never    Current Outpatient Medications on File Prior to Visit  Medication Sig Dispense Refill   albuterol (PROVENTIL HFA;VENTOLIN HFA) 108 (90 Base) MCG/ACT inhaler Inhale 2 puffs into the lungs every 6 (six) hours as needed for wheezing or shortness of breath. 1 Inhaler 2   ALPRAZolam (XANAX) 0.25 MG tablet Take 1 tablet (0.25 mg total) by mouth daily as needed for anxiety. 30 tablet 0   beclomethasone (QVAR) 80 MCG/ACT inhaler Inhale into the lungs 2 (two) times daily.     BREO ELLIPTA 100-25 MCG/INH AEPB INHALE 1 PUFF BY MOUTH EVERY DAY 60 each 0    carisoprodol (SOMA) 350 MG tablet Take 1 tablet (350 mg total) by mouth 3 (three) times daily as needed for muscle spasms. 30 tablet 0   empagliflozin (JARDIANCE) 10 MG TABS tablet Take 1 tablet (10 mg total) by mouth daily before breakfast. 90 tablet 1   fluticasone (FLONASE) 50 MCG/ACT nasal spray Place 2 sprays into both nostrils daily as needed for allergies or rhinitis. 16 g 11   ibuprofen (ADVIL,MOTRIN) 200 MG tablet Take 200 mg by mouth every 6 (six) hours as needed.     levothyroxine (SYNTHROID) 150 MCG tablet Take 1 tablet (150 mcg total) by mouth daily before breakfast. 90 tablet 1   metFORMIN (GLUCOPHAGE XR) 500 MG 24 hr tablet Take 2 tablets (1,000 mg total) by mouth daily. 180 tablet 1   rosuvastatin (CRESTOR) 40 MG tablet Take 1 tablet (40 mg total) by mouth daily. (Patient not taking: Reported on 02/28/2021) 90 tablet 3   Vitamin D, Ergocalciferol, (DRISDOL) 1.25 MG (50000 UNIT) CAPS capsule Take 1 capsule (50,000 Units total) by mouth every 7 (seven) days. (Patient not taking: Reported on 02/28/2021) 8 capsule 0   No current facility-administered medications on file prior to visit.     ROS see history of present illness  Objective  Physical Exam Vitals:   02/28/21 0933  BP: 130/80  Pulse: 82  Temp: 98.1 F (36.7 C)  SpO2: 98%    BP Readings from Last 3 Encounters:  02/28/21 130/80  02/19/21 (!) 138/93  01/09/21 130/90   Wt Readings from  Last 3 Encounters:  02/28/21 218 lb 6.4 oz (99.1 kg)  02/19/21 216 lb 11.2 oz (98.3 kg)  01/09/21 225 lb 4.8 oz (102.2 kg)    Physical Exam Constitutional:      General: She is not in acute distress.    Appearance: She is not diaphoretic.  Cardiovascular:     Rate and Rhythm: Normal rate and regular rhythm.     Heart sounds: Normal heart sounds.  Pulmonary:     Effort: Pulmonary effort is normal.     Breath sounds: Normal breath sounds.  Musculoskeletal:     Right lower leg: No edema.     Left lower leg: No edema.   Skin:    General: Skin is warm and dry.  Neurological:     Mental Status: She is alert.   Diabetic Foot Exam - Simple   Simple Foot Form Diabetic Foot exam was performed with the following findings: Yes 02/28/2021  9:57 AM  Visual Inspection No deformities, no ulcerations, no other skin breakdown bilaterally: Yes Sensation Testing Intact to touch and monofilament testing bilaterally: Yes Pulse Check Posterior Tibialis and Dorsalis pulse intact bilaterally: Yes Comments      Assessment/Plan: Please see individual problem list.  Problem List Items Addressed This Visit     Hypothyroid    I discussed the importance of starting the lower dose of Synthroid.  She notes she will be able to pick this up next week.  Plan for TSH in 6 weeks.       Relevant Orders   TSH   Diabetes ([place])    She will continue metformin 1000 mg once daily and Jardiance 10 mg once daily.  She just restarted the Jardiance and thus we will defer lab work for 6 weeks.       Relevant Orders   Hemoglobin A1c   Vitamin D deficiency    The patient will start on over-the-counter vitamin D 2000 international units daily.       Relevant Orders   Vitamin D (25 hydroxy)   Reactive airway disease    Generally stable.  She will continue her Flonase.  She can continue her inhalers on an as-needed basis.  If she has more persistent symptoms she can let us know.       Allergic rhinitis    She will continue Flonase.         Health Maintenance: She is due for her Pap smear.  Message sent to gynecology to check on this.  She will call to schedule her mammogram.  I encouraged her to see her eye doctor on a yearly basis. Plan for Pneumonia vaccine at her next visit. She will check with her insurance on shingrix coverage.   Return in about 6 weeks (around 04/11/2021) for Labs, 2-month follow-up with PCP.  This visit occurred during the SARS-CoV-2 public health emergency.  Safety protocols were in place,  including screening questions prior to the visit, additional usage of staff PPE, and extensive cleaning of exam room while observing appropriate contact time as indicated for disinfecting solutions.    Tommi Rumps, MD [place]

## 2021-02-28 NOTE — Telephone Encounter (Signed)
Please let the patient know that I forgot about giving her the pneumonia vaccine at the end of her visit. Please let her know we can do this at her next visit. Please add this to the appointment notes. She should also check with her insurance regarding the shingrix vaccine and if she wants to do this at her next visit she should let us know.

## 2021-03-04 NOTE — Telephone Encounter (Signed)
I called and informed the patient that we missed giving her the pneumonia vaccine at her last visit but we will give it to her at her next visit. I also informed her that she should check with her insurance about the shingles vaccine and if they will pay for it we will give her that as well and she understood.  Tina Moreno,cma

## 2021-03-05 ENCOUNTER — Other Ambulatory Visit: Payer: Self-pay

## 2021-03-05 ENCOUNTER — Ambulatory Visit: Payer: BC Managed Care – PPO | Admitting: Obstetrics and Gynecology

## 2021-03-05 ENCOUNTER — Encounter: Payer: Self-pay | Admitting: Obstetrics and Gynecology

## 2021-03-05 VITALS — BP 131/81 | HR 78 | Ht 60.0 in | Wt 219.6 lb

## 2021-03-05 DIAGNOSIS — N951 Menopausal and female climacteric states: Secondary | ICD-10-CM

## 2021-03-05 DIAGNOSIS — N950 Postmenopausal bleeding: Secondary | ICD-10-CM

## 2021-03-05 DIAGNOSIS — Z79890 Hormone replacement therapy (postmenopausal): Secondary | ICD-10-CM | POA: Diagnosis not present

## 2021-03-05 MED ORDER — ESTRADIOL-NORETHINDRONE ACET 1-0.5 MG PO TABS: 1.0000 | ORAL_TABLET | Freq: Every day | ORAL | 1 refills | Status: DC

## 2021-03-05 NOTE — Progress Notes (Signed)
HPI:      [name] is a 55 y.o. G2P1001 who LMP was No LMP recorded. (Menstrual status: Perimenopausal).  Subjective:   She presents today to discuss her endometrial biopsy and the possibility of HRT. Patient was having postmenopausal bleeding.  She is no longer having this issue. She has relatively few signs and symptoms of menopause.  She states that she does have some trouble sleeping and she has noticed changes in her memory.  She denies hot flashes.  She does describe a family history of osteoporosis in her mother.    Hx: The following portions of the patient's history were reviewed and updated as appropriate:             She  has a past medical history of Abnormal Pap smear of cervix, Anxiety, Asthma, Back pain, Cervical dysplasia, Depression, Elevated WBCs, GERD (gastroesophageal reflux disease), and Hypothyroid. She does not have any pertinent problems on file. She  has a past surgical history that includes Colposcopy; Gynecologic cryosurgery; and Colonoscopy with propofol (N/A, 10/01/2020). Her family history includes Cancer in her maternal aunt, maternal uncle, paternal grandmother, and son; Diabetes in her father; Hypertension in her father; Parkinsonism in her father. She  reports that she has never smoked. She has never used smokeless tobacco. She reports current alcohol use. She reports that she does not use drugs. She has a current medication list which includes the following prescription(s): albuterol, alprazolam, beclomethasone, breo ellipta, carisoprodol, empagliflozin, estradiol-norethindrone, fluticasone, ibuprofen, levothyroxine, metformin, rosuvastatin, and vitamin d (ergocalciferol). She is allergic to amoxicillin and other.       Review of Systems:  Review of Systems  Constitutional: Denied constitutional symptoms, night sweats, recent illness, fatigue, fever, insomnia and weight loss.  Eyes: Denied eye symptoms, eye pain, photophobia, vision change and  visual disturbance.  Ears/Nose/Throat/Neck: Denied ear, nose, throat or neck symptoms, hearing loss, nasal discharge, sinus congestion and sore throat.  Cardiovascular: Denied cardiovascular symptoms, arrhythmia, chest pain/pressure, edema, exercise intolerance, orthopnea and palpitations.  Respiratory: Denied pulmonary symptoms, asthma, pleuritic pain, productive sputum, cough, dyspnea and wheezing.  Gastrointestinal: Denied, gastro-esophageal reflux, melena, nausea and vomiting.  Genitourinary: Denied genitourinary symptoms including symptomatic vaginal discharge, pelvic relaxation issues, and urinary complaints.  Musculoskeletal: Denied musculoskeletal symptoms, stiffness, swelling, muscle weakness and myalgia.  Dermatologic: Denied dermatology symptoms, rash and scar.  Neurologic: Denied neurology symptoms, dizziness, headache, neck pain and syncope.  Psychiatric: Denied psychiatric symptoms, anxiety and depression.  Endocrine: See HPI for additional information.   Meds:   Current Outpatient Medications on File Prior to Visit  Medication Sig Dispense Refill   albuterol (PROVENTIL HFA;VENTOLIN HFA) 108 (90 Base) MCG/ACT inhaler Inhale 2 puffs into the lungs every 6 (six) hours as needed for wheezing or shortness of breath. 1 Inhaler 2   ALPRAZolam (XANAX) 0.25 MG tablet Take 1 tablet (0.25 mg total) by mouth daily as needed for anxiety. 30 tablet 0   beclomethasone (QVAR) 80 MCG/ACT inhaler Inhale into the lungs 2 (two) times daily.     BREO ELLIPTA 100-25 MCG/INH AEPB INHALE 1 PUFF BY MOUTH EVERY DAY 60 each 0   carisoprodol (SOMA) 350 MG tablet Take 1 tablet (350 mg total) by mouth 3 (three) times daily as needed for muscle spasms. 30 tablet 0   empagliflozin (JARDIANCE) 10 MG TABS tablet Take 1 tablet (10 mg total) by mouth daily before breakfast. 90 tablet 1   fluticasone (FLONASE) 50 MCG/ACT nasal spray Place 2 sprays into both nostrils daily as  needed for allergies or rhinitis. 16 g 11    ibuprofen (ADVIL,MOTRIN) 200 MG tablet Take 200 mg by mouth every 6 (six) hours as needed.     levothyroxine (SYNTHROID) 150 MCG tablet Take 1 tablet (150 mcg total) by mouth daily before breakfast. 90 tablet 1   metFORMIN (GLUCOPHAGE XR) 500 MG 24 hr tablet Take 2 tablets (1,000 mg total) by mouth daily. 180 tablet 1   rosuvastatin (CRESTOR) 40 MG tablet Take 1 tablet (40 mg total) by mouth daily. 90 tablet 3   Vitamin D, Ergocalciferol, (DRISDOL) 1.25 MG (50000 UNIT) CAPS capsule Take 1 capsule (50,000 Units total) by mouth every 7 (seven) days. 8 capsule 0   No current facility-administered medications on file prior to visit.          Objective:     Vitals:   03/05/21 0848  BP: 131/81  Pulse: 78   Filed Weights   03/05/21 0848  Weight: 219 lb 9.6 oz (99.6 kg)              Endometrial biopsy results reviewed directly with the patient  [facility] findings reviewed  Assessment:    G2P1001 Patient Active Problem List   Diagnosis Date Noted   Postmenopausal bleeding 11/27/2020   Encounter for screening colonoscopy    Thrombocytosis 01/25/2019   Chronic rhinitis 01/02/2019   Cough 01/02/2019   Itchy eyes 01/02/2019   Neutrophilia 11/30/2018   Leg swelling 11/14/2018   Palpitations 08/26/2018   Left wrist pain 04/12/2017   Anxiety and depression 10/06/2016   Headache 07/06/2016   Allergic rhinitis 07/06/2016   Reactive airway disease 03/30/2016   Diabetes ([place]) 12/13/2015   Vitamin D deficiency 12/13/2015   Elevated LDL cholesterol level 12/13/2015   Back pain    Hypothyroid 01/11/2012   Morbid obesity ([place]) 01/11/2012     1. Postmenopausal hormone therapy   2. Symptomatic menopausal or female climacteric states   3. Postmenopausal bleeding     Endometrial biopsy negative for hyperplasia or malignancy  FSH consistent with menopause  Family history of osteoporosis   Plan:            1.  HRT I have discussed HRT with the patient in detail.  The risk/benefits  of it were reviewed.  She understands that during menopause Estrogen decreases dramatically and that this results in an increased risk of cardiovascular disease as well as osteoporosis.  We have also discussed the fact that hot flashes often result from a decrease in Estrogen, and that by replacing Estrogen, they can often be alleviated.  We have discussed skin, vaginal and urinary tract changes that may also take place from this drop in Estrogen.  Emotional changes have also been linked to Estrogen and we have briefly discussed this.  The benefits of HRT including decrease in hot flashes, vaginal dryness, and osteoporosis were discussed.  The emotional benefit and a possible change in her cardiovascular risk profile was also reviewed.  The risks associated with Hormone Replacement Therapy were also reviewed.  The use of unopposed Estrogen and its relationship to endometrial cancer was discussed.  The addition of Progesterone and its beneficial effect on endometrial cancer was also noted.  The fact that there has been no consistent definitive studies showing an increase in breast cancer in women who use HRT was discussed with the patient.  The possible side effects including breast tenderness, fluid retention, mood changes and vaginal bleeding were discussed.  The patient was informed that this is  an elective medication and that she may choose not to take Hormone Replacement Therapy.  Literature on HRT was made available, and I believe that after answering all of the patient's questions.  Special emphasis on the WHI study, as well as several studies since that pertaining to the risks and benefits of estrogen replacement therapy were compared.  The possible limitations of these studies were discussed including the age stratification of the WHI study.  The possible increased role of Progesterone in these studies was discussed in detail.  Different types of hormone formulation and methods of taking hormone replacement  therapy were discussed. Literature on HRT was made available, and I believe that after answering all of the patient's questions she has an adequate and informed understanding of the risks and benefits of HRT. Patient has elected to begin Activella as a trial. Orders No orders of the defined types were placed in this encounter.    Meds ordered this encounter  Medications   estradiol-norethindrone (ACTIVELLA) 1-0.5 MG tablet    Sig: Take 1 tablet by mouth daily.    Dispense:  90 tablet    Refill:  1      F/U  Return in about 3 months (around 06/05/2021). I spent 24 minutes involved in the care of this patient preparing to see the patient by obtaining and reviewing her medical history (including labs, imaging tests and prior procedures), documenting clinical information in the electronic health record (EHR), counseling and coordinating care plans, writing and sending prescriptions, ordering tests or procedures and directly communicating with the patient by discussing pertinent items from her history and physical exam as well as detailing my assessment and plan as noted above so that she has an informed understanding.  All of her questions were answered.  Finis Bud, M.D. 03/05/2021 9:18 AM

## 2021-03-07 ENCOUNTER — Other Ambulatory Visit: Payer: Self-pay | Admitting: Family Medicine

## 2021-03-07 DIAGNOSIS — F331 Major depressive disorder, recurrent, moderate: Secondary | ICD-10-CM | POA: Diagnosis not present

## 2021-03-07 DIAGNOSIS — E119 Type 2 diabetes mellitus without complications: Secondary | ICD-10-CM

## 2021-03-07 DIAGNOSIS — F411 Generalized anxiety disorder: Secondary | ICD-10-CM | POA: Diagnosis not present

## 2021-03-16 ENCOUNTER — Other Ambulatory Visit: Payer: Self-pay | Admitting: Family Medicine

## 2021-03-16 DIAGNOSIS — E039 Hypothyroidism, unspecified: Secondary | ICD-10-CM

## 2021-04-10 DIAGNOSIS — F331 Major depressive disorder, recurrent, moderate: Secondary | ICD-10-CM | POA: Diagnosis not present

## 2021-04-10 DIAGNOSIS — F411 Generalized anxiety disorder: Secondary | ICD-10-CM | POA: Diagnosis not present

## 2021-04-11 ENCOUNTER — Other Ambulatory Visit (INDEPENDENT_AMBULATORY_CARE_PROVIDER_SITE_OTHER): Payer: BC Managed Care – PPO

## 2021-04-11 ENCOUNTER — Other Ambulatory Visit: Payer: Self-pay

## 2021-04-11 DIAGNOSIS — E559 Vitamin D deficiency, unspecified: Secondary | ICD-10-CM | POA: Diagnosis not present

## 2021-04-11 DIAGNOSIS — E119 Type 2 diabetes mellitus without complications: Secondary | ICD-10-CM

## 2021-04-11 DIAGNOSIS — E039 Hypothyroidism, unspecified: Secondary | ICD-10-CM

## 2021-04-11 LAB — VITAMIN D 25 HYDROXY (VIT D DEFICIENCY, FRACTURES): VITD: 27.22 ng/mL — ABNORMAL LOW (ref 30.00–100.00)

## 2021-04-11 LAB — HEMOGLOBIN A1C: Hgb A1c MFr Bld: 7.2 % — ABNORMAL HIGH (ref 4.6–6.5)

## 2021-04-11 LAB — TSH: TSH: 0.62 u[IU]/mL (ref 0.35–5.50)

## 2021-04-16 DIAGNOSIS — F331 Major depressive disorder, recurrent, moderate: Secondary | ICD-10-CM | POA: Diagnosis not present

## 2021-04-16 DIAGNOSIS — F411 Generalized anxiety disorder: Secondary | ICD-10-CM | POA: Diagnosis not present

## 2021-04-20 ENCOUNTER — Other Ambulatory Visit: Payer: Self-pay | Admitting: Family Medicine

## 2021-04-20 DIAGNOSIS — E119 Type 2 diabetes mellitus without complications: Secondary | ICD-10-CM

## 2021-04-20 MED ORDER — EMPAGLIFLOZIN 25 MG PO TABS: 25.0000 mg | ORAL_TABLET | Freq: Every day | ORAL | 1 refills | Status: DC

## 2021-04-21 ENCOUNTER — Ambulatory Visit: Payer: BC Managed Care – PPO | Admitting: Pharmacist

## 2021-04-21 DIAGNOSIS — E559 Vitamin D deficiency, unspecified: Secondary | ICD-10-CM

## 2021-04-21 DIAGNOSIS — J4520 Mild intermittent asthma, uncomplicated: Secondary | ICD-10-CM

## 2021-04-21 DIAGNOSIS — E119 Type 2 diabetes mellitus without complications: Secondary | ICD-10-CM

## 2021-04-21 DIAGNOSIS — E039 Hypothyroidism, unspecified: Secondary | ICD-10-CM

## 2021-04-21 NOTE — Patient Instructions (Signed)
Visit Information   Goals Addressed               This Visit's Progress     Patient Stated     COMPLETED: Medication Management (pt-stated)        Patient Goals/Self-Care Activities Over the next 90 days, patient will:  - take medications as prescribed check glucose BID, document, and provide at future appointments collaborate with provider on medication access solutions.         Patient verbalizes understanding of instructions provided today and agrees to view in [place].    Plan: Patient has my contact information for any future questions or concerns  Catie Darnelle Maffucci, PharmD, [place], [place] Clinical Pharmacist [employer] at [place]

## 2021-04-21 NOTE — Chronic Care Management (AMB) (Signed)
Care Management   Pharmacy Note  04/21/2021 Name: Tina Moreno MRN: [id number] DOB: 04-11-1966  Subjective: Tina Moreno is a 55 y.o. year old female who is a primary care patient of Caryl Bis, Angela Adam, MD. The Care Management team was consulted for assistance with care management and care coordination needs.    Engaged with patient by telephone for follow up visit in response to provider referral for pharmacy case management and/or care coordination services.   The patient was given information about Care Management services today including:  Care Management services includes personalized support from designated clinical staff supervised by the patient's primary care provider, including individualized plan of care and coordination with other care providers. 24/7 contact phone numbers for assistance for urgent and routine care needs. The patient may stop case management services at any time by phone call to the office staff.  Patient agreed to services and consent obtained.  Assessment:  Review of patient status, including review of consultants reports, laboratory and other test data, was performed as part of comprehensive evaluation and provision of chronic care management services.   SDOH (Social Determinants of Health) assessments and interventions performed:  SDOH Interventions    Flowsheet Row Most Recent Value  SDOH Interventions   Financial Strain Interventions Intervention Not Indicated        Objective:  Lab Results  Component Value Date   CREATININE 0.70 08/27/2020   CREATININE 0.64 11/30/2018   CREATININE 0.58 08/26/2018    Lab Results  Component Value Date   HGBA1C 7.2 (H) 04/11/2021       Component Value Date/Time   CHOL 234 (H) 08/27/2020 0937   CHOL 207 (H) 12/11/2016 0956   TRIG 217.0 (H) 08/27/2020 0937   HDL 39.10 08/27/2020 0937   HDL 54 12/11/2016 0956   CHOLHDL 6 08/27/2020 0937   VLDL 43.4 (H) 08/27/2020 0937   LDLCALC 99 04/12/2017  0823   LDLCALC 124 (H) 12/11/2016 0956   LDLDIRECT 128.0 10/10/2020 0916    Last vitamin D Lab Results  Component Value Date   VD25OH 27.22 (L) 04/11/2021     Clinical ASCVD: No  The 10-year ASCVD risk score Mikey Bussing DC Jr., et al., 2013) is: 5.3%   Values used to calculate the score:     Age: 48 years     Sex: Female     Is Non-Hispanic African American: No     Diabetic: Yes     Tobacco smoker: No     Systolic Blood Pressure: [id number] mmHg     Is BP treated: No     HDL Cholesterol: 39.1 mg/dL     Total Cholesterol: 234 mg/dL     BP Readings from Last 3 Encounters:  03/05/21 131/81  02/28/21 130/80  02/19/21 (!) 138/93    Care Plan  Allergies  Allergen Reactions   Amoxicillin Other (See Comments)    Dizziness   Other     TREES    Medications Reviewed Today     Reviewed by Gordy Councilman, CMA (Certified Medical Assistant) on 02/28/21 at [phone]  Med List Status: <None>   Medication Order Taking? Sig Documenting Provider Last Dose Status Informant  albuterol (PROVENTIL HFA;VENTOLIN HFA) 108 (90 Base) MCG/ACT inhaler [id number] Yes Inhale 2 puffs into the lungs every 6 (six) hours as needed for wheezing or shortness of breath. McLean-Scocuzza, Nino Glow, MD Taking Active Self  ALPRAZolam Duanne Moron) 0.25 MG tablet [id number] Yes Take 1 tablet (0.25 mg total) by mouth daily as  needed for anxiety. Leone Haven, MD Taking Active   beclomethasone (QVAR) 80 MCG/ACT inhaler [id number] Yes Inhale into the lungs 2 (two) times daily. [provider] Taking Active            Med Note Enos Fling Aug 07, 2019  3:37 PM) Using only if she is ill  [place] Patter 100-25 MCG/INH AEPB [id number] Yes INHALE 1 PUFF BY MOUTH EVERY DAY Garnet Sierras, DO Taking Active   carisoprodol (SOMA) 350 MG tablet [id number] Yes Take 1 tablet (350 mg total) by mouth 3 (three) times daily as needed for muscle spasms. Leone Haven, MD Taking Active   empagliflozin (JARDIANCE) 10 MG TABS  tablet [id number] Yes Take 1 tablet (10 mg total) by mouth daily before breakfast. Leone Haven, MD Taking Active   fluticasone [facility]) 50 MCG/ACT nasal spray [id number] Yes Place 2 sprays into both nostrils daily as needed for allergies or rhinitis. McLean-Scocuzza, Nino Glow, MD Taking Active            Med Note Enos Fling Aug 07, 2019  3:37 PM) Using prn  ibuprofen (ADVIL,MOTRIN) 200 MG tablet [id number] Yes Take 200 mg by mouth every 6 (six) hours as needed. [provider] Taking Active   levothyroxine (SYNTHROID) 150 MCG tablet [id number] Yes Take 1 tablet (150 mcg total) by mouth daily before breakfast. Leone Haven, MD Taking Active   metFORMIN (GLUCOPHAGE XR) 500 MG 24 hr tablet [id number] Yes Take 2 tablets (1,000 mg total) by mouth daily. Leone Haven, MD Taking Active   rosuvastatin (CRESTOR) 40 MG tablet [id number] No Take 1 tablet (40 mg total) by mouth daily.  Patient not taking: Reported on 02/28/2021   Leone Haven, MD Not Taking Active   Vitamin D, Ergocalciferol, (DRISDOL) 1.25 MG (50000 UNIT) CAPS capsule [id number] No Take 1 capsule (50,000 Units total) by mouth every 7 (seven) days.  Patient not taking: Reported on 02/28/2021   Leone Haven, MD Not Taking Active             Patient Active Problem List   Diagnosis Date Noted   Postmenopausal bleeding 11/27/2020   Encounter for screening colonoscopy    Thrombocytosis 01/25/2019   Chronic rhinitis 01/02/2019   Cough 01/02/2019   Itchy eyes 01/02/2019   Neutrophilia 11/30/2018   Leg swelling 11/14/2018   Palpitations 08/26/2018   Left wrist pain 04/12/2017   Anxiety and depression 10/06/2016   Headache 07/06/2016   Allergic rhinitis 07/06/2016   Reactive airway disease 03/30/2016   Diabetes ([place]) 12/13/2015   Vitamin D deficiency 12/13/2015   Elevated LDL cholesterol level 12/13/2015   Back pain    Hypothyroid 01/11/2012   Morbid obesity ([place]) 01/11/2012     Conditions to be addressed/monitored: HTN, HLD, and DMII  Care Plan : Medication Management  Updates made by De Hollingshead, RPH-CPP since 04/21/2021 12:00 AM  Completed 04/21/2021   Problem: T2DM, obesity, HLD, hypothyroidism, anxiety/depression, allergic rhinitis Resolved 04/21/2021  Priority: High  Onset Date: 01/20/2021     Long-Range Goal: Disease Progression Prevention Completed 04/21/2021  Start Date: 01/20/2021  This Visit's Progress: On track  Recent Progress: On track  Priority: High  Note:   Current Barriers:  Unable to independently afford treatment regimen Unable to achieve control of diabetes   Pharmacist Clinical Goal(s):  Over the next 90 days, patient will verbalize ability to afford treatment regimen achieve adherence to monitoring guidelines  and medication adherence to achieve therapeutic efficacy achieve control of diabetes as evidenced by A1c  through collaboration with PharmD and provider.   Interventions: 1:1 collaboration with Leone Haven, MD regarding development and update of comprehensive plan of care as evidenced by provider attestation and co-signature Inter-disciplinary care team collaboration (see longitudinal plan of care) Comprehensive medication review performed; medication list updated in electronic medical record  Diabetes: Uncontrolled; current treatment: metformin XR 1000 mg BID, Jardiance 25 mg daily - has not received higher dose yet.  Reports she slowly titrated up to 2000 mg daily of metformin and is now tolerating it. Taking all 4 tablets QPM. Reports BG are doing "better" but does not recall any readings.  Denies polyuria and signs/symptoms of UTI/yeast infections and dehydration Continue current regimen at this time. Follow up with PCP next week as scheduled.   Hyperlipidemia: Uncontrolled; current treatment: rosuvastatin 40 mg daily - previously not taking, but had encouraged at a prior call Previously educated on clinical  benefits of statins (cardiovascular protection, lipid-lowering) and potential side-effects.  Previously recommended to continue current regimen as prescribed. Recheck cholesterol with next lab work.   Hypothyroidism Controlled per last lab work; current treatment: levothyroxine 150 mcg daily Previously recommended to continue current regimen at this time  Allergic Rhinitis/Asthma Uncontrolled but improving per patient; current treatment: albuterol 108 mcg PRN, beclomethasone (QVAR) 80 mcg/ACT - 2 puffs BID (taking PRN), Breo Ellipta 100-25 mcg - 1 puff daily (taking PRN), fluticasone 50 mcg PRN Previously reported to PCP that allergist advised she could take Qvar or Breo PRN.  Previously recommended to continue current regimen. Unlikely that she needs both a PRN ICS and a PRN ICS/LABA. Consider consolidating therapy to PRN Symbicort (strongest data for PRN treatment of asthma)  Anxiety/Depression Controlled per patient; current treatment: alprazolam 0.25 mg daily PRN Previously recommended to continue current regimen. Monitor for frequency of use.   Back Pain Controlled per patient; current treatment: carisoprodol 350 mg TID PRN, ibuprofen 200 mg every 6 hours PRN Previously recommended to continue current regimen at this time  Supplementation Current treatment: vitamin D 50,000 every 7 days  Patient Goals/Self-Care Activities Over the next 90 days, patient will:  - take medications as prescribed check glucose BID, document, and provide at future appointments collaborate with provider on medication access solutions  Follow Up Plan: Patient declines need for continued f/u at this time.     Medication Assistance:  None required.  Patient affirms current coverage meets needs.  Follow Up:  Patient requests no follow-up at this time.  Plan: Patient has my contact information for any future questions or concerns  Catie Darnelle Maffucci, PharmD, [place], [place] Clinical Pharmacist [employer] at [place]

## 2021-04-30 ENCOUNTER — Other Ambulatory Visit: Payer: Self-pay

## 2021-04-30 ENCOUNTER — Ambulatory Visit (INDEPENDENT_AMBULATORY_CARE_PROVIDER_SITE_OTHER): Payer: BC Managed Care – PPO | Admitting: Family Medicine

## 2021-04-30 VITALS — BP 130/70 | HR 82 | Temp 98.2°F | Ht 60.0 in | Wt 216.0 lb

## 2021-04-30 DIAGNOSIS — Z23 Encounter for immunization: Secondary | ICD-10-CM

## 2021-04-30 DIAGNOSIS — M549 Dorsalgia, unspecified: Secondary | ICD-10-CM

## 2021-04-30 DIAGNOSIS — E119 Type 2 diabetes mellitus without complications: Secondary | ICD-10-CM | POA: Diagnosis not present

## 2021-04-30 DIAGNOSIS — G8929 Other chronic pain: Secondary | ICD-10-CM

## 2021-04-30 DIAGNOSIS — E039 Hypothyroidism, unspecified: Secondary | ICD-10-CM

## 2021-04-30 DIAGNOSIS — E559 Vitamin D deficiency, unspecified: Secondary | ICD-10-CM | POA: Diagnosis not present

## 2021-04-30 NOTE — Assessment & Plan Note (Signed)
Intermittent issue.  She will continue home physical therapy exercises.  She will monitor.

## 2021-04-30 NOTE — Progress Notes (Signed)
Tommi Rumps, MD Phone: [phone]  Tina Moreno is a 55 y.o. female who presents today for f/u.  DIABETES Disease Monitoring: Blood Sugar ranges-113 yesterday Polyuria/phagia/dipsia- polydipsia      Optho- due Medications: Compliance- taking jardiance 20 mg daily, metformin XR 2000 mg daily Hypoglycemic symptoms- rare, notes she will check it and it will not be low, she will eat something and typically improve  HYPOTHYROIDISM Disease Monitoring Weight changes: no  Skin Changes: no Heat/Cold intolerance: chronic for both  Medication Monitoring Compliance:  taking synthroid 150 mcg daily   Last TSH:   Lab Results  Component Value Date   TSH 0.62 04/11/2021   Vitamin D deficiency: Patient reports she is taking 4000 international units daily.  Low back pain: Patient notes this is an intermittent issue.  This does not bother her often.  She does physical therapy exercises at home to help prevent this.  She rarely has to use the [employer].   Social History   Tobacco Use  Smoking Status Never  Smokeless Tobacco Never    Current Outpatient Medications on File Prior to Visit  Medication Sig Dispense Refill   albuterol (PROVENTIL HFA;VENTOLIN HFA) 108 (90 Base) MCG/ACT inhaler Inhale 2 puffs into the lungs every 6 (six) hours as needed for wheezing or shortness of breath. 1 Inhaler 2   ALPRAZolam (XANAX) 0.25 MG tablet Take 1 tablet (0.25 mg total) by mouth daily as needed for anxiety. 30 tablet 0   beclomethasone (QVAR) 80 MCG/ACT inhaler Inhale into the lungs 2 (two) times daily.     BREO ELLIPTA 100-25 MCG/INH AEPB INHALE 1 PUFF BY MOUTH EVERY DAY 60 each 0   carisoprodol (SOMA) 350 MG tablet Take 1 tablet (350 mg total) by mouth 3 (three) times daily as needed for muscle spasms. 30 tablet 0   empagliflozin (JARDIANCE) 25 MG TABS tablet Take 1 tablet (25 mg total) by mouth daily before breakfast. 90 tablet 1   estradiol-norethindrone (ACTIVELLA) 1-0.5 MG tablet Take 1 tablet  by mouth daily. 90 tablet 1   fluticasone (FLONASE) 50 MCG/ACT nasal spray Place 2 sprays into both nostrils daily as needed for allergies or rhinitis. 16 g 11   ibuprofen (ADVIL,MOTRIN) 200 MG tablet Take 200 mg by mouth every 6 (six) hours as needed.     levothyroxine (SYNTHROID) 150 MCG tablet Take 1 tablet (150 mcg total) by mouth daily before breakfast. 90 tablet 1   metFORMIN (GLUCOPHAGE-XR) 500 MG 24 hr tablet TAKE 1 TABLET (500 MG) PO TWICE DAILY WITH MEALS FOR ONE WEEK, THEN TAKE 2 TABLETS WITH BREAKFAST AND 1 TABLET WITH DINNER FOR 1 WEEK, THEN TAKE 2 TABLETS TWICE DAILY WITH MEALS ONGOING 360 tablet 1   rosuvastatin (CRESTOR) 40 MG tablet Take 1 tablet (40 mg total) by mouth daily. 90 tablet 3   Vitamin D, Ergocalciferol, (DRISDOL) 1.25 MG (50000 UNIT) CAPS capsule Take 1 capsule (50,000 Units total) by mouth every 7 (seven) days. 8 capsule 0   No current facility-administered medications on file prior to visit.     ROS see history of present illness  Objective  Physical Exam Vitals:   04/30/21 0930  BP: 130/70  Pulse: 82  Temp: 98.2 F (36.8 C)  SpO2: 97%    BP Readings from Last 3 Encounters:  04/30/21 130/70  03/05/21 131/81  02/28/21 130/80   Wt Readings from Last 3 Encounters:  04/30/21 216 lb (98 kg)  03/05/21 219 lb 9.6 oz (99.6 kg)  02/28/21 218 lb  6.4 oz (99.1 kg)    Physical Exam Constitutional:      General: She is not in acute distress.    Appearance: She is not diaphoretic.  Cardiovascular:     Rate and Rhythm: Normal rate and regular rhythm.     Heart sounds: Normal heart sounds.  Pulmonary:     Effort: Pulmonary effort is normal.     Breath sounds: Normal breath sounds.  Musculoskeletal:     Right lower leg: No edema.     Left lower leg: No edema.  Skin:    General: Skin is warm and dry.  Neurological:     Mental Status: She is alert.     Assessment/Plan: Please see individual problem list.  Problem List Items Addressed This Visit      Back pain    Intermittent issue.  She will continue home physical therapy exercises.  She will monitor.       Diabetes ([place])    Improving control.  She will increase the dose of the Jardiance to 25 mg when she picks up the prescription from the pharmacy.  She will continue metformin XR 2000 mg daily.  I suspect her sugars will continue to improving and her A1c will be well controlled at her next visit.       Hypothyroid    She will continue on Synthroid 150 mcg once daily.  Plan for repeat TSH in 3 months.       Vitamin D deficiency    She will continue 4000 international units of vitamin D daily.  We will plan for a lab check in 1 month.       Relevant Orders   Vitamin D (25 hydroxy)   Other Visit Diagnoses     Need for pneumococcal vaccination    -  Primary   Relevant Orders   Pneumococcal conjugate vaccine 20-valent (Prevnar 20) (Completed)   Need for tetanus booster       Relevant Orders   Td : Tetanus/diphtheria >7yo Preservative  free (Completed)        Health Maintenance: She will check with her gynecologist regarding her Pap smear.  Tetanus vaccine and Prevnar 20 vaccine given today.  Return in about 1 month (around 05/31/2021) for labs, 3 months pcp for DM.  This visit occurred during the SARS-CoV-2 public health emergency.  Safety protocols were in place, including screening questions prior to the visit, additional usage of staff PPE, and extensive cleaning of exam room while observing appropriate contact time as indicated for disinfecting solutions.    Tommi Rumps, MD [place]

## 2021-04-30 NOTE — Assessment & Plan Note (Signed)
She will continue 4000 international units of vitamin D daily.  We will plan for a lab check in 1 month.

## 2021-04-30 NOTE — Assessment & Plan Note (Signed)
Improving control.  She will increase the dose of the Jardiance to 25 mg when she picks up the prescription from the pharmacy.  She will continue metformin XR 2000 mg daily.  I suspect her sugars will continue to improving and her A1c will be well controlled at her next visit.

## 2021-04-30 NOTE — Assessment & Plan Note (Signed)
She will continue on Synthroid 150 mcg once daily.  Plan for repeat TSH in 3 months.

## 2021-04-30 NOTE — Patient Instructions (Signed)
Nice to see you. We will recheck your vitamin D in about 4 weeks. I will see you back in 3 months.

## 2021-05-21 DIAGNOSIS — F331 Major depressive disorder, recurrent, moderate: Secondary | ICD-10-CM | POA: Diagnosis not present

## 2021-05-21 DIAGNOSIS — F411 Generalized anxiety disorder: Secondary | ICD-10-CM | POA: Diagnosis not present

## 2021-06-04 ENCOUNTER — Other Ambulatory Visit (INDEPENDENT_AMBULATORY_CARE_PROVIDER_SITE_OTHER): Payer: BC Managed Care – PPO

## 2021-06-04 ENCOUNTER — Other Ambulatory Visit: Payer: Self-pay

## 2021-06-04 DIAGNOSIS — E559 Vitamin D deficiency, unspecified: Secondary | ICD-10-CM | POA: Diagnosis not present

## 2021-06-04 LAB — VITAMIN D 25 HYDROXY (VIT D DEFICIENCY, FRACTURES): VITD: 36.16 ng/mL (ref 30.00–100.00)

## 2021-06-05 DIAGNOSIS — F411 Generalized anxiety disorder: Secondary | ICD-10-CM | POA: Diagnosis not present

## 2021-06-05 DIAGNOSIS — F331 Major depressive disorder, recurrent, moderate: Secondary | ICD-10-CM | POA: Diagnosis not present

## 2021-06-06 ENCOUNTER — Encounter: Payer: BC Managed Care – PPO | Admitting: Obstetrics and Gynecology

## 2021-06-13 ENCOUNTER — Encounter: Payer: BC Managed Care – PPO | Admitting: Obstetrics and Gynecology

## 2021-06-19 ENCOUNTER — Encounter: Payer: Self-pay | Admitting: Obstetrics and Gynecology

## 2021-06-19 ENCOUNTER — Other Ambulatory Visit: Payer: Self-pay

## 2021-06-19 ENCOUNTER — Ambulatory Visit: Payer: BC Managed Care – PPO | Admitting: Obstetrics and Gynecology

## 2021-06-19 VITALS — BP 127/84 | HR 106 | Ht 61.0 in | Wt 213.9 lb

## 2021-06-19 DIAGNOSIS — Z79890 Hormone replacement therapy (postmenopausal): Secondary | ICD-10-CM

## 2021-06-19 MED ORDER — NORETHINDRONE-ETH ESTRADIOL 1-5 MG-MCG PO TABS: 1.0000 | ORAL_TABLET | Freq: Every day | ORAL | 0 refills | Status: DC

## 2021-06-19 NOTE — Progress Notes (Signed)
HPI:      [name] is a 55 y.o. G2P1001 who LMP was Patient's last menstrual period was 06/16/2021 (approximate).  Subjective:   She presents today 3 months after beginning HRT.  She states that she cannot tell a difference being on HRT versus when not taking it.  She does describe spotting "almost every day".  She would like to continue HRT but cannot continue to have bleeding. Previous endometrial biopsy showed no evidence of hyperplasia or malignancy.    Hx: The following portions of the patient's history were reviewed and updated as appropriate:             She  has a past medical history of Abnormal Pap smear of cervix, Anxiety, Asthma, Back pain, Cervical dysplasia, Depression, Elevated WBCs, GERD (gastroesophageal reflux disease), and Hypothyroid. She does not have any pertinent problems on file. She  has a past surgical history that includes Colposcopy; Gynecologic cryosurgery; and Colonoscopy with propofol (N/A, 10/01/2020). Her family history includes Cancer in her maternal aunt, maternal uncle, paternal grandmother, and son; Diabetes in her father; Hypertension in her father; Parkinsonism in her father. She  reports that she has never smoked. She has never used smokeless tobacco. She reports current alcohol use. She reports that she does not use drugs. She has a current medication list which includes the following prescription(s): albuterol, alprazolam, beclomethasone, breo ellipta, carisoprodol, empagliflozin, fluticasone, ibuprofen, levothyroxine, metformin, norethindrone-ethinyl estradiol, rosuvastatin, vitamin d (ergocalciferol), and estradiol-norethindrone. She is allergic to amoxicillin and other.       Review of Systems:  Review of Systems  Constitutional: Denied constitutional symptoms, night sweats, recent illness, fatigue, fever, insomnia and weight loss.  Eyes: Denied eye symptoms, eye pain, photophobia, vision change and visual disturbance.   Ears/Nose/Throat/Neck: Denied ear, nose, throat or neck symptoms, hearing loss, nasal discharge, sinus congestion and sore throat.  Cardiovascular: Denied cardiovascular symptoms, arrhythmia, chest pain/pressure, edema, exercise intolerance, orthopnea and palpitations.  Respiratory: Denied pulmonary symptoms, asthma, pleuritic pain, productive sputum, cough, dyspnea and wheezing.  Gastrointestinal: Denied, gastro-esophageal reflux, melena, nausea and vomiting.  Genitourinary: See HPI for additional information.  Musculoskeletal: Denied musculoskeletal symptoms, stiffness, swelling, muscle weakness and myalgia.  Dermatologic: Denied dermatology symptoms, rash and scar.  Neurologic: Denied neurology symptoms, dizziness, headache, neck pain and syncope.  Psychiatric: Denied psychiatric symptoms, anxiety and depression.  Endocrine: Denied endocrine symptoms including hot flashes and night sweats.   Meds:   Current Outpatient Medications on File Prior to Visit  Medication Sig Dispense Refill   albuterol (PROVENTIL HFA;VENTOLIN HFA) 108 (90 Base) MCG/ACT inhaler Inhale 2 puffs into the lungs every 6 (six) hours as needed for wheezing or shortness of breath. 1 Inhaler 2   ALPRAZolam (XANAX) 0.25 MG tablet Take 1 tablet (0.25 mg total) by mouth daily as needed for anxiety. 30 tablet 0   beclomethasone (QVAR) 80 MCG/ACT inhaler Inhale into the lungs 2 (two) times daily.     BREO ELLIPTA 100-25 MCG/INH AEPB INHALE 1 PUFF BY MOUTH EVERY DAY 60 each 0   carisoprodol (SOMA) 350 MG tablet Take 1 tablet (350 mg total) by mouth 3 (three) times daily as needed for muscle spasms. 30 tablet 0   empagliflozin (JARDIANCE) 25 MG TABS tablet Take 1 tablet (25 mg total) by mouth daily before breakfast. 90 tablet 1   fluticasone (FLONASE) 50 MCG/ACT nasal spray Place 2 sprays into both nostrils daily as needed for allergies or rhinitis. 16 g 11   ibuprofen (ADVIL,MOTRIN) 200 MG tablet  Take 200 mg by mouth every 6  (six) hours as needed.     levothyroxine (SYNTHROID) 150 MCG tablet Take 1 tablet (150 mcg total) by mouth daily before breakfast. 90 tablet 1   metFORMIN (GLUCOPHAGE-XR) 500 MG 24 hr tablet TAKE 1 TABLET (500 MG) PO TWICE DAILY WITH MEALS FOR ONE WEEK, THEN TAKE 2 TABLETS WITH BREAKFAST AND 1 TABLET WITH DINNER FOR 1 WEEK, THEN TAKE 2 TABLETS TWICE DAILY WITH MEALS ONGOING 360 tablet 1   rosuvastatin (CRESTOR) 40 MG tablet Take 1 tablet (40 mg total) by mouth daily. 90 tablet 3   Vitamin D, Ergocalciferol, (DRISDOL) 1.25 MG (50000 UNIT) CAPS capsule Take 1 capsule (50,000 Units total) by mouth every 7 (seven) days. 8 capsule 0   estradiol-norethindrone (ACTIVELLA) 1-0.5 MG tablet Take 1 tablet by mouth daily. 90 tablet 1   No current facility-administered medications on file prior to visit.      Objective:     Vitals:   06/19/21 1517  BP: 127/84  Pulse: (!) 106   Filed Weights   06/19/21 1517  Weight: 213 lb 14.4 oz (97 kg)                        Assessment:    G2P1001 Patient Active Problem List   Diagnosis Date Noted   Postmenopausal bleeding 11/27/2020   Thrombocytosis 01/25/2019   Chronic rhinitis 01/02/2019   Itchy eyes 01/02/2019   Neutrophilia 11/30/2018   Leg swelling 11/14/2018   Palpitations 08/26/2018   Left wrist pain 04/12/2017   Anxiety and depression 10/06/2016   Headache 07/06/2016   Allergic rhinitis 07/06/2016   Reactive airway disease 03/30/2016   Diabetes ([place]) 12/13/2015   Vitamin D deficiency 12/13/2015   Elevated LDL cholesterol level 12/13/2015   Back pain    Hypothyroid 01/11/2012   Morbid obesity ([place]) 01/11/2012     1. Postmenopausal hormone therapy     Recent endometrial biopsy negative.  Breakthrough bleeding with HRT   Plan:            1.  We will change formulation of HRT in the attempt to alleviate bleeding.   Switch toFemHRT I have asked her to contact us in 6 weeks to inform us whether she continues to have bleeding or  not.  Neck step would be to separate the estrogen and progesterone in order to vary the dose.  This was explained to her in detail. Orders No orders of the defined types were placed in this encounter.    Meds ordered this encounter  Medications   norethindrone-ethinyl estradiol (FEMHRT 1/5) 1-5 MG-MCG TABS tablet    Sig: Take 1 tablet by mouth daily.    Dispense:  90 tablet    Refill:  0      F/U  No follow-ups on file. I spent 21 minutes involved in the care of this patient preparing to see the patient by obtaining and reviewing her medical history (including labs, imaging tests and prior procedures), documenting clinical information in the electronic health record (EHR), counseling and coordinating care plans, writing and sending prescriptions, ordering tests or procedures and in direct communicating with the patient and medical staff discussing pertinent items from her history and physical exam.  Finis Bud, M.D. 06/19/2021 4:13 PM

## 2021-06-25 DIAGNOSIS — F411 Generalized anxiety disorder: Secondary | ICD-10-CM | POA: Diagnosis not present

## 2021-06-25 DIAGNOSIS — F331 Major depressive disorder, recurrent, moderate: Secondary | ICD-10-CM | POA: Diagnosis not present

## 2021-07-03 DIAGNOSIS — F331 Major depressive disorder, recurrent, moderate: Secondary | ICD-10-CM | POA: Diagnosis not present

## 2021-07-03 DIAGNOSIS — F411 Generalized anxiety disorder: Secondary | ICD-10-CM | POA: Diagnosis not present

## 2021-07-17 DIAGNOSIS — F331 Major depressive disorder, recurrent, moderate: Secondary | ICD-10-CM | POA: Diagnosis not present

## 2021-07-17 DIAGNOSIS — F411 Generalized anxiety disorder: Secondary | ICD-10-CM | POA: Diagnosis not present

## 2021-07-31 DIAGNOSIS — F331 Major depressive disorder, recurrent, moderate: Secondary | ICD-10-CM | POA: Diagnosis not present

## 2021-07-31 DIAGNOSIS — F411 Generalized anxiety disorder: Secondary | ICD-10-CM | POA: Diagnosis not present

## 2021-08-04 ENCOUNTER — Ambulatory Visit: Payer: BC Managed Care – PPO | Admitting: Family Medicine

## 2021-08-07 DIAGNOSIS — F411 Generalized anxiety disorder: Secondary | ICD-10-CM | POA: Diagnosis not present

## 2021-08-07 DIAGNOSIS — F331 Major depressive disorder, recurrent, moderate: Secondary | ICD-10-CM | POA: Diagnosis not present

## 2021-08-26 DIAGNOSIS — F411 Generalized anxiety disorder: Secondary | ICD-10-CM | POA: Diagnosis not present

## 2021-08-26 DIAGNOSIS — F331 Major depressive disorder, recurrent, moderate: Secondary | ICD-10-CM | POA: Diagnosis not present

## 2021-08-27 DIAGNOSIS — Z1231 Encounter for screening mammogram for malignant neoplasm of breast: Secondary | ICD-10-CM | POA: Diagnosis not present

## 2021-09-05 ENCOUNTER — Ambulatory Visit: Payer: BC Managed Care – PPO | Admitting: Family Medicine

## 2021-09-05 ENCOUNTER — Encounter: Payer: Self-pay | Admitting: Family Medicine

## 2021-09-05 ENCOUNTER — Telehealth: Payer: BC Managed Care – PPO | Admitting: Family Medicine

## 2021-09-05 DIAGNOSIS — E039 Hypothyroidism, unspecified: Secondary | ICD-10-CM | POA: Diagnosis not present

## 2021-09-05 DIAGNOSIS — E119 Type 2 diabetes mellitus without complications: Secondary | ICD-10-CM | POA: Diagnosis not present

## 2021-09-05 DIAGNOSIS — Z20822 Contact with and (suspected) exposure to covid-19: Secondary | ICD-10-CM | POA: Diagnosis not present

## 2021-09-05 DIAGNOSIS — U071 COVID-19: Secondary | ICD-10-CM | POA: Diagnosis not present

## 2021-09-05 HISTORY — DX: COVID-19: U07.1

## 2021-09-05 NOTE — Assessment & Plan Note (Signed)
Undetermined control.  She will come in for labs in several weeks.  Orders placed.  She will continue metformin 2 tablets twice daily and Jardiance 25 mg daily.

## 2021-09-05 NOTE — Assessment & Plan Note (Signed)
Patient with COVID-19 symptoms and a positive home COVID test.  She is outside of the timeframe for treatment with one of the oral antiviral COVID medications.  Discussed supportive care.  Advised to monitor for any worsening symptoms.  Discussed quarantine to last up through 10 days unless she is significantly improved and then she can wear a mask if she has to leave the house.  Advised to seek medical attention for worsening breathing issues, cough productive of blood, or recurrent fevers.

## 2021-09-05 NOTE — Progress Notes (Signed)
Virtual Visit via video Note  This visit type was conducted due to national recommendations for restrictions regarding the COVID-19 pandemic (e.g. social distancing).  This format is felt to be most appropriate for this patient at this time.  All issues noted in this document were discussed and addressed.  No physical exam was performed (except for noted visual exam findings with Video Visits).   I connected with Tina Moreno today at  1:45 PM EST by a video enabled telemedicine application or telephone and verified that I am speaking with the correct person using two identifiers. Location patient: home Location provider: work Persons participating in the virtual visit: patient, provider  I discussed the limitations, risks, security and privacy concerns of performing an evaluation and management service by telephone and the availability of in person appointments. I also discussed with the patient that there may be a patient responsible charge related to this service. The patient expressed understanding and agreed to proceed.  Reason for visit: f/u  HPI: DIABETES Disease Monitoring: Blood Sugar ranges-not checking  Polyuria/phagia/dipsia- notes polydipsia      Optho- due Medications: Compliance- taking metformin, jardiance Hypoglycemic symptoms- 1x 2 weeks ago though it was 114  HYPOTHYROIDISM Disease Monitoring Skin Changes: hair loss, brittle nails  Heat/Cold intolerance: both  Medication Monitoring Compliance:  taking synthroid   Last TSH:   Lab Results  Component Value Date   TSH 0.62 04/11/2021   COVID-19: Patient notes onset of symptoms on 08/31/2021.  She started to feel poorly and had postnasal drip.  Progressively felt worse through Tuesday with significant fatigue and fever of 102 F.  She had some mild dyspnea that feels as though she has trouble getting a full breath at times.  She notes exertional fatigue.  No postnasal drip.  Some sinus congestion.  Mild cough.  Her  cough is nonproductive.   ROS: See pertinent positives and negatives per HPI.  Past Medical History:  Diagnosis Date   Abnormal Pap smear of cervix    Anxiety    Asthma    Back pain    Cervical dysplasia    Depression    Elevated WBCs    NEGATIVE HEM W/U   GERD (gastroesophageal reflux disease)    Hypothyroid     Past Surgical History:  Procedure Laterality Date   COLONOSCOPY WITH PROPOFOL N/A 10/01/2020   Procedure: COLONOSCOPY WITH PROPOFOL;  Surgeon: Lin Landsman, MD;  Location: [facility] ENDOSCOPY;  Service: Gastroenterology;  Laterality: N/A;   COLPOSCOPY     GYNECOLOGIC CRYOSURGERY      Family History  Problem Relation Age of Onset   Parkinsonism Father    Diabetes Father    Hypertension Father    Cancer Maternal Aunt        SOFT CELL SARCOMA   Cancer Maternal Uncle        TESTICULAR   Cancer Paternal Grandmother        COLON   Cancer Son        TESTICULAR    SOCIAL HX: Non-smoker   Current Outpatient Medications:    albuterol (PROVENTIL HFA;VENTOLIN HFA) 108 (90 Base) MCG/ACT inhaler, Inhale 2 puffs into the lungs every 6 (six) hours as needed for wheezing or shortness of breath., Disp: 1 Inhaler, Rfl: 2   ALPRAZolam (XANAX) 0.25 MG tablet, Take 1 tablet (0.25 mg total) by mouth daily as needed for anxiety., Disp: 30 tablet, Rfl: 0   beclomethasone (QVAR) 80 MCG/ACT inhaler, Inhale into the lungs 2 (two)  times daily., Disp: , Rfl:    BREO ELLIPTA 100-25 MCG/INH AEPB, INHALE 1 PUFF BY MOUTH EVERY DAY, Disp: 60 each, Rfl: 0   carisoprodol (SOMA) 350 MG tablet, Take 1 tablet (350 mg total) by mouth 3 (three) times daily as needed for muscle spasms., Disp: 30 tablet, Rfl: 0   empagliflozin (JARDIANCE) 25 MG TABS tablet, Take 1 tablet (25 mg total) by mouth daily before breakfast., Disp: 90 tablet, Rfl: 1   fluticasone (FLONASE) 50 MCG/ACT nasal spray, Place 2 sprays into both nostrils daily as needed for allergies or rhinitis., Disp: 16 g, Rfl: 11   ibuprofen  (ADVIL,MOTRIN) 200 MG tablet, Take 200 mg by mouth every 6 (six) hours as needed., Disp: , Rfl:    levothyroxine (SYNTHROID) 150 MCG tablet, Take 1 tablet (150 mcg total) by mouth daily before breakfast., Disp: 90 tablet, Rfl: 1   metFORMIN (GLUCOPHAGE-XR) 500 MG 24 hr tablet, TAKE 1 TABLET (500 MG) PO TWICE DAILY WITH MEALS FOR ONE WEEK, THEN TAKE 2 TABLETS WITH BREAKFAST AND 1 TABLET WITH DINNER FOR 1 WEEK, THEN TAKE 2 TABLETS TWICE DAILY WITH MEALS ONGOING, Disp: 360 tablet, Rfl: 1   norethindrone-ethinyl estradiol (FEMHRT 1/5) 1-5 MG-MCG TABS tablet, Take 1 tablet by mouth daily., Disp: 90 tablet, Rfl: 0   rosuvastatin (CRESTOR) 40 MG tablet, Take 1 tablet (40 mg total) by mouth daily., Disp: 90 tablet, Rfl: 3   Vitamin D, Ergocalciferol, (DRISDOL) 1.25 MG (50000 UNIT) CAPS capsule, Take 1 capsule (50,000 Units total) by mouth every 7 (seven) days., Disp: 8 capsule, Rfl: 0   estradiol-norethindrone (ACTIVELLA) 1-0.5 MG tablet, Take 1 tablet by mouth daily., Disp: 90 tablet, Rfl: 1  EXAM:  VITALS per patient if applicable:  GENERAL: alert, oriented, appears well and in no acute distress  HEENT: atraumatic, conjunttiva clear, no obvious abnormalities on inspection of external nose and ears  NECK: normal movements of the head and neck  LUNGS: on inspection no signs of respiratory distress, breathing rate appears normal, no obvious gross SOB, gasping or wheezing  CV: no obvious cyanosis  MS: moves all visible extremities without noticeable abnormality  PSYCH/NEURO: pleasant and cooperative, no obvious depression or anxiety, speech and thought processing grossly intact  ASSESSMENT AND PLAN:  Discussed the following assessment and plan:  Problem List Items Addressed This Visit     COVID-19    Patient with COVID-19 symptoms and a positive home COVID test.  She is outside of the timeframe for treatment with one of the oral antiviral COVID medications.  Discussed supportive care.  Advised  to monitor for any worsening symptoms.  Discussed quarantine to last up through 10 days unless she is significantly improved and then she can wear a mask if she has to leave the house.  Advised to seek medical attention for worsening breathing issues, cough productive of blood, or recurrent fevers.      Diabetes ([place])    Undetermined control.  She will come in for labs in several weeks.  Orders placed.  She will continue metformin 2 tablets twice daily and Jardiance 25 mg daily.      Relevant Orders   Ambulatory referral to Ophthalmology   Comp Met (CMET)   Lipid panel   HgB A1c   Urine Microalbumin w/creat. ratio   Hypothyroid    Possibly with worsening hypothyroid symptoms.  She will continue Synthroid 150 mcg once daily.  We will check a TSH with lab work.      Relevant Orders  TSH   Health maintenance: It appears the patient is due for a Pap smear.  She saw her gynecologist on several occasions this past year though did not have a Pap smear completed.  I will send them a message to see if she needs to follow-up with them for a Pap smear.  Return in about 2 weeks (around 09/19/2021) for Labs, 4 months with PCP.   I discussed the assessment and treatment plan with the patient. The patient was provided an opportunity to ask questions and all were answered. The patient agreed with the plan and demonstrated an understanding of the instructions.   The patient was advised to call back or seek an in-person evaluation if the symptoms worsen or if the condition fails to improve as anticipated.   Tommi Rumps, MD

## 2021-09-05 NOTE — Assessment & Plan Note (Signed)
Possibly with worsening hypothyroid symptoms.  She will continue Synthroid 150 mcg once daily.  We will check a TSH with lab work.

## 2021-09-10 DIAGNOSIS — F411 Generalized anxiety disorder: Secondary | ICD-10-CM | POA: Diagnosis not present

## 2021-09-10 DIAGNOSIS — F331 Major depressive disorder, recurrent, moderate: Secondary | ICD-10-CM | POA: Diagnosis not present

## 2021-09-19 ENCOUNTER — Telehealth: Payer: Self-pay | Admitting: Family Medicine

## 2021-09-19 NOTE — Telephone Encounter (Signed)
Rejection Reason - Patient did not respond" Arloa Koh said on Sep 18, 2021 10:46 AM  "lm 09/08/21" Katelyn Deel said on Sep 10, 2021 8:47 AM  Msg from [facility]

## 2021-10-20 ENCOUNTER — Other Ambulatory Visit: Payer: Self-pay | Admitting: Obstetrics and Gynecology

## 2021-10-20 DIAGNOSIS — Z79890 Hormone replacement therapy (postmenopausal): Secondary | ICD-10-CM

## 2021-10-26 ENCOUNTER — Other Ambulatory Visit: Payer: Self-pay | Admitting: Family Medicine

## 2021-10-26 DIAGNOSIS — E039 Hypothyroidism, unspecified: Secondary | ICD-10-CM

## 2021-10-27 ENCOUNTER — Telehealth: Payer: Self-pay | Admitting: Family Medicine

## 2021-10-27 NOTE — Telephone Encounter (Signed)
This patient had lab work ordered during a virtual visit in December.  It looks like she was never scheduled for labs.  Can you call her and get her scheduled?  Orders have already been placed.

## 2021-10-29 NOTE — Telephone Encounter (Signed)
LVM for patieint to cal back to schedule a lab appointment.  Jerell Demery,cma

## 2021-11-04 NOTE — Telephone Encounter (Signed)
I called and spoke with the patient and she is now scheduled for labs.  Delayni Streed,cma

## 2021-11-24 ENCOUNTER — Other Ambulatory Visit: Payer: Self-pay

## 2021-11-24 ENCOUNTER — Other Ambulatory Visit (INDEPENDENT_AMBULATORY_CARE_PROVIDER_SITE_OTHER): Payer: BC Managed Care – PPO

## 2021-11-24 DIAGNOSIS — E039 Hypothyroidism, unspecified: Secondary | ICD-10-CM | POA: Diagnosis not present

## 2021-11-24 DIAGNOSIS — E119 Type 2 diabetes mellitus without complications: Secondary | ICD-10-CM

## 2021-11-24 LAB — MICROALBUMIN / CREATININE URINE RATIO
Creatinine,U: 79.9 mg/dL
Microalb Creat Ratio: 0.9 mg/g (ref 0.0–30.0)
Microalb, Ur: <0.7 mg/dL (ref 0.0–1.9)

## 2021-11-24 LAB — COMPREHENSIVE METABOLIC PANEL
ALT: 10 U/L (ref 0–35)
AST: 12 U/L (ref 0–37)
Albumin: 4.2 g/dL (ref 3.5–5.2)
Alkaline Phosphatase: 71 U/L (ref 39–117)
BUN: 17 mg/dL (ref 6–23)
CO2: 26 mEq/L (ref 19–32)
Calcium: 9.5 mg/dL (ref 8.4–10.5)
Chloride: 101 mEq/L (ref 96–112)
Creatinine, Ser: 0.64 mg/dL (ref 0.40–1.20)
GFR: 101 mL/min (ref >60.00)
Glucose, Bld: 120 mg/dL — ABNORMAL HIGH (ref 70–99)
Potassium: 4.3 mEq/L (ref 3.5–5.1)
Sodium: 139 mEq/L (ref 135–145)
Total Bilirubin: 0.4 mg/dL (ref 0.2–1.2)
Total Protein: 7.2 g/dL (ref 6.0–8.3)

## 2021-11-24 LAB — LIPID PANEL
Cholesterol: 196 mg/dL (ref 0–200)
HDL: 40.4 mg/dL (ref >39.00)
NonHDL: 155.53
Total CHOL/HDL Ratio: 5
Triglycerides: 266 mg/dL — ABNORMAL HIGH (ref 0.0–149.0)
VLDL: 53.2 mg/dL — ABNORMAL HIGH (ref 0.0–40.0)

## 2021-11-24 LAB — HEMOGLOBIN A1C: Hgb A1c MFr Bld: 7.3 % — ABNORMAL HIGH (ref 4.6–6.5)

## 2021-11-24 LAB — TSH: TSH: 0.88 u[IU]/mL (ref 0.35–5.50)

## 2021-11-24 LAB — LDL CHOLESTEROL, DIRECT: Direct LDL: 136 mg/dL

## 2021-12-05 ENCOUNTER — Telehealth: Payer: Self-pay | Admitting: *Deleted

## 2021-12-05 DIAGNOSIS — E7800 Pure hypercholesterolemia, unspecified: Secondary | ICD-10-CM

## 2021-12-05 DIAGNOSIS — E119 Type 2 diabetes mellitus without complications: Secondary | ICD-10-CM

## 2021-12-05 NOTE — Telephone Encounter (Signed)
-----   Message from Leone Haven, MD sent at 12/02/2021  1:49 PM EDT ----- ?See my prior message. ?

## 2021-12-05 NOTE — Telephone Encounter (Signed)
Tried to reach patient no answer left voicemail, third attempt. Please ask these question labs entered. ? ?Noted.  She will need a LDL and hepatic function panel in 6 weeks given that she is starting to take her Crestor consistently.  I would like to do Ozempic though I need to confirm if she has a personal or family history of thyroid cancer, parathyroid cancer, or adrenal gland cancer.  Does she have a history of pancreatitis or gallbladder issues?  Once I know those answers I can determine if I can send the [place] in.  Thanks. ?

## 2021-12-11 ENCOUNTER — Other Ambulatory Visit: Payer: Self-pay

## 2021-12-11 ENCOUNTER — Ambulatory Visit (INDEPENDENT_AMBULATORY_CARE_PROVIDER_SITE_OTHER): Payer: BC Managed Care – PPO

## 2021-12-11 DIAGNOSIS — E119 Type 2 diabetes mellitus without complications: Secondary | ICD-10-CM

## 2021-12-11 NOTE — Progress Notes (Addendum)
Pt arrived to office for Ozempic teaching, pt was shown video on step by step process on using Ozempic.  ? ?After video was shown pt was given sample pen for her to practice on her own and ask any questions if needed. Pt showed skills proficiently without any mistakes. Advised pt that if she is not comfortable injecting herself initially she could give [place] a call to schedule a [place] for [place] to help her with her injection and go over any questions or concerns she may have. Pt verbalized understanding.  ?

## 2021-12-27 ENCOUNTER — Other Ambulatory Visit: Payer: Self-pay | Admitting: Family Medicine

## 2021-12-27 DIAGNOSIS — E119 Type 2 diabetes mellitus without complications: Secondary | ICD-10-CM

## 2022-01-16 ENCOUNTER — Other Ambulatory Visit (INDEPENDENT_AMBULATORY_CARE_PROVIDER_SITE_OTHER): Payer: BC Managed Care – PPO

## 2022-01-16 DIAGNOSIS — E119 Type 2 diabetes mellitus without complications: Secondary | ICD-10-CM | POA: Diagnosis not present

## 2022-01-16 DIAGNOSIS — E7800 Pure hypercholesterolemia, unspecified: Secondary | ICD-10-CM

## 2022-01-16 LAB — HEPATIC FUNCTION PANEL
ALT: 11 U/L (ref 0–35)
AST: 15 U/L (ref 0–37)
Albumin: 4.2 g/dL (ref 3.5–5.2)
Alkaline Phosphatase: 72 U/L (ref 39–117)
Bilirubin, Direct: 0.1 mg/dL (ref 0.0–0.3)
Total Bilirubin: 0.4 mg/dL (ref 0.2–1.2)
Total Protein: 7.1 g/dL (ref 6.0–8.3)

## 2022-01-16 LAB — LDL CHOLESTEROL, DIRECT: Direct LDL: 89 mg/dL

## 2022-01-18 ENCOUNTER — Other Ambulatory Visit: Payer: Self-pay | Admitting: Obstetrics and Gynecology

## 2022-01-18 DIAGNOSIS — Z79890 Hormone replacement therapy (postmenopausal): Secondary | ICD-10-CM

## 2022-01-19 NOTE — Telephone Encounter (Signed)
LMV for patient to call back    ?

## 2022-01-21 ENCOUNTER — Other Ambulatory Visit: Payer: Self-pay | Admitting: Family Medicine

## 2022-01-21 MED ORDER — OZEMPIC (0.25 OR 0.5 MG/DOSE) 2 MG/1.5ML ~~LOC~~ SOPN: PEN_INJECTOR | SUBCUTANEOUS | 1 refills | Status: DC

## 2022-02-22 ENCOUNTER — Other Ambulatory Visit: Payer: Self-pay | Admitting: Family Medicine

## 2022-02-23 NOTE — Telephone Encounter (Signed)
Patient is due for follow-up. She needs this scheduled prior to refilling this medication as it is a controlled substance. Thanks.

## 2022-02-25 ENCOUNTER — Other Ambulatory Visit: Payer: Self-pay | Admitting: Family Medicine

## 2022-02-25 ENCOUNTER — Ambulatory Visit: Payer: BC Managed Care – PPO | Admitting: Family Medicine

## 2022-02-25 ENCOUNTER — Encounter: Payer: Self-pay | Admitting: Family Medicine

## 2022-02-25 VITALS — BP 120/70 | HR 74 | Temp 98.7°F | Ht 61.0 in | Wt 207.6 lb

## 2022-02-25 DIAGNOSIS — R059 Cough, unspecified: Secondary | ICD-10-CM

## 2022-02-25 DIAGNOSIS — M549 Dorsalgia, unspecified: Secondary | ICD-10-CM

## 2022-02-25 DIAGNOSIS — J4521 Mild intermittent asthma with (acute) exacerbation: Secondary | ICD-10-CM

## 2022-02-25 DIAGNOSIS — E7800 Pure hypercholesterolemia, unspecified: Secondary | ICD-10-CM | POA: Diagnosis not present

## 2022-02-25 DIAGNOSIS — G8929 Other chronic pain: Secondary | ICD-10-CM

## 2022-02-25 DIAGNOSIS — E119 Type 2 diabetes mellitus without complications: Secondary | ICD-10-CM | POA: Diagnosis not present

## 2022-02-25 LAB — POCT GLYCOSYLATED HEMOGLOBIN (HGB A1C): Hemoglobin A1C: 6.9 % — AB (ref 4.0–5.6)

## 2022-02-25 MED ORDER — ATORVASTATIN CALCIUM 40 MG PO TABS: 40.0000 mg | ORAL_TABLET | Freq: Every day | ORAL | 3 refills | Status: DC

## 2022-02-25 MED ORDER — CARISOPRODOL 350 MG PO TABS: 350.0000 mg | ORAL_TABLET | Freq: Three times a day (TID) | ORAL | 0 refills | Status: DC | PRN

## 2022-02-25 MED ORDER — TRULICITY 0.75 MG/0.5ML ~~LOC~~ SOAJ
0.7500 mg | SUBCUTANEOUS | 1 refills | Status: DC
Start: 2022-02-25 — End: 2022-06-10

## 2022-02-25 MED ORDER — ALBUTEROL SULFATE HFA 108 (90 BASE) MCG/ACT IN AERS: 2.0000 | INHALATION_SPRAY | Freq: Four times a day (QID) | RESPIRATORY_TRACT | 2 refills | Status: DC | PRN

## 2022-02-25 NOTE — Addendum Note (Signed)
Addended by: Fulton Mole D on: 02/25/2022 03:14 PM   Modules accepted: Orders

## 2022-02-25 NOTE — Assessment & Plan Note (Signed)
Chronic intermittent issue.  We will refill her Soma to use sparingly.

## 2022-02-25 NOTE — Assessment & Plan Note (Signed)
We will see if she can tolerate Lipitor 40 mg daily.  She will have labs in 6 weeks.

## 2022-02-25 NOTE — Assessment & Plan Note (Signed)
Well-controlled.  She has had some GI side effects with the Ozempic.  We will see if she can better tolerate Trulicity 5.80 mg once weekly.  She will continue Jardiance 25 mg daily and metformin XR 1000 mg twice daily.

## 2022-02-25 NOTE — Patient Instructions (Signed)
Nice to see you. Please contact your gynecologist to set up a Pap smear. Please try to get your mammogram results. Please make sure you see the eye doctor once a year. If you are unable to tolerate the Trulicity or the Lipitor please let us know.

## 2022-02-25 NOTE — Progress Notes (Signed)
Tommi Rumps, MD Phone: [phone]  Tina Moreno is a 56 y.o. female who presents today for f/u.  DIABETES Disease Monitoring: Blood Sugar ranges-123 this morning Polyuria/phagia/dipsia- no      Optho-due Medications: Compliance- taking ozempic, jardiance, metformin Hypoglycemic symptoms- no Notes diarrhea with the ozempic.  Hyperlipidemia: Patient stopped the Crestor and her urinary urgency and polydipsia resolved.  Chronic intermittent upper back pain: Patient notes she very rarely has to take [facility] for this.  When it occurs it does not radiate.  There is no numbness or weakness associated with this.   Social History   Tobacco Use  Smoking Status Never  Smokeless Tobacco Never    Current Outpatient Medications on File Prior to Visit  Medication Sig Dispense Refill   ALPRAZolam (XANAX) 0.25 MG tablet Take 1 tablet (0.25 mg total) by mouth daily as needed for anxiety. 30 tablet 0   beclomethasone (QVAR) 80 MCG/ACT inhaler Inhale into the lungs 2 (two) times daily.     BREO ELLIPTA 100-25 MCG/INH AEPB INHALE 1 PUFF BY MOUTH EVERY DAY 60 each 0   fluticasone (FLONASE) 50 MCG/ACT nasal spray Place 2 sprays into both nostrils daily as needed for allergies or rhinitis. 16 g 11   FYAVOLV 1-5 MG-MCG TABS tablet TAKE 1 TABLET BY MOUTH EVERY DAY 84 tablet 1   ibuprofen (ADVIL,MOTRIN) 200 MG tablet Take 200 mg by mouth every 6 (six) hours as needed.     JARDIANCE 25 MG TABS tablet TAKE 1 TABLET BY MOUTH DAILY BEFORE BREAKFAST. 90 tablet 1   levothyroxine (SYNTHROID) 150 MCG tablet TAKE 1 TABLET BY MOUTH DAILY BEFORE BREAKFAST. 90 tablet 1   metFORMIN (GLUCOPHAGE-XR) 500 MG 24 hr tablet TAKE 1 TABLET BY MOUTH TWICE DAILY WITH MEALS FOR ONE WEEK, THEN TAKE 2 TABLETS WITH BREAKFAST AND 1 TABLET WITH DINNER FOR 1 WEEK, THEN TAKE 2 TABLETS TWICE DAILY WITH MEALS ONGOING 360 tablet 1   Vitamin D, Ergocalciferol, (DRISDOL) 1.25 MG (50000 UNIT) CAPS capsule Take 1 capsule (50,000 Units  total) by mouth every 7 (seven) days. 8 capsule 0   No current facility-administered medications on file prior to visit.     ROS see history of present illness  Objective  Physical Exam Vitals:   02/25/22 1446  BP: 120/70  Pulse: 74  Temp: 98.7 F (37.1 C)  SpO2: 98%    BP Readings from Last 3 Encounters:  02/25/22 120/70  06/19/21 127/84  04/30/21 130/70   Wt Readings from Last 3 Encounters:  02/25/22 207 lb 9.6 oz (94.2 kg)  09/05/21 208 lb (94.3 kg)  06/19/21 213 lb 14.4 oz (97 kg)    Physical Exam Constitutional:      General: She is not in acute distress.    Appearance: She is not diaphoretic.  Cardiovascular:     Rate and Rhythm: Normal rate and regular rhythm.     Heart sounds: Normal heart sounds.  Pulmonary:     Effort: Pulmonary effort is normal.     Breath sounds: Normal breath sounds.  Skin:    General: Skin is warm and dry.  Neurological:     Mental Status: She is alert.     Assessment/Plan: Please see individual problem list.  Problem List Items Addressed This Visit     Back pain (Chronic)    Chronic intermittent issue.  We will refill her Soma to use sparingly.       Relevant Medications   carisoprodol (SOMA) 350 MG tablet  Diabetes ([place]) - Primary (Chronic)    Well-controlled.  She has had some GI side effects with the Ozempic.  We will see if she can better tolerate Trulicity 2.99 mg once weekly.  She will continue Jardiance 25 mg daily and metformin XR 1000 mg twice daily.       Relevant Medications   Dulaglutide (TRULICITY) 2.42 AS/3.4HD SOPN   atorvastatin (LIPITOR) 40 MG tablet   Elevated LDL cholesterol level (Chronic)    We will see if she can tolerate Lipitor 40 mg daily.  She will have labs in 6 weeks.       Relevant Medications   atorvastatin (LIPITOR) 40 MG tablet   Other Relevant Orders   Hepatic function panel   LDL cholesterol, direct   Reactive airway disease   Relevant Medications   albuterol (VENTOLIN  HFA) 108 (90 Base) MCG/ACT inhaler   Other Visit Diagnoses     Cough       Relevant Medications   albuterol (VENTOLIN HFA) 108 (90 Base) MCG/ACT inhaler        Health Maintenance: Patient reports she had a mammogram through work.  She will try to get [place] records. She was encouraged to see GYN for her pap smear. Encouraged to have a yearly eye exam.   Return in about 6 weeks (around 04/08/2022) for labs, 3 months PCP for DM.   Tommi Rumps, MD [place]

## 2022-04-17 ENCOUNTER — Other Ambulatory Visit (INDEPENDENT_AMBULATORY_CARE_PROVIDER_SITE_OTHER): Payer: BC Managed Care – PPO

## 2022-04-17 DIAGNOSIS — E7800 Pure hypercholesterolemia, unspecified: Secondary | ICD-10-CM | POA: Diagnosis not present

## 2022-04-17 LAB — HEPATIC FUNCTION PANEL
ALT: 15 U/L (ref 0–35)
AST: 19 U/L (ref 0–37)
Albumin: 4.2 g/dL (ref 3.5–5.2)
Alkaline Phosphatase: 76 U/L (ref 39–117)
Bilirubin, Direct: 0.1 mg/dL (ref 0.0–0.3)
Total Bilirubin: 0.4 mg/dL (ref 0.2–1.2)
Total Protein: 7.4 g/dL (ref 6.0–8.3)

## 2022-04-17 LAB — LDL CHOLESTEROL, DIRECT: Direct LDL: 62 mg/dL

## 2022-04-28 ENCOUNTER — Other Ambulatory Visit: Payer: Self-pay | Admitting: Family Medicine

## 2022-04-28 DIAGNOSIS — E039 Hypothyroidism, unspecified: Secondary | ICD-10-CM

## 2022-06-02 ENCOUNTER — Ambulatory Visit: Payer: BC Managed Care – PPO | Admitting: Family Medicine

## 2022-06-09 ENCOUNTER — Ambulatory Visit: Payer: BC Managed Care – PPO | Admitting: Family Medicine

## 2022-06-09 ENCOUNTER — Encounter: Payer: Self-pay | Admitting: Family Medicine

## 2022-06-09 VITALS — BP 130/70 | HR 75 | Temp 99.1°F | Ht 61.0 in | Wt 210.2 lb

## 2022-06-09 DIAGNOSIS — E039 Hypothyroidism, unspecified: Secondary | ICD-10-CM

## 2022-06-09 DIAGNOSIS — E559 Vitamin D deficiency, unspecified: Secondary | ICD-10-CM

## 2022-06-09 DIAGNOSIS — Z124 Encounter for screening for malignant neoplasm of cervix: Secondary | ICD-10-CM

## 2022-06-09 DIAGNOSIS — E119 Type 2 diabetes mellitus without complications: Secondary | ICD-10-CM | POA: Diagnosis not present

## 2022-06-09 DIAGNOSIS — S91209A Unspecified open wound of unspecified toe(s) with damage to nail, initial encounter: Secondary | ICD-10-CM | POA: Insufficient documentation

## 2022-06-09 LAB — VITAMIN D 25 HYDROXY (VIT D DEFICIENCY, FRACTURES): VITD: 54.42 ng/mL (ref 30.00–100.00)

## 2022-06-09 LAB — TSH: TSH: 14.46 u[IU]/mL — ABNORMAL HIGH (ref 0.35–5.50)

## 2022-06-09 LAB — HEMOGLOBIN A1C: Hgb A1c MFr Bld: 7.2 % — ABNORMAL HIGH (ref 4.6–6.5)

## 2022-06-09 NOTE — Progress Notes (Signed)
Tommi Rumps, MD Phone: [phone]  Tina Moreno is a 55 y.o. female who presents today for f/u.  HYPOTHYROIDISM Disease Monitoring Weight changes: no  Skin Changes: no Heat/Cold intolerance: no  Medication Monitoring Compliance:  taking synthroid   Last TSH:   Lab Results  Component Value Date   TSH 0.88 11/24/2021   DIABETES Disease Monitoring: Blood Sugar ranges-not checking Polyuria/phagia/dipsia- no      Optho- due Medications: Compliance- taking trulicity, metformin, jardiance Hypoglycemic symptoms- no  Left great toenail issue: This fell off recently.  Notes it looked normal prior to falling off other than that it started to separate from the underlying skin.  She had this occur when she was 56 years old.  She noted no injury to the toe.  Notes the toenail has been growing back.   Social History   Tobacco Use  Smoking Status Never  Smokeless Tobacco Never    Current Outpatient Medications on File Prior to Visit  Medication Sig Dispense Refill   albuterol (VENTOLIN HFA) 108 (90 Base) MCG/ACT inhaler Inhale 2 puffs into the lungs every 6 (six) hours as needed for wheezing or shortness of breath. 1 each 2   ALPRAZolam (XANAX) 0.25 MG tablet Take 1 tablet (0.25 mg total) by mouth daily as needed for anxiety. 30 tablet 0   atorvastatin (LIPITOR) 40 MG tablet Take 1 tablet (40 mg total) by mouth daily. 90 tablet 3   beclomethasone (QVAR) 80 MCG/ACT inhaler Inhale into the lungs 2 (two) times daily.     BREO ELLIPTA 100-25 MCG/INH AEPB INHALE 1 PUFF BY MOUTH EVERY DAY 60 each 0   carisoprodol (SOMA) 350 MG tablet Take 1 tablet (350 mg total) by mouth 3 (three) times daily as needed for muscle spasms. 30 tablet 0   Dulaglutide (TRULICITY) 5.17 OH/6.0VP SOPN Inject 0.75 mg into the skin once a week. 6 mL 1   fluticasone (FLONASE) 50 MCG/ACT nasal spray Place 2 sprays into both nostrils daily as needed for allergies or rhinitis. 16 g 11   FYAVOLV 1-5 MG-MCG TABS  tablet TAKE 1 TABLET BY MOUTH EVERY DAY 84 tablet 1   ibuprofen (ADVIL,MOTRIN) 200 MG tablet Take 200 mg by mouth every 6 (six) hours as needed.     JARDIANCE 25 MG TABS tablet TAKE 1 TABLET BY MOUTH DAILY BEFORE BREAKFAST. 90 tablet 1   levothyroxine (SYNTHROID) 150 MCG tablet TAKE 1 TABLET BY MOUTH EVERY DAY BEFORE BREAKFAST 90 tablet 1   metFORMIN (GLUCOPHAGE-XR) 500 MG 24 hr tablet TAKE 1 TABLET BY MOUTH TWICE DAILY WITH MEALS FOR ONE WEEK, THEN TAKE 2 TABLETS WITH BREAKFAST AND 1 TABLET WITH DINNER FOR 1 WEEK, THEN TAKE 2 TABLETS TWICE DAILY WITH MEALS ONGOING 360 tablet 1   Vitamin D, Ergocalciferol, (DRISDOL) 1.25 MG (50000 UNIT) CAPS capsule Take 1 capsule (50,000 Units total) by mouth every 7 (seven) days. 8 capsule 0   No current facility-administered medications on file prior to visit.     ROS see history of present illness  Objective  Physical Exam Vitals:   06/09/22 1010  BP: 130/70  Pulse: 75  Temp: 99.1 F (37.3 C)  SpO2: 97%    BP Readings from Last 3 Encounters:  06/09/22 130/70  02/25/22 120/70  06/19/21 127/84   Wt Readings from Last 3 Encounters:  06/09/22 210 lb 3.2 oz (95.3 kg)  02/25/22 207 lb 9.6 oz (94.2 kg)  09/05/21 208 lb (94.3 kg)    Physical Exam Constitutional:  General: She is not in acute distress.    Appearance: She is not diaphoretic.  Cardiovascular:     Rate and Rhythm: Normal rate and regular rhythm.     Heart sounds: Normal heart sounds.  Pulmonary:     Effort: Pulmonary effort is normal.     Breath sounds: Normal breath sounds.  Skin:    General: Skin is warm and dry.  Neurological:     Mental Status: She is alert.    Diabetic Foot Exam - Simple   Simple Foot Form Diabetic Foot exam was performed with the following findings: Yes 06/09/2022 10:21 AM  Visual Inspection See comments: Yes Sensation Testing Intact to touch and monofilament testing bilaterally: Yes Pulse Check Posterior Tibialis and Dorsalis pulse intact  bilaterally: Yes Comments Left great toenail starting to grow back, no deformities of the nail that is present, no other deformities, ulcerations, or skin breakdown in either foot      Assessment/Plan: Please see individual problem list.  Problem List Items Addressed This Visit     Diabetes ([place]) - Primary (Chronic)    Check A1c.  Continue Trulicity 6.28 mg weekly, Jardiance 25 mg daily, and metformin XR 500 mg twice daily.      Relevant Orders   HgB A1c   Ambulatory referral to Ophthalmology   Hypothyroid (Chronic)    Check TSH.  Continue Synthroid 150 mcg once daily.      Relevant Orders   TSH   Toenail avulsion    Toenail came off on its own.  Appears to be growing back normally.  Discussed at this time we will monitor.  If it happens again we could refer her to dermatology or podiatry.      Vitamin D deficiency   Relevant Orders   Vitamin D (25 hydroxy)   Other Visit Diagnoses     Cervical cancer screening       Relevant Orders   Ambulatory referral to Gynecology        Health Maintenance: The patient will get [place] her mammogram result.  She notes she had it through the mobile mammography center at work in December 2022.  Refer to GYN for Pap smear.  Return in about 6 months (around 12/08/2022).   Tommi Rumps, MD [place]

## 2022-06-09 NOTE — Assessment & Plan Note (Signed)
Toenail came off on its own.  Appears to be growing back normally.  Discussed at this time we will monitor.  If it happens again we could refer her to dermatology or podiatry.

## 2022-06-09 NOTE — Assessment & Plan Note (Signed)
Check A1c.  Continue Trulicity 4.32 mg weekly, Jardiance 25 mg daily, and metformin XR 500 mg twice daily.

## 2022-06-09 NOTE — Assessment & Plan Note (Signed)
Check TSH.  Continue Synthroid 150 mcg once daily.

## 2022-06-10 ENCOUNTER — Other Ambulatory Visit: Payer: Self-pay | Admitting: *Deleted

## 2022-06-10 ENCOUNTER — Other Ambulatory Visit: Payer: Self-pay | Admitting: Family Medicine

## 2022-06-10 DIAGNOSIS — E039 Hypothyroidism, unspecified: Secondary | ICD-10-CM

## 2022-06-10 DIAGNOSIS — E119 Type 2 diabetes mellitus without complications: Secondary | ICD-10-CM

## 2022-06-10 MED ORDER — TRULICITY 1.5 MG/0.5ML ~~LOC~~ SOAJ: 1.5000 mg | SUBCUTANEOUS | 1 refills | Status: DC

## 2022-06-10 MED ORDER — LEVOTHYROXINE SODIUM 175 MCG PO TABS: 175.0000 ug | ORAL_TABLET | Freq: Every day | ORAL | 3 refills | Status: DC

## 2022-07-17 DIAGNOSIS — Z0100 Encounter for examination of eyes and vision without abnormal findings: Secondary | ICD-10-CM | POA: Diagnosis not present

## 2022-07-17 DIAGNOSIS — E119 Type 2 diabetes mellitus without complications: Secondary | ICD-10-CM | POA: Diagnosis not present

## 2022-07-17 LAB — HM DIABETES EYE EXAM

## 2022-07-23 ENCOUNTER — Other Ambulatory Visit: Payer: BC Managed Care – PPO

## 2022-07-30 ENCOUNTER — Other Ambulatory Visit (INDEPENDENT_AMBULATORY_CARE_PROVIDER_SITE_OTHER): Payer: BC Managed Care – PPO

## 2022-07-30 DIAGNOSIS — E039 Hypothyroidism, unspecified: Secondary | ICD-10-CM | POA: Diagnosis not present

## 2022-07-30 LAB — TSH: TSH: 0.9 u[IU]/mL (ref 0.35–5.50)

## 2022-08-19 ENCOUNTER — Encounter: Payer: Self-pay | Admitting: Obstetrics and Gynecology

## 2022-08-19 ENCOUNTER — Other Ambulatory Visit (HOSPITAL_COMMUNITY)
Admission: RE | Admit: 2022-08-19 | Discharge: 2022-08-19 | Disposition: A | Discharge status: Home / Self Care | Payer: BC Managed Care – PPO | Source: Ambulatory Visit | Attending: Obstetrics and Gynecology | Admitting: Obstetrics and Gynecology

## 2022-08-19 ENCOUNTER — Ambulatory Visit: Payer: BC Managed Care – PPO | Admitting: Obstetrics and Gynecology

## 2022-08-19 VITALS — BP 114/74 | HR 67 | Ht 61.0 in | Wt 208.0 lb

## 2022-08-19 DIAGNOSIS — B3731 Acute candidiasis of vulva and vagina: Secondary | ICD-10-CM

## 2022-08-19 DIAGNOSIS — Z124 Encounter for screening for malignant neoplasm of cervix: Secondary | ICD-10-CM | POA: Insufficient documentation

## 2022-08-19 DIAGNOSIS — Z7689 Persons encountering health services in other specified circumstances: Secondary | ICD-10-CM

## 2022-08-19 DIAGNOSIS — Z1231 Encounter for screening mammogram for malignant neoplasm of breast: Secondary | ICD-10-CM

## 2022-08-19 DIAGNOSIS — Z01419 Encounter for gynecological examination (general) (routine) without abnormal findings: Secondary | ICD-10-CM

## 2022-08-19 DIAGNOSIS — Z01411 Encounter for gynecological examination (general) (routine) with abnormal findings: Secondary | ICD-10-CM | POA: Diagnosis not present

## 2022-08-19 MED ORDER — FLUCONAZOLE 150 MG PO TABS: 150.0000 mg | ORAL_TABLET | ORAL | 0 refills | Status: DC

## 2022-08-19 NOTE — Progress Notes (Signed)
Patients presents for annual exam today. She states continuing to take HRT, no spotting any longer. Patient is due for pap smear, ordered. She is due for mammogram, ordered. Annual labs are deferred to PCP. Patient states no other questions or concerns at this time.

## 2022-08-19 NOTE — Progress Notes (Signed)
HPI:      [name] is a 56 y.o. G2P1001 who LMP was Patient's last menstrual period was 06/16/2021 (approximate).  Subjective:   She presents today for her annual examination.  She has no complaints.  She continues to take HRT.  She denies vaginal bleeding.    Hx: The following portions of the patient's history were reviewed and updated as appropriate:             She  has a past medical history of Abnormal Pap smear of cervix, Anxiety, Asthma, Back pain, Cervical dysplasia, COVID-19 (09/05/2021), Depression, Elevated WBCs, GERD (gastroesophageal reflux disease), and Hypothyroid. She does not have any pertinent problems on file. She  has a past surgical history that includes Colposcopy; Gynecologic cryosurgery; and Colonoscopy with propofol (N/A, 10/01/2020). Her family history includes Cancer in her maternal aunt, maternal uncle, paternal grandmother, and son; Diabetes in her father; Hypertension in her father; Parkinsonism in her father. She  reports that she has never smoked. She has never used smokeless tobacco. She reports current alcohol use. She reports that she does not use drugs. She has a current medication list which includes the following prescription(s): albuterol, alprazolam, atorvastatin, beclomethasone, breo ellipta, carisoprodol, trulicity, fluconazole, fluticasone, fyavolv, ibuprofen, jardiance, levothyroxine, metformin, and vitamin d (ergocalciferol). She is allergic to amoxicillin and other.       Review of Systems:  Review of Systems  Constitutional: Denied constitutional symptoms, night sweats, recent illness, fatigue, fever, insomnia and weight loss.  Eyes: Denied eye symptoms, eye pain, photophobia, vision change and visual disturbance.  Ears/Nose/Throat/Neck: Denied ear, nose, throat or neck symptoms, hearing loss, nasal discharge, sinus congestion and sore throat.  Cardiovascular: Denied cardiovascular symptoms, arrhythmia, chest pain/pressure, edema,  exercise intolerance, orthopnea and palpitations.  Respiratory: Denied pulmonary symptoms, asthma, pleuritic pain, productive sputum, cough, dyspnea and wheezing.  Gastrointestinal: Denied, gastro-esophageal reflux, melena, nausea and vomiting.  Genitourinary: Denied genitourinary symptoms including symptomatic vaginal discharge, pelvic relaxation issues, and urinary complaints.  Musculoskeletal: Denied musculoskeletal symptoms, stiffness, swelling, muscle weakness and myalgia.  Dermatologic: Denied dermatology symptoms, rash and scar.  Neurologic: Denied neurology symptoms, dizziness, headache, neck pain and syncope.  Psychiatric: Denied psychiatric symptoms, anxiety and depression.  Endocrine: Denied endocrine symptoms including hot flashes and night sweats.   Meds:   Current Outpatient Medications on File Prior to Visit  Medication Sig Dispense Refill   albuterol (VENTOLIN HFA) 108 (90 Base) MCG/ACT inhaler Inhale 2 puffs into the lungs every 6 (six) hours as needed for wheezing or shortness of breath. 1 each 2   ALPRAZolam (XANAX) 0.25 MG tablet Take 1 tablet (0.25 mg total) by mouth daily as needed for anxiety. 30 tablet 0   atorvastatin (LIPITOR) 40 MG tablet Take 1 tablet (40 mg total) by mouth daily. 90 tablet 3   beclomethasone (QVAR) 80 MCG/ACT inhaler Inhale into the lungs 2 (two) times daily.     BREO ELLIPTA 100-25 MCG/INH AEPB INHALE 1 PUFF BY MOUTH EVERY DAY 60 each 0   carisoprodol (SOMA) 350 MG tablet Take 1 tablet (350 mg total) by mouth 3 (three) times daily as needed for muscle spasms. 30 tablet 0   Dulaglutide (TRULICITY) 1.5 ON/6.2XB SOPN Inject 1.5 mg into the skin once a week. 6 mL 1   fluticasone (FLONASE) 50 MCG/ACT nasal spray Place 2 sprays into both nostrils daily as needed for allergies or rhinitis. 16 g 11   FYAVOLV 1-5 MG-MCG TABS tablet TAKE 1 TABLET BY MOUTH EVERY DAY 84  tablet 1   ibuprofen (ADVIL,MOTRIN) 200 MG tablet Take 200 mg by mouth every 6 (six) hours  as needed.     JARDIANCE 25 MG TABS tablet TAKE 1 TABLET BY MOUTH DAILY BEFORE BREAKFAST. 90 tablet 1   levothyroxine (SYNTHROID) 175 MCG tablet Take 1 tablet (175 mcg total) by mouth daily before breakfast. 90 tablet 3   metFORMIN (GLUCOPHAGE-XR) 500 MG 24 hr tablet TAKE 1 TABLET BY MOUTH TWICE DAILY WITH MEALS FOR ONE WEEK, THEN TAKE 2 TABLETS WITH BREAKFAST AND 1 TABLET WITH DINNER FOR 1 WEEK, THEN TAKE 2 TABLETS TWICE DAILY WITH MEALS ONGOING 360 tablet 1   Vitamin D, Ergocalciferol, (DRISDOL) 1.25 MG (50000 UNIT) CAPS capsule Take 1 capsule (50,000 Units total) by mouth every 7 (seven) days. 8 capsule 0   No current facility-administered medications on file prior to visit.     Objective:     Vitals:   08/19/22 1000  BP: 114/74  Pulse: 67    Filed Weights   08/19/22 1000  Weight: 208 lb (94.3 kg)              Physical examination General NAD, Conversant  HEENT Atraumatic; Op clear with mmm.  Normo-cephalic. Pupils reactive. Anicteric sclerae  Thyroid/Neck Smooth without nodularity or enlargement. Normal ROM.  Neck Supple.  Skin No rashes, lesions or ulceration. Normal palpated skin turgor. No nodularity.  Breasts: No masses or discharge.  Symmetric.  No axillary adenopathy.  Lungs: Clear to auscultation.No rales or wheezes. Normal Respiratory effort, no retractions.  Heart: NSR.  No murmurs or rubs appreciated. No periferal edema  Abdomen: Soft.  Non-tender.  No masses.  No HSM. No hernia  Extremities: Moves all appropriately.  Normal ROM for age. No lymphadenopathy.  Neuro: Oriented to PPT.  Normal mood. Normal affect.     Pelvic:   Vulva: Normal appearance.  No lesions.  Some erythema and thick white discharge likely consistent with monilia  Vagina: No lesions or abnormalities noted.  Support: Normal pelvic support.  Urethra No masses tenderness or scarring.  Meatus Normal size without lesions or prolapse.  Cervix: Normal appearance.  No lesions.  Anus: Normal exam.   No lesions.  Perineum: Normal exam.  No lesions.        Bimanual   Uterus: Normal size.  Non-tender.  Mobile.  AV.  Adnexae: No masses.  Non-tender to palpation.  Cul-de-sac: Negative for abnormality.     Assessment:    G2P1001 Patient Active Problem List   Diagnosis Date Noted   Toenail avulsion 06/09/2022   Postmenopausal bleeding 11/27/2020   Thrombocytosis 01/25/2019   Chronic rhinitis 01/02/2019   Itchy eyes 01/02/2019   Neutrophilia 11/30/2018   Palpitations 08/26/2018   Anxiety and depression 10/06/2016   Headache 07/06/2016   Allergic rhinitis 07/06/2016   Reactive airway disease 03/30/2016   Diabetes ([place]) 12/13/2015   Vitamin D deficiency 12/13/2015   Elevated LDL cholesterol level 12/13/2015   Back pain    Hypothyroid 01/11/2012   Morbid obesity ([place]) 01/11/2012     1. Well woman exam with routine gynecological exam   2. Cervical cancer screening   3. Screening mammogram for breast cancer   4. Monilial vulvovaginitis        Plan:            1.  Basic Screening Recommendations The basic screening recommendations for asymptomatic women were discussed with the patient during her visit.  The age-appropriate recommendations were discussed with her and the rational  for the tests reviewed.  When I am informed by the patient that another primary care physician has previously obtained the age-appropriate tests and they are up-to-date, only outstanding tests are ordered and referrals given as necessary.  Abnormal results of tests will be discussed with her when all of her results are completed.  Routine preventative health maintenance measures emphasized: Exercise/Diet/Weight control, Tobacco Warnings, Alcohol/Substance use risks and Stress Management Pap performed-mammogram ordered 2.  Will treat for presumptive monilia vulvovaginitis as the patient feels irritated and it appears as if she has cutaneous monilia. 3.  Continue HRT Orders Orders Placed This Encounter   Procedures   MM DIGITAL SCREENING BILATERAL     Meds ordered this encounter  Medications   fluconazole (DIFLUCAN) 150 MG tablet    Sig: Take 1 tablet (150 mg total) by mouth every 3 (three) days. For three doses    Dispense:  3 tablet    Refill:  0            F/U  No follow-ups on file.  Finis Bud, M.D. 08/19/2022 10:42 AM

## 2022-08-20 LAB — CYTOLOGY - PAP
Adequacy: ABSENT
Comment: NEGATIVE
Diagnosis: NEGATIVE
High risk HPV: NEGATIVE

## 2022-08-29 ENCOUNTER — Other Ambulatory Visit: Payer: Self-pay | Admitting: Family Medicine

## 2022-08-29 ENCOUNTER — Other Ambulatory Visit: Payer: Self-pay | Admitting: Obstetrics and Gynecology

## 2022-08-29 DIAGNOSIS — E119 Type 2 diabetes mellitus without complications: Secondary | ICD-10-CM

## 2022-08-29 DIAGNOSIS — Z79890 Hormone replacement therapy (postmenopausal): Secondary | ICD-10-CM

## 2022-09-03 ENCOUNTER — Other Ambulatory Visit: Payer: Self-pay | Admitting: Family Medicine

## 2022-09-03 DIAGNOSIS — E119 Type 2 diabetes mellitus without complications: Secondary | ICD-10-CM

## 2022-12-08 ENCOUNTER — Encounter: Payer: Self-pay | Admitting: Family Medicine

## 2022-12-08 ENCOUNTER — Ambulatory Visit: Payer: BC Managed Care – PPO | Admitting: Family Medicine

## 2022-12-08 VITALS — BP 118/80 | HR 96 | Temp 97.7°F | Ht 61.0 in | Wt 203.8 lb

## 2022-12-08 DIAGNOSIS — Z1231 Encounter for screening mammogram for malignant neoplasm of breast: Secondary | ICD-10-CM

## 2022-12-08 DIAGNOSIS — F419 Anxiety disorder, unspecified: Secondary | ICD-10-CM

## 2022-12-08 DIAGNOSIS — E785 Hyperlipidemia, unspecified: Secondary | ICD-10-CM

## 2022-12-08 DIAGNOSIS — E039 Hypothyroidism, unspecified: Secondary | ICD-10-CM | POA: Diagnosis not present

## 2022-12-08 DIAGNOSIS — J4520 Mild intermittent asthma, uncomplicated: Secondary | ICD-10-CM

## 2022-12-08 DIAGNOSIS — E559 Vitamin D deficiency, unspecified: Secondary | ICD-10-CM | POA: Diagnosis not present

## 2022-12-08 DIAGNOSIS — F32A Depression, unspecified: Secondary | ICD-10-CM

## 2022-12-08 DIAGNOSIS — E119 Type 2 diabetes mellitus without complications: Secondary | ICD-10-CM | POA: Diagnosis not present

## 2022-12-08 LAB — COMPREHENSIVE METABOLIC PANEL
ALT: 10 U/L (ref 0–35)
AST: 15 U/L (ref 0–37)
Albumin: 3.8 g/dL (ref 3.5–5.2)
Alkaline Phosphatase: 80 U/L (ref 39–117)
BUN: 10 mg/dL (ref 6–23)
CO2: 25 mEq/L (ref 19–32)
Calcium: 9.1 mg/dL (ref 8.4–10.5)
Chloride: 108 mEq/L (ref 96–112)
Creatinine, Ser: 0.52 mg/dL (ref 0.40–1.20)
GFR: 105.42 mL/min (ref >60.00)
Glucose, Bld: 118 mg/dL — ABNORMAL HIGH (ref 70–99)
Potassium: 3.9 mEq/L (ref 3.5–5.1)
Sodium: 143 mEq/L (ref 135–145)
Total Bilirubin: 0.3 mg/dL (ref 0.2–1.2)
Total Protein: 6.7 g/dL (ref 6.0–8.3)

## 2022-12-08 LAB — LIPID PANEL
Cholesterol: 131 mg/dL (ref 0–200)
HDL: 38.2 mg/dL — ABNORMAL LOW (ref >39.00)
LDL Cholesterol: 68 mg/dL (ref 0–99)
NonHDL: 93.28
Total CHOL/HDL Ratio: 3
Triglycerides: 128 mg/dL (ref 0.0–149.0)
VLDL: 25.6 mg/dL (ref 0.0–40.0)

## 2022-12-08 LAB — MICROALBUMIN / CREATININE URINE RATIO
Creatinine,U: 87.4 mg/dL
Microalb Creat Ratio: 0.8 mg/g (ref 0.0–30.0)
Microalb, Ur: <0.7 mg/dL (ref 0.0–1.9)

## 2022-12-08 LAB — HEMOGLOBIN A1C: Hgb A1c MFr Bld: 6.9 % — ABNORMAL HIGH (ref 4.6–6.5)

## 2022-12-08 LAB — VITAMIN D 25 HYDROXY (VIT D DEFICIENCY, FRACTURES): VITD: 57.17 ng/mL (ref 30.00–100.00)

## 2022-12-08 LAB — TSH: TSH: 0.02 u[IU]/mL — ABNORMAL LOW (ref 0.35–5.50)

## 2022-12-08 MED ORDER — FLUTICASONE FUROATE-VILANTEROL 100-25 MCG/ACT IN AEPB: INHALATION_SPRAY | RESPIRATORY_TRACT | 0 refills | Status: DC

## 2022-12-08 MED ORDER — ALPRAZOLAM 0.25 MG PO TABS: 0.2500 mg | ORAL_TABLET | Freq: Every day | ORAL | 0 refills | Status: AC | PRN

## 2022-12-08 NOTE — Assessment & Plan Note (Signed)
Chronic issue.  Check A1c.  Continue Trulicity 1.5 mg weekly, Jardiance 25 mg daily, metformin XR 1000 mg twice daily.  Refer to endocrinology at patient request.

## 2022-12-08 NOTE — Assessment & Plan Note (Signed)
Chronic issue.  Check TSH.  Continue Synthroid 175 mcg daily.

## 2022-12-08 NOTE — Assessment & Plan Note (Signed)
Chronic issue.  Patient declines additional medication for this.  Discussed getting back into therapy if her anxiety remains elevated.  She can continue Xanax 0.25 mg daily as needed for anxiety.  Controlled substance database reviewed.  Refill provided.

## 2022-12-08 NOTE — Patient Instructions (Addendum)
Nice to see you. We will get lab work today and contact you with the results. Endocrinology should contact you.  If you do not hear from them in the next 2 weeks please let us know. You are due for a mammogram.  Please call [phone] to get this scheduled.

## 2022-12-08 NOTE — Progress Notes (Signed)
Tommi Rumps, MD Phone: [phone]  Tina Moreno is a 57 y.o. female who presents today for f/u.  HYPOTHYROIDISM Disease Monitoring Weight changes: no  Skin Changes: no Heat/Cold intolerance: heat  Medication Monitoring Compliance:  taking synthroid   Last TSH:   Lab Results  Component Value Date   TSH 0.90 07/30/2022   DIABETES Disease Monitoring: Blood Sugar ranges-avg 130s Polyuria/phagia/dipsia- notes polydipsia and polyuria      Optho- UTD Medications: Compliance- taking trulicity, jardiance, metformin Hypoglycemic symptoms- notes occasionally having symptoms though her sugar is only around 114 when she checks it Patient requests a endocrinology referral for this.  Vitamin D deficiency: Patient is taking 4000 international units daily for the last couple of years.  Reactive airway disease: Patient is currently not on anything.  She typically has symptoms this time of year.  Typical symptoms are cough, wheeze, shortness of breath.  She is unsure if Qvar or Breo were more helpful in the past.  Anxiety: Patient notes this is up recently with a recent trip.  She notes depression is a constant in her life.  No SI.  She has not done therapy in a while.  She takes Xanax as needed.   Social History   Tobacco Use  Smoking Status Never  Smokeless Tobacco Never    Current Outpatient Medications on File Prior to Visit  Medication Sig Dispense Refill   albuterol (VENTOLIN HFA) 108 (90 Base) MCG/ACT inhaler Inhale 2 puffs into the lungs every 6 (six) hours as needed for wheezing or shortness of breath. 1 each 2   atorvastatin (LIPITOR) 40 MG tablet Take 1 tablet (40 mg total) by mouth daily. 90 tablet 3   carisoprodol (SOMA) 350 MG tablet Take 1 tablet (350 mg total) by mouth 3 (three) times daily as needed for muscle spasms. 30 tablet 0   Dulaglutide (TRULICITY) 1.5 0000000 SOPN Inject 1.5 mg into the skin once a week. 6 mL 1   esomeprazole (NEXIUM) 20 MG capsule Take  20 mg by mouth daily at 12 noon.     fluconazole (DIFLUCAN) 150 MG tablet Take 1 tablet (150 mg total) by mouth every 3 (three) days. For three doses 3 tablet 0   fluticasone (FLONASE) 50 MCG/ACT nasal spray Place 2 sprays into both nostrils daily as needed for allergies or rhinitis. 16 g 11   ibuprofen (ADVIL,MOTRIN) 200 MG tablet Take 200 mg by mouth every 6 (six) hours as needed.     JARDIANCE 25 MG TABS tablet TAKE 1 TABLET BY MOUTH EVERY DAY BEFORE BREAKFAST 90 tablet 1   levothyroxine (SYNTHROID) 175 MCG tablet Take 1 tablet (175 mcg total) by mouth daily before breakfast. 90 tablet 3   metFORMIN (GLUCOPHAGE-XR) 500 MG 24 hr tablet TAKE 1 TABLET BY MOUTH TWICE DAILY WITH MEALS FOR ONE WEEK, THEN TAKE 2 TABLETS WITH BREAKFAST AND 1 TABLET WITH DINNER FOR 1 WEEK, THEN TAKE 2 TABLETS TWICE DAILY WITH MEALS ONGOING 360 tablet 1   norethindrone-ethinyl estradiol (FYAVOLV) 1-5 MG-MCG TABS tablet TAKE 1 TABLET BY MOUTH EVERY DAY 84 tablet 3   Vitamin D, Ergocalciferol, (DRISDOL) 1.25 MG (50000 UNIT) CAPS capsule Take 1 capsule (50,000 Units total) by mouth every 7 (seven) days. 8 capsule 0   No current facility-administered medications on file prior to visit.     ROS see history of present illness  Objective  Physical Exam Vitals:   12/08/22 0851  BP: 118/80  Pulse: 96  Temp: 97.7 F (36.5 C)  SpO2: 94%    BP Readings from Last 3 Encounters:  12/08/22 118/80  08/19/22 114/74  06/09/22 130/70   Wt Readings from Last 3 Encounters:  12/08/22 203 lb 12.8 oz (92.4 kg)  08/19/22 208 lb (94.3 kg)  06/09/22 210 lb 3.2 oz (95.3 kg)    Physical Exam Constitutional:      General: She is not in acute distress.    Appearance: She is not diaphoretic.  Cardiovascular:     Rate and Rhythm: Normal rate and regular rhythm.     Heart sounds: Normal heart sounds.  Pulmonary:     Effort: Pulmonary effort is normal.     Breath sounds: Normal breath sounds.  Skin:    General: Skin is warm  and dry.  Neurological:     Mental Status: She is alert.      Assessment/Plan: Please see individual problem list.  Hypothyroidism, unspecified type Assessment & Plan: Chronic issue.  Check TSH.  Continue Synthroid 175 mcg daily.  Orders: -     TSH  Type 2 diabetes mellitus without complication, without long-term current use of insulin ([place]) Assessment & Plan: Chronic issue.  Check A1c.  Continue Trulicity 1.5 mg weekly, Jardiance 25 mg daily, metformin XR 1000 mg twice daily.  Refer to endocrinology at patient request.  Orders: -     Hemoglobin A1c -     Microalbumin / creatinine urine ratio -     Ambulatory referral to Endocrinology  Vitamin D deficiency Assessment & Plan: Chronic issue.  She will continue vitamin D 4000 international units daily.  Will check a vitamin D today.  Orders: -     VITAMIN D 25 Hydroxy (Vit-D Deficiency, Fractures)  Anxiety and depression Assessment & Plan: Chronic issue.  Patient declines additional medication for this.  Discussed getting back into therapy if her anxiety remains elevated.  She can continue Xanax 0.25 mg daily as needed for anxiety.  Controlled substance database reviewed.  Refill provided.  Orders: -     ALPRAZolam; Take 1 tablet (0.25 mg total) by mouth daily as needed for anxiety.  Dispense: 30 tablet; Refill: 0  Mild intermittent reactive airway disease without complication Assessment & Plan: Chronic issue.  Occurs seasonally.  We will fill Breo for her to use 1 puff daily.  Orders: -     Fluticasone Furoate-Vilanterol; INHALE 1 PUFF BY MOUTH EVERY DAY  Dispense: 60 each; Refill: 0  Hyperlipidemia, unspecified hyperlipidemia type -     Comprehensive metabolic panel -     Lipid panel  Encounter for screening mammogram for malignant neoplasm of breast -     3D Screening Mammogram, Left and Right; Future     Health Maintenance: Patient will call to schedule her mammogram.  Return in about 3 months (around  03/10/2023) for DM.   Tommi Rumps, MD [place]

## 2022-12-08 NOTE — Assessment & Plan Note (Signed)
Chronic issue.  Occurs seasonally.  We will fill Breo for her to use 1 puff daily.

## 2022-12-08 NOTE — Assessment & Plan Note (Signed)
Chronic issue.  She will continue vitamin D 4000 international units daily.  Will check a vitamin D today.

## 2022-12-09 ENCOUNTER — Other Ambulatory Visit: Payer: Self-pay | Admitting: Family Medicine

## 2022-12-09 ENCOUNTER — Other Ambulatory Visit: Payer: Self-pay

## 2022-12-09 DIAGNOSIS — E039 Hypothyroidism, unspecified: Secondary | ICD-10-CM

## 2022-12-09 MED ORDER — LEVOTHYROXINE SODIUM 150 MCG PO TABS: 150.0000 ug | ORAL_TABLET | Freq: Every day | ORAL | 1 refills | Status: DC

## 2023-01-14 ENCOUNTER — Encounter: Payer: Self-pay | Admitting: Nurse Practitioner

## 2023-01-16 IMAGING — US US PELVIS COMPLETE WITH TRANSVAGINAL
1 series · 15 of 25 positions shown · non-contrast
Comparison: None

CLINICAL DATA: Post menopausal bleeding

EXAM:
TRANSABDOMINAL AND TRANSVAGINAL ULTRASOUND OF PELVIS
TECHNIQUE: Both transabdominal and transvaginal ultrasound examinations of the
pelvis were performed. Transabdominal technique was performed for
global imaging of the pelvis including uterus, ovaries, adnexal
regions, and pelvic cul-de-sac. It was necessary to proceed with
endovaginal exam following the transabdominal exam to visualize the
uterus endometrium ovaries.

[Series 1: us transvaginal non-ob · 103 acquisitions, 15 frames shown]
[im 1/103]
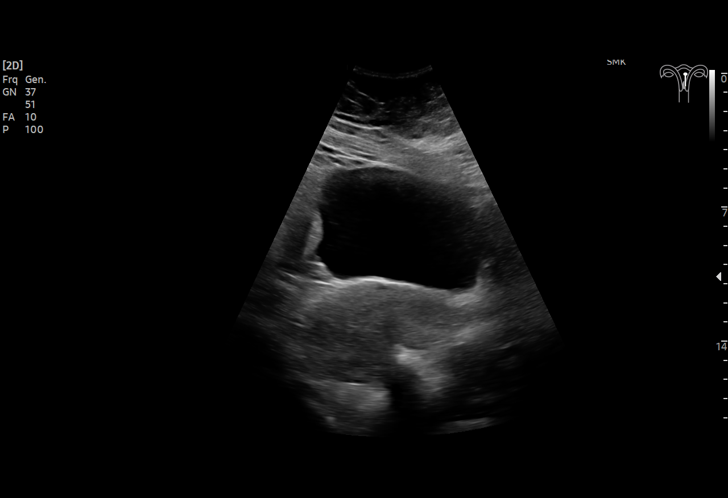
[im 9/103]
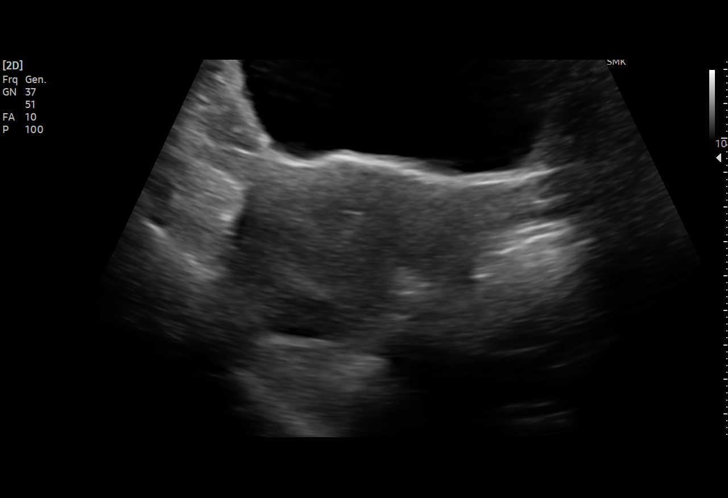
[im 18/103]
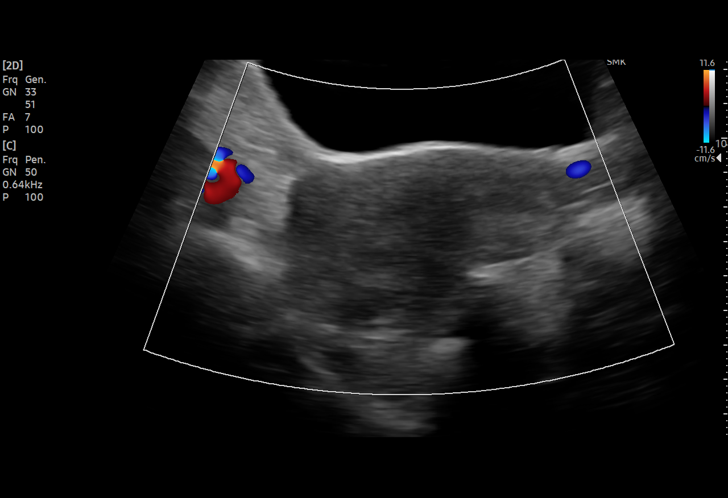
[im 22/103]
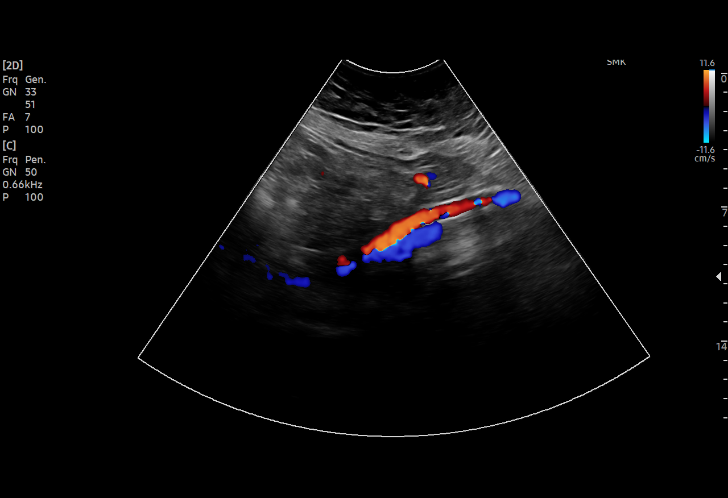
[im 30/103]
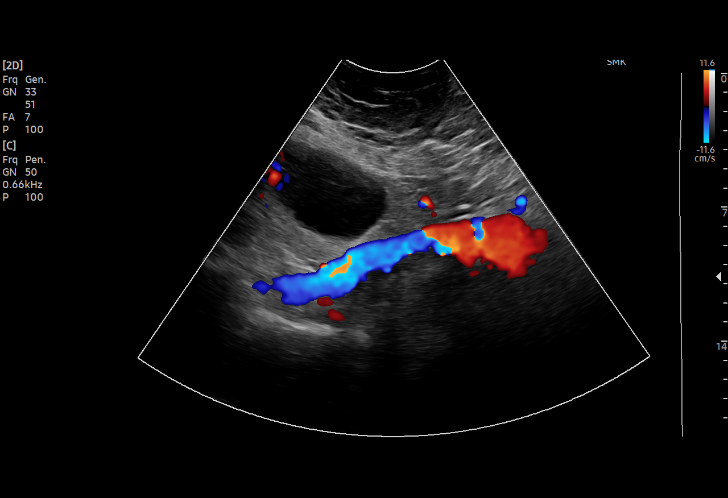
[im 39/103]
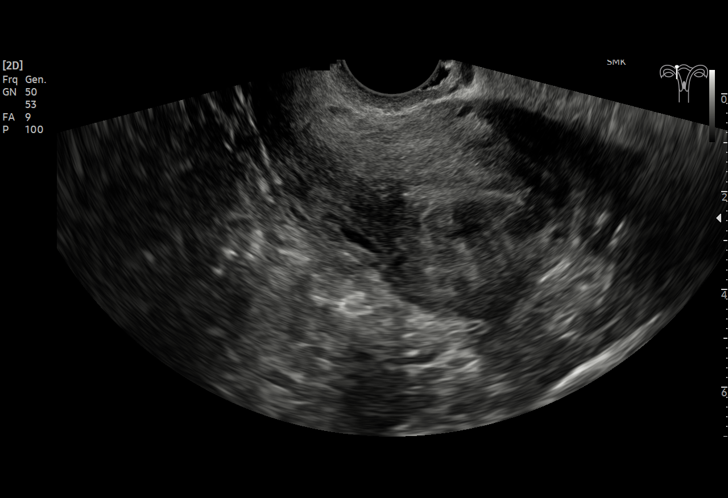
[im 43/103]
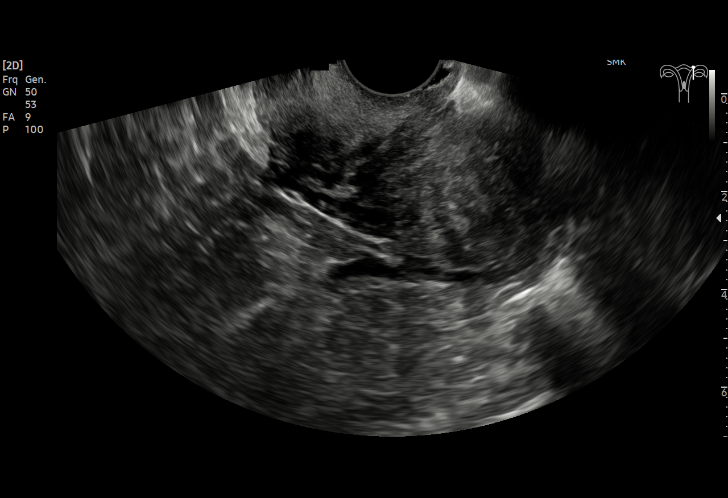
[im 52/103]
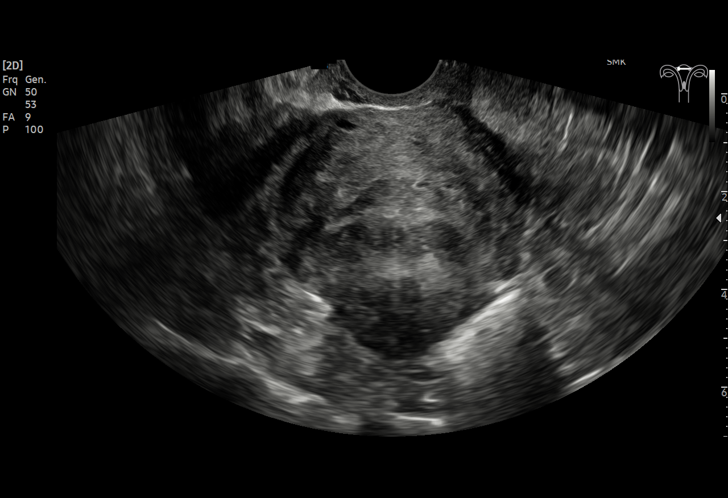
[im 60/103]
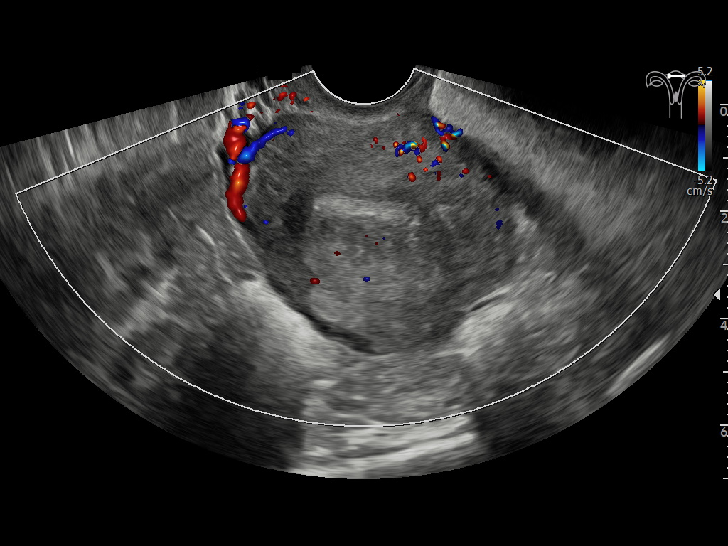
[im 64/103]
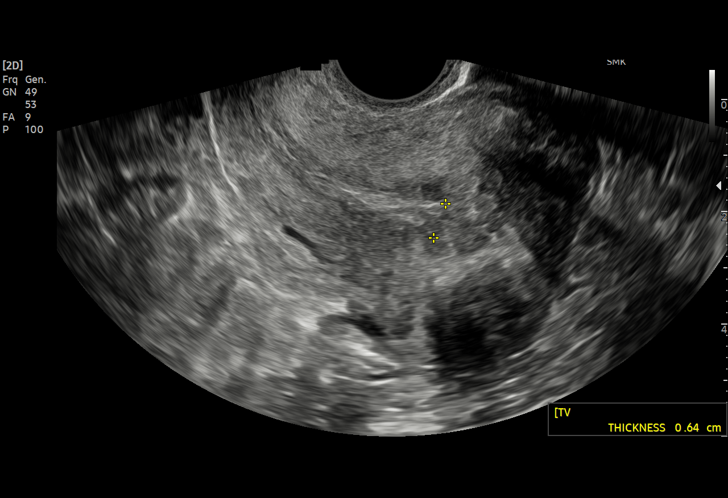
[im 73/103]
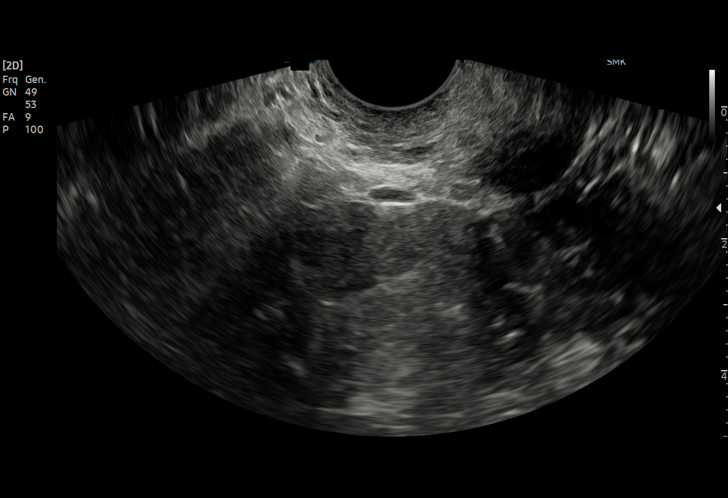
[im 81/103]
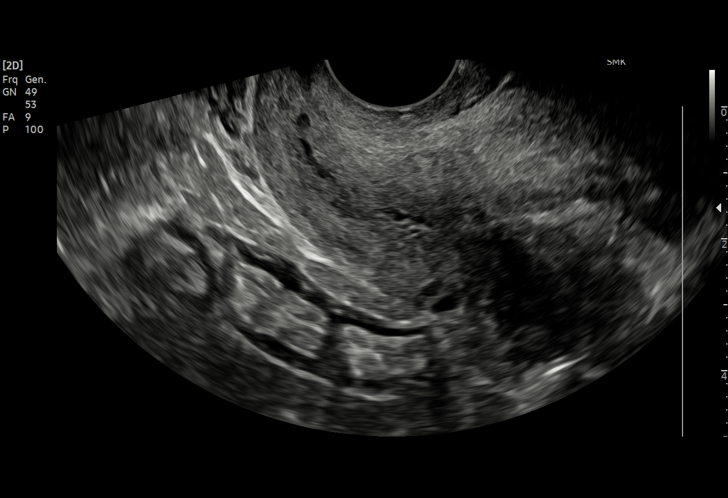
[im 86/103]
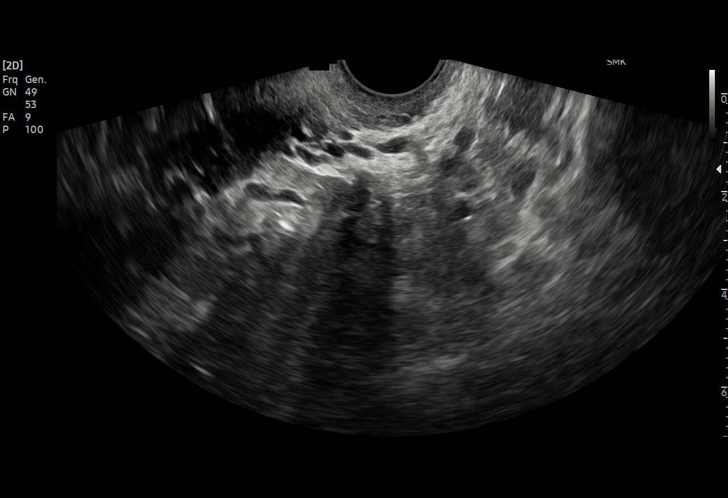
[im 94/103]
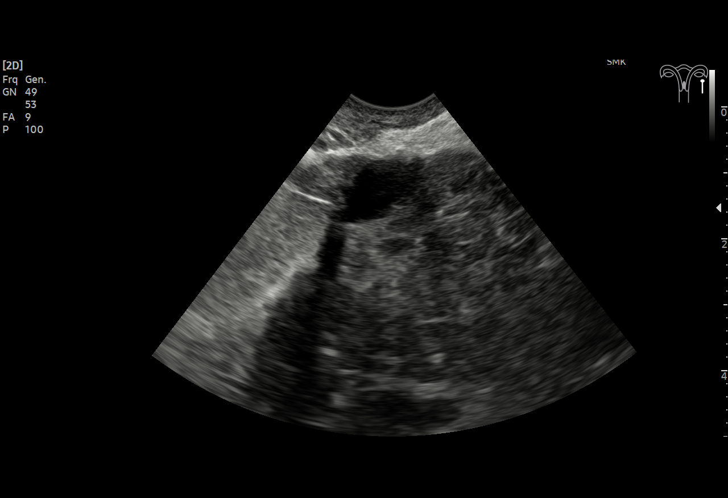
[im 103/103]
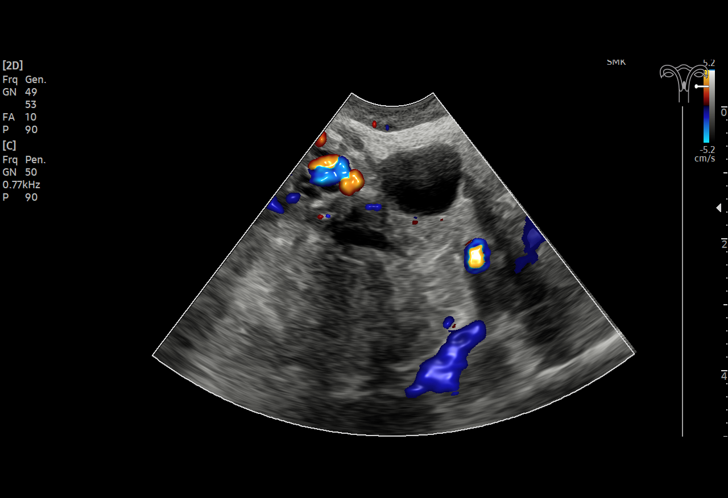

[15 of 25 positions shown; findings below may reference images not displayed]

FINDINGS: Uterus

Measurements: 7.6 x 4.5 x 5.1 cm = volume: 90 mL. Anterior
subserosal fibroid measuring 2.3 x 1.8 x 2.5 cm.

Endometrium

Thickness: 6.4 mm.  No focal abnormality visualized.

Right ovary

Not seen

Left ovary

Measurements: 2.5 x 1.4 x 1.3 cm = volume: 2.4 mL. Normal
appearance/no adnexal mass.

Other findings

No abnormal free fluid.
IMPRESSION: 1. Endometrial thickness of 6.4 mm. In the setting of
post-menopausal bleeding, endometrial sampling is indicated to
exclude carcinoma. If results are benign, sonohysterogram should be
considered for focal lesion work-up. (Ref: Radiological Reasoning:
Algorithmic Workup of Abnormal Vaginal Bleeding with Endovaginal
Sonography and Sonohysterography. AJR [id number]; [id number]:S68-73)
2. Small uterine fibroid

## 2023-01-24 ENCOUNTER — Other Ambulatory Visit: Payer: Self-pay | Admitting: Family Medicine

## 2023-01-24 DIAGNOSIS — E7800 Pure hypercholesterolemia, unspecified: Secondary | ICD-10-CM

## 2023-01-24 DIAGNOSIS — E119 Type 2 diabetes mellitus without complications: Secondary | ICD-10-CM

## 2023-01-26 ENCOUNTER — Other Ambulatory Visit (INDEPENDENT_AMBULATORY_CARE_PROVIDER_SITE_OTHER): Payer: BC Managed Care – PPO

## 2023-01-26 DIAGNOSIS — E039 Hypothyroidism, unspecified: Secondary | ICD-10-CM

## 2023-01-26 LAB — TSH: TSH: 0.45 u[IU]/mL (ref 0.35–5.50)

## 2023-02-25 NOTE — Patient Instructions (Signed)

## 2023-02-26 ENCOUNTER — Encounter: Payer: Self-pay | Admitting: Nurse Practitioner

## 2023-02-26 ENCOUNTER — Ambulatory Visit (INDEPENDENT_AMBULATORY_CARE_PROVIDER_SITE_OTHER): Payer: BC Managed Care – PPO | Admitting: Nurse Practitioner

## 2023-02-26 VITALS — BP 112/78 | HR 71 | Ht 61.0 in | Wt 202.6 lb

## 2023-02-26 DIAGNOSIS — E119 Type 2 diabetes mellitus without complications: Secondary | ICD-10-CM | POA: Diagnosis not present

## 2023-02-26 DIAGNOSIS — E039 Hypothyroidism, unspecified: Secondary | ICD-10-CM | POA: Diagnosis not present

## 2023-02-26 DIAGNOSIS — Z7984 Long term (current) use of oral hypoglycemic drugs: Secondary | ICD-10-CM

## 2023-02-26 DIAGNOSIS — Z7985 Long-term (current) use of injectable non-insulin antidiabetic drugs: Secondary | ICD-10-CM | POA: Diagnosis not present

## 2023-02-26 MED ORDER — METFORMIN HCL ER 500 MG PO TB24: 1000.0000 mg | ORAL_TABLET | Freq: Every day | ORAL | 3 refills | Status: DC

## 2023-02-26 MED ORDER — TRULICITY 1.5 MG/0.5ML ~~LOC~~ SOAJ: 1.5000 mg | SUBCUTANEOUS | 3 refills | Status: DC

## 2023-02-26 NOTE — Progress Notes (Signed)
Endocrinology Consult Note       02/26/2023, 11:59 AM   Subjective:    Patient ID: Tina Moreno, female    DOB: 01-27-1966.  Tina Moreno is being seen in consultation for management of currently uncontrolled symptomatic diabetes requested by  Glori Luis, MD.   Past Medical History:  Diagnosis Date   Abnormal Pap smear of cervix    Anxiety    Asthma    Back pain    Cervical dysplasia    COVID-19 09/05/2021   Depression    Elevated WBCs    NEGATIVE HEM W/U   GERD (gastroesophageal reflux disease)    Hypothyroid     Past Surgical History:  Procedure Laterality Date   COLONOSCOPY WITH PROPOFOL N/A 10/01/2020   Procedure: COLONOSCOPY WITH PROPOFOL;  Surgeon: Toney Reil, MD;  Location: [facility] ENDOSCOPY;  Service: Gastroenterology;  Laterality: N/A;   COLPOSCOPY     GYNECOLOGIC CRYOSURGERY      Social History   Socioeconomic History   Marital status: Single    Spouse name: Not on file   Number of children: Not on file   Years of education: Not on file   Highest education level: Not on file  Occupational History   Not on file  Tobacco Use   Smoking status: Never   Smokeless tobacco: Never  Vaping Use   Vaping Use: Never used  Substance and Sexual Activity   Alcohol use: Yes    Comment: OCCASSIONALLY   Drug use: No   Sexual activity: Not Currently  Other Topics Concern   Not on file  Social History Narrative   Not on file   Social Determinants of Health   Financial Resource Strain: Low Risk  (04/21/2021)   Overall Financial Resource Strain (CARDIA)    Difficulty of Paying Living Expenses: Not hard at all  Food Insecurity: Not on file  Transportation Needs: Not on file  Physical Activity: Not on file  Stress: Not on file  Social Connections: Not on file    Family History  Problem Relation Age of Onset   Parkinsonism Father    Diabetes Father    Hypertension  Father    Cancer Maternal Aunt        SOFT CELL SARCOMA   Cancer Maternal Uncle        TESTICULAR   Cancer Paternal Grandmother        COLON   Cancer Son        TESTICULAR    Outpatient Encounter Medications as of 02/26/2023  Medication Sig   albuterol (VENTOLIN HFA) 108 (90 Base) MCG/ACT inhaler Inhale 2 puffs into the lungs every 6 (six) hours as needed for wheezing or shortness of breath.   ALPRAZolam (XANAX) 0.25 MG tablet Take 1 tablet (0.25 mg total) by mouth daily as needed for anxiety.   atorvastatin (LIPITOR) 40 MG tablet TAKE 1 TABLET BY MOUTH EVERY DAY   carisoprodol (SOMA) 350 MG tablet Take 1 tablet (350 mg total) by mouth 3 (three) times daily as needed for muscle spasms.   esomeprazole (NEXIUM) 20 MG capsule Take 20 mg by mouth daily at 12 noon.   fluticasone (FLONASE) 50  MCG/ACT nasal spray Place 2 sprays into both nostrils daily as needed for allergies or rhinitis.   fluticasone furoate-vilanterol (BREO ELLIPTA) 100-25 MCG/ACT AEPB INHALE 1 PUFF BY MOUTH EVERY DAY   ibuprofen (ADVIL,MOTRIN) 200 MG tablet Take 200 mg by mouth every 6 (six) hours as needed.   levothyroxine (SYNTHROID) 150 MCG tablet Take 1 tablet (150 mcg total) by mouth daily before breakfast.   norethindrone-ethinyl estradiol (FYAVOLV) 1-5 MG-MCG TABS tablet TAKE 1 TABLET BY MOUTH EVERY DAY   [DISCONTINUED] Dulaglutide (TRULICITY) 1.5 MG/0.5ML SOPN Inject 1.5 mg into the skin once a week.   [DISCONTINUED] JARDIANCE 25 MG TABS tablet TAKE 1 TABLET BY MOUTH EVERY DAY BEFORE BREAKFAST   [DISCONTINUED] metFORMIN (GLUCOPHAGE-XR) 500 MG 24 hr tablet TAKE 1 TABLET BY MOUTH TWICE DAILY WITH MEALS FOR ONE WEEK, THEN TAKE 2 TABLETS WITH BREAKFAST AND 1 TABLET WITH DINNER FOR 1 WEEK, THEN TAKE 2 TABLETS TWICE DAILY WITH MEALS ONGOING   Dulaglutide (TRULICITY) 1.5 MG/0.5ML SOPN Inject 1.5 mg into the skin once a week.   fluconazole (DIFLUCAN) 150 MG tablet Take 1 tablet (150 mg total) by mouth every 3 (three) days. For  three doses (Patient not taking: Reported on 02/26/2023)   metFORMIN (GLUCOPHAGE-XR) 500 MG 24 hr tablet Take 2 tablets (1,000 mg total) by mouth daily with breakfast.   No facility-administered encounter medications on file as of 02/26/2023.    ALLERGIES: Allergies  Allergen Reactions   Amoxicillin Other (See Comments)    Dizziness   Other     TREES    VACCINATION STATUS: Immunization History  Administered Date(s) Administered   PFIZER(Purple Top)SARS-COV-2 Vaccination 06/07/2020, 06/28/2020   PNEUMOCOCCAL CONJUGATE-20 04/30/2021   Td 04/30/2021    Diabetes She presents for her initial diabetic visit. She has type 2 diabetes mellitus. Onset time: diagnosed at approx age of 57. Her disease course has been fluctuating. There are no hypoglycemic associated symptoms. Associated symptoms include polyuria (on Jardiance) and weight loss. There are no hypoglycemic complications. There are no diabetic complications. Risk factors for coronary artery disease include diabetes mellitus, family history, obesity, hypertension and sedentary lifestyle. Current diabetic treatment includes oral agent (dual therapy) (and Trulicity). She is compliant with treatment most of the time. Her weight is decreasing steadily. She is following a generally unhealthy diet. When asked about meal planning, she reported none. She has not had a previous visit with a dietitian. She rarely participates in exercise. (She presents today with no meter or logs to review.  Her most recent A1c on 3/19 was 6.9%, improved from previous A1c of 7.2%.  She does not routinely monitor glucose at home (only when she has symptoms).  She drinks mostly water, eats 2 meals per day and snacks throughout (works from home).  She does not exercise routinely.  She is UTD on eye exam, has never been seen by podiatry in the past.) An ACE inhibitor/angiotensin II receptor blocker is not being taken. She does not see a podiatrist.Eye exam is current.      Review of systems  Constitutional: + Minimally fluctuating body weight, current Body mass index is 38.28 kg/m., no fatigue, no subjective hyperthermia, no subjective hypothermia Eyes: no blurry vision, no xerophthalmia ENT: no sore throat, no nodules palpated in throat, no dysphagia/odynophagia, no hoarseness Cardiovascular: no chest pain, no shortness of breath, no palpitations, no leg swelling Respiratory: no cough, no shortness of breath Gastrointestinal: no nausea/vomiting/diarrhea Musculoskeletal: no muscle/joint aches Skin: no rashes, no hyperemia Neurological: no tremors, no numbness,  no tingling, no dizziness Psychiatric: no depression, no anxiety  Objective:     BP 112/78 (BP Location: Left Arm, Patient Position: Sitting, Cuff Size: Large)   Pulse 71   Ht 5\' 1"  (1.549 m)   Wt 202 lb 9.6 oz (91.9 kg)   LMP 06/16/2021 (Approximate)   BMI 38.28 kg/m   Wt Readings from Last 3 Encounters:  02/26/23 202 lb 9.6 oz (91.9 kg)  12/08/22 203 lb 12.8 oz (92.4 kg)  08/19/22 208 lb (94.3 kg)     BP Readings from Last 3 Encounters:  02/26/23 112/78  12/08/22 118/80  08/19/22 114/74     Physical Exam- Limited  Constitutional:  Body mass index is 38.28 kg/m. , not in acute distress, normal state of mind Eyes:  EOMI, no exophthalmos Neck: Supple Thyroid: No gross goiter Cardiovascular: RRR, no murmurs, rubs, or gallops, no edema Respiratory: Adequate breathing efforts, no crackles, rales, rhonchi, or wheezing Musculoskeletal: no gross deformities, strength intact in all four extremities, no gross restriction of joint movements Skin:  no rashes, no hyperemia Neurological: no tremor with outstretched hands   Diabetic Foot Exam - Simple   No data filed     CMP ( most recent) CMP     Component Value Date/Time   NA 143 12/08/2022 0937   NA 143 12/11/2016 0956   K 3.9 12/08/2022 0937   CL 108 12/08/2022 0937   CO2 25 12/08/2022 0937   GLUCOSE 118 (H)  12/08/2022 0937   BUN 10 12/08/2022 0937   BUN 10 12/11/2016 0956   CREATININE 0.52 12/08/2022 0937   CREATININE 0.60 02/23/2014 0822   CALCIUM 9.1 12/08/2022 0937   PROT 6.7 12/08/2022 0937   PROT 7.0 12/11/2016 0956   ALBUMIN 3.8 12/08/2022 0937   ALBUMIN 4.3 12/11/2016 0956   AST 15 12/08/2022 0937   ALT 10 12/08/2022 0937   ALKPHOS 80 12/08/2022 0937   BILITOT 0.3 12/08/2022 0937   BILITOT <0.2 12/11/2016 0956   GFR 105.42 12/08/2022 0937   GFRNONAA 98 12/11/2016 0956     Diabetic Labs (most recent): Lab Results  Component Value Date   HGBA1C 6.9 (H) 12/08/2022   HGBA1C 7.2 (H) 06/09/2022   HGBA1C 6.9 (A) 02/25/2022   MICROALBUR <0.7 12/08/2022   MICROALBUR <0.7 11/24/2021   MICROALBUR 2.9 (H) 08/27/2020     Lipid Panel ( most recent) Lipid Panel     Component Value Date/Time   CHOL 131 12/08/2022 0937   CHOL 207 (H) 12/11/2016 0956   TRIG 128.0 12/08/2022 0937   HDL 38.20 (L) 12/08/2022 0937   HDL 54 12/11/2016 0956   CHOLHDL 3 12/08/2022 0937   VLDL 25.6 12/08/2022 0937   LDLCALC 68 12/08/2022 0937   LDLCALC 124 (H) 12/11/2016 0956   LDLDIRECT 62.0 04/17/2022 0808   LABVLDL 29 12/11/2016 0956      Lab Results  Component Value Date   TSH 0.45 01/26/2023   TSH 0.02 (L) 12/08/2022   TSH 0.90 07/30/2022   TSH 14.46 (H) 06/09/2022   TSH 0.88 11/24/2021   TSH 0.62 04/11/2021   TSH 0.02 (L) 01/28/2021   TSH 0.62 12/11/2020   TSH 80.59 (H) 08/27/2020   TSH 12.88 (H) 01/12/2019           Assessment & Plan:   1) Type 2 diabetes mellitus without complication, without long-term current use of insulin (HCC)  She presents today with no meter or logs to review.  Her most recent A1c on 3/19 was  6.9%, improved from previous A1c of 7.2%.  She does not routinely monitor glucose at home (only when she has symptoms).  She drinks mostly water, eats 2 meals per day and snacks throughout (works from home).  She does not exercise routinely.  She is UTD on eye exam,  has never been seen by podiatry in the past.  - Tina Moreno has currently uncontrolled symptomatic type 2 DM since 57 years of age, with most recent A1c of 6.9 %.   -Recent labs reviewed.  - I had a long discussion with her about the progressive nature of diabetes and the pathology behind its complications. -her diabetes is not currently complicated but she remains at a high risk for more acute and chronic complications which include CAD, CVA, CKD, retinopathy, and neuropathy. These are all discussed in detail with her.  The following Lifestyle Medicine recommendations according to American College of Lifestyle Medicine [facility]) were discussed and offered to patient and she agrees to start the journey:  A. Whole Foods, Plant-based plate comprising of fruits and vegetables, plant-based proteins, whole-grain carbohydrates was discussed in detail with the patient.   A list for source of those nutrients were also provided to the patient.  Patient will use only water or unsweetened tea for hydration. B.  The need to stay away from risky substances including alcohol, smoking; obtaining 7 to 9 hours of restorative sleep, at least 150 minutes of moderate intensity exercise weekly, the importance of healthy social connections,  and stress reduction techniques were discussed. C.  A full color page of  Calorie density of various food groups per pound showing examples of each food groups was provided to the patient.  - I have counseled her on diet and weight management by adopting a carbohydrate restricted/protein rich diet. Patient is encouraged to switch to unprocessed or minimally processed complex starch and increased protein intake (animal or plant source), fruits, and vegetables. -  she is advised to stick to a routine mealtimes to eat 3 meals a day and avoid unnecessary snacks (to snack only to correct hypoglycemia).   - she acknowledges that there is a room for improvement in her food and drink  choices. - Suggestion is made for her to avoid simple carbohydrates from her diet including Cakes, Sweet Desserts, Ice Cream, Soda (diet and regular), Sweet Tea, Candies, Chips, Cookies, Store Bought Juices, Alcohol in Excess of 1-2 drinks a day, Artificial Sweeteners, Coffee Creamer, and "Sugar-free" Products. This will help patient to have more stable blood glucose profile and potentially avoid unintended weight gain.  - I have approached her with the following individualized plan to manage her diabetes and patient agrees:   -she is encouraged to start monitoring glucose 2 times daily, before breakfast and before bed, to log their readings on the clinic sheets provided, and bring them to review at follow up appointment in 3 months.  - Adjustment parameters are given to her for hypo and hyperglycemia in writing. - she is encouraged to call clinic for blood glucose levels less than 70 or above 300 mg /dl. - she is advised to continue Trulicity 1.5 mg SQ weekly, therapeutically suitable for patient . - her [place] Pepper will be discontinued, risk outweighs benefit for this patient (given history of yeast infections in groin and lack of CHF or kidney disease).  I did lower her Metformin to 1000 mg ER daily with breakfast.  - Specific targets for  A1c; LDL, HDL, and Triglycerides were discussed with the patient.  2) Blood Pressure /Hypertension:  her blood pressure is controlled to target without the use of antihypertensive medications.  3) Lipids/Hyperlipidemia:    Review of her recent lipid panel from 12/08/22 showed controlled LDL at 68 .  she is advised to continue Lipitor 40 mg daily at bedtime.  Side effects and precautions discussed with her.  4)  Weight/Diet:  her Body mass index is 38.28 kg/m.  -  clearly complicating her diabetes care.   she is a candidate for weight loss. I discussed with her the fact that loss of 5 - 10% of her  current body weight will have the most impact on her  diabetes management.  Exercise, and detailed carbohydrates information provided  -  detailed on discharge instructions.  5) Hypothyroidism-suspect r/t Hashimotos She has family history of hypothyroidism.  She has never had any imaging of her thyroid in the past.  She is currently on Levothyroxine 150 mcg po daily before breakfast but she does take it with her other medications.  We discussed proper administration as follows:  - The correct intake of thyroid hormone (Levothyroxine, Synthroid), is on empty stomach first thing in the morning, with water, separated by at least 30 minutes from breakfast and other medications,  and separated by more than 4 hours from calcium, iron, multivitamins, acid reflux medications (PPIs).  - This medication is a life-long medication and will be needed to correct thyroid hormone imbalances for the rest of your life.  The dose may change from time to time, based on thyroid blood work.  - It is extremely important to be consistent taking this medication, near the same time each morning.  -AVOID TAKING PRODUCTS CONTAINING BIOTIN (commonly found in Hair, Skin, Nails vitamins) AS IT INTERFERES WITH THE VALIDITY OF THYROID FUNCTION BLOOD TESTS.  She is advised to continue Levothyroxine 150 mcg po daily before breakfast.    6) Chronic Care/Health Maintenance: -she is not on ACEI/ARB and is on Statin medications and is encouraged to initiate and continue to follow up with Ophthalmology, Dentist, Podiatrist at least yearly or according to recommendations, and advised to stay away from smoking. I have recommended yearly flu vaccine and pneumonia vaccine at least every 5 years; moderate intensity exercise for up to 150 minutes weekly; and sleep for at least 7 hours a day.  - she is advised to maintain close follow up with [facility] Yehuda Mao, MD for primary care needs, as well as her other providers for optimal and coordinated care.   - Time spent in this patient care: 60  min, of which > 50% was spent in counseling her about her diabetes and the rest reviewing her blood glucose logs, discussing her hypoglycemia and hyperglycemia episodes, reviewing her current and previous labs/studies (including abstraction from other facilities) and medications doses and developing a long term treatment plan based on the latest standards of care/guidelines; and documenting her care.    Please refer to Patient Instructions for Blood Glucose Monitoring and Insulin/Medications Dosing Guide" in media tab for additional information. Please also refer to "Patient Self Inventory" in the Media tab for reviewed elements of pertinent patient history.  Juleen China Rabadan participated in the discussions, expressed understanding, and voiced agreement with the above plans.  All questions were answered to her satisfaction. she is encouraged to contact clinic should she have any questions or concerns prior to her return visit.     Follow up plan: - Return in about 3 months (around 05/29/2023) for Diabetes F/U with  A1c in office, Thyroid follow up, Previsit labs, Bring meter and logs.    Ronny Bacon, [facility] [address] [place], [place] [address] Phone: [phone] Fax: [phone]  02/26/2023, 11:59 AM

## 2023-03-10 ENCOUNTER — Ambulatory Visit: Payer: BC Managed Care – PPO | Admitting: Family Medicine

## 2023-03-10 ENCOUNTER — Encounter: Payer: Self-pay | Admitting: Family Medicine

## 2023-03-10 VITALS — BP 118/78 | HR 86 | Temp 98.2°F | Ht 61.0 in | Wt 204.4 lb

## 2023-03-10 DIAGNOSIS — Z7985 Long-term (current) use of injectable non-insulin antidiabetic drugs: Secondary | ICD-10-CM

## 2023-03-10 DIAGNOSIS — Z7984 Long term (current) use of oral hypoglycemic drugs: Secondary | ICD-10-CM

## 2023-03-10 DIAGNOSIS — E119 Type 2 diabetes mellitus without complications: Secondary | ICD-10-CM | POA: Diagnosis not present

## 2023-03-10 DIAGNOSIS — E7800 Pure hypercholesterolemia, unspecified: Secondary | ICD-10-CM | POA: Diagnosis not present

## 2023-03-10 LAB — POCT GLYCOSYLATED HEMOGLOBIN (HGB A1C): Hemoglobin A1C: 6.6 % — AB (ref 4.0–5.6)

## 2023-03-10 NOTE — Assessment & Plan Note (Addendum)
Chronic issue.  Previous A1c is adequately controlled.  She saw endocrinology and they stopped Jardiance given yeast infection issues and reduced metformin grams daily.  Sugars have gone up since these medication changes were completed.  Discussed breakfast options including eggs, oatmeal with peanut butter or berries, or Greek yogurt.  Discussed adding in exercise 2 to 3 days a week for 20 to 30 minutes to start with and then can build up from there.  A1c today is adequately controlled.  Prior to the A1c result coming back today we did discuss increasing her Trulicity though given her adequately controlled A1c we will hold off on that at this time.  She will continue Trulicity 1.5 mg weekly and metformin XR 1000 mg daily.

## 2023-03-10 NOTE — Progress Notes (Signed)
Tina Alar, MD Phone: [phone]  Tina Moreno is a 57 y.o. female who presents today for f/u.  DIABETES Disease Monitoring: Blood Sugar ranges-180-220 Polyuria/phagia/dipsia- notes some polydipsia      Optho- UTD Medications: Compliance- taking trulicity, metformin, notes endocrinology stopped jardiance and reduced the metformin dose Hypoglycemic symptoms- no She notes endocrinology wanted her to work on diet changes.  She tries to eat vegetables though mostly gets that through salads with lettuce, peppers, cucumbers, and tomatoes.  She does try to watch overstressing she puts on salads.  She noted endocrinology wanted her to do breakfast lunch and dinner.  She has a hard time getting an breakfast.  Patient notes no exercise.  She does note she just bought a bike to ride outside.  HYPERLIPIDEMIA Symptoms Chest pain on exertion:  no   Leg claudication:   no Medications: Compliance- taking lipitor Right upper quadrant pain- no  Muscle aches- no Lipid Panel     Component Value Date/Time   CHOL 131 12/08/2022 0937   CHOL 207 (H) 12/11/2016 0956   TRIG 128.0 12/08/2022 0937   HDL 38.20 (L) 12/08/2022 0937   HDL 54 12/11/2016 0956   CHOLHDL 3 12/08/2022 0937   VLDL 25.6 12/08/2022 0937   LDLCALC 68 12/08/2022 0937   LDLCALC 124 (H) 12/11/2016 0956   LDLDIRECT 62.0 04/17/2022 0808   LABVLDL 29 12/11/2016 0956       Social History   Tobacco Use  Smoking Status Never  Smokeless Tobacco Never    Current Outpatient Medications on File Prior to Visit  Medication Sig Dispense Refill   albuterol (VENTOLIN HFA) 108 (90 Base) MCG/ACT inhaler Inhale 2 puffs into the lungs every 6 (six) hours as needed for wheezing or shortness of breath. 1 each 2   ALPRAZolam (XANAX) 0.25 MG tablet Take 1 tablet (0.25 mg total) by mouth daily as needed for anxiety. 30 tablet 0   atorvastatin (LIPITOR) 40 MG tablet TAKE 1 TABLET BY MOUTH EVERY DAY 90 tablet 3   carisoprodol (SOMA) 350 MG  tablet Take 1 tablet (350 mg total) by mouth 3 (three) times daily as needed for muscle spasms. 30 tablet 0   Dulaglutide (TRULICITY) 1.5 MG/0.5ML SOPN Inject 1.5 mg into the skin once a week. 6 mL 3   esomeprazole (NEXIUM) 20 MG capsule Take 20 mg by mouth daily at 12 noon.     fluconazole (DIFLUCAN) 150 MG tablet Take 1 tablet (150 mg total) by mouth every 3 (three) days. For three doses 3 tablet 0   fluticasone (FLONASE) 50 MCG/ACT nasal spray Place 2 sprays into both nostrils daily as needed for allergies or rhinitis. 16 g 11   fluticasone furoate-vilanterol (BREO ELLIPTA) 100-25 MCG/ACT AEPB INHALE 1 PUFF BY MOUTH EVERY DAY 60 each 0   ibuprofen (ADVIL,MOTRIN) 200 MG tablet Take 200 mg by mouth every 6 (six) hours as needed.     levothyroxine (SYNTHROID) 150 MCG tablet Take 1 tablet (150 mcg total) by mouth daily before breakfast. 90 tablet 1   metFORMIN (GLUCOPHAGE-XR) 500 MG 24 hr tablet Take 2 tablets (1,000 mg total) by mouth daily with breakfast. 180 tablet 3   norethindrone-ethinyl estradiol (FYAVOLV) 1-5 MG-MCG TABS tablet TAKE 1 TABLET BY MOUTH EVERY DAY 84 tablet 3   No current facility-administered medications on file prior to visit.     ROS see history of present illness  Objective  Physical Exam Vitals:   03/10/23 0837  BP: 118/78  Pulse: 86  Temp:  98.2 F (36.8 C)  SpO2: 97%    BP Readings from Last 3 Encounters:  03/10/23 118/78  02/26/23 112/78  12/08/22 118/80   Wt Readings from Last 3 Encounters:  03/10/23 204 lb 6.4 oz (92.7 kg)  02/26/23 202 lb 9.6 oz (91.9 kg)  12/08/22 203 lb 12.8 oz (92.4 kg)    Physical Exam Constitutional:      General: She is not in acute distress.    Appearance: She is not diaphoretic.  Cardiovascular:     Rate and Rhythm: Normal rate and regular rhythm.     Heart sounds: Normal heart sounds.  Pulmonary:     Effort: Pulmonary effort is normal.     Breath sounds: Normal breath sounds.  Skin:    General: Skin is warm and  dry.  Neurological:     Mental Status: She is alert.      Assessment/Plan: Please see individual problem list.  Type 2 diabetes mellitus without complication, without long-term current use of insulin (HCC) Assessment & Plan: Chronic issue.  Previous A1c is adequately controlled.  She saw endocrinology and they stopped Jardiance given yeast infection issues and reduced metformin grams daily.  Sugars have gone up since these medication changes were completed.  Discussed breakfast options including eggs, oatmeal with peanut butter or berries, or Greek yogurt.  Discussed adding in exercise 2 to 3 days a week for 20 to 30 minutes to start with and then can build up from there.  A1c today is adequately controlled.  Prior to the A1c result coming back today we did discuss increasing her Trulicity though given her adequately controlled A1c we will hold off on that at this time.  She will continue Trulicity 1.5 mg weekly and metformin XR 1000 mg daily.  Orders: -     POCT glycosylated hemoglobin (Hb A1C)  Elevated LDL cholesterol level Assessment & Plan: Chronic issue.  Recent LDL adequately controlled.  She will continue Lipitor 40 mg daily.      Return in about 6 months (around 09/09/2023).   Tina Alar, MD [facility]

## 2023-03-10 NOTE — Patient Instructions (Addendum)
Nice to see you. Please try to ride your bike 2 to 3 days a week for 20 to 30 minutes at a time. Please try to add in breakfast as we discussed and try to add in more vegetables. Please continue to check your blood sugar in the morning before you eat.  Please also try to check it 2 hours after eating one of your meals.

## 2023-03-10 NOTE — Assessment & Plan Note (Signed)
Chronic issue.  Recent LDL adequately controlled.  She will continue Lipitor 40 mg daily.

## 2023-05-28 DIAGNOSIS — E119 Type 2 diabetes mellitus without complications: Secondary | ICD-10-CM | POA: Diagnosis not present

## 2023-05-28 DIAGNOSIS — E039 Hypothyroidism, unspecified: Secondary | ICD-10-CM | POA: Diagnosis not present

## 2023-05-29 LAB — T4, FREE: Free T4: 1.23 ng/dL (ref 0.82–1.77)

## 2023-05-29 LAB — COMPREHENSIVE METABOLIC PANEL: BUN/Creatinine Ratio: 15 (ref 9–23)

## 2023-06-08 ENCOUNTER — Ambulatory Visit (INDEPENDENT_AMBULATORY_CARE_PROVIDER_SITE_OTHER): Payer: BC Managed Care – PPO | Admitting: Nurse Practitioner

## 2023-06-08 ENCOUNTER — Encounter: Payer: Self-pay | Admitting: Nurse Practitioner

## 2023-06-08 VITALS — BP 120/76 | HR 82 | Ht 61.0 in | Wt 217.4 lb

## 2023-06-08 DIAGNOSIS — Z7984 Long term (current) use of oral hypoglycemic drugs: Secondary | ICD-10-CM | POA: Diagnosis not present

## 2023-06-08 DIAGNOSIS — Z7985 Long-term (current) use of injectable non-insulin antidiabetic drugs: Secondary | ICD-10-CM

## 2023-06-08 DIAGNOSIS — E119 Type 2 diabetes mellitus without complications: Secondary | ICD-10-CM

## 2023-06-08 DIAGNOSIS — E039 Hypothyroidism, unspecified: Secondary | ICD-10-CM

## 2023-06-08 LAB — POCT GLYCOSYLATED HEMOGLOBIN (HGB A1C): Hemoglobin A1C: 7.6 % — AB (ref 4.0–5.6)

## 2023-06-08 MED ORDER — TIRZEPATIDE 7.5 MG/0.5ML ~~LOC~~ SOAJ: 7.5000 mg | SUBCUTANEOUS | 0 refills | Status: DC

## 2023-06-08 MED ORDER — TRULICITY 1.5 MG/0.5ML ~~LOC~~ SOAJ: 1.5000 mg | SUBCUTANEOUS | 0 refills | Status: DC

## 2023-06-08 NOTE — Progress Notes (Signed)
Endocrinology Consult Note       06/08/2023, 4:25 PM   Subjective:    Patient ID: Tina Moreno, female    DOB: 1966-09-12.  Tina Moreno is being seen in consultation for management of currently uncontrolled symptomatic diabetes requested by  Glori Luis, MD.   Past Medical History:  Diagnosis Date   Abnormal Pap smear of cervix    Anxiety    Asthma    Back pain    Cervical dysplasia    COVID-19 09/05/2021   Depression    Elevated WBCs    NEGATIVE HEM W/U   GERD (gastroesophageal reflux disease)    Hypothyroid     Past Surgical History:  Procedure Laterality Date   COLONOSCOPY WITH PROPOFOL N/A 10/01/2020   Procedure: COLONOSCOPY WITH PROPOFOL;  Surgeon: Toney Reil, MD;  Location: [facility] ENDOSCOPY;  Service: Gastroenterology;  Laterality: N/A;   COLPOSCOPY     GYNECOLOGIC CRYOSURGERY      Social History   Socioeconomic History   Marital status: Single    Spouse name: Not on file   Number of children: Not on file   Years of education: Not on file   Highest education level: Not on file  Occupational History   Not on file  Tobacco Use   Smoking status: Never   Smokeless tobacco: Never  Vaping Use   Vaping status: Never Used  Substance and Sexual Activity   Alcohol use: Yes    Comment: OCCASSIONALLY   Drug use: No   Sexual activity: Not Currently  Other Topics Concern   Not on file  Social History Narrative   Not on file   Social Determinants of Health   Financial Resource Strain: Low Risk  (04/21/2021)   Overall Financial Resource Strain (CARDIA)    Difficulty of Paying Living Expenses: Not hard at all  Food Insecurity: Not on file  Transportation Needs: Not on file  Physical Activity: Not on file  Stress: Not on file  Social Connections: Not on file    Family History  Problem Relation Age of Onset   Parkinsonism Father    Diabetes Father    Hypertension  Father    Cancer Maternal Aunt        SOFT CELL SARCOMA   Cancer Maternal Uncle        TESTICULAR   Cancer Paternal Grandmother        COLON   Cancer Son        TESTICULAR    Outpatient Encounter Medications as of 06/08/2023  Medication Sig   albuterol (VENTOLIN HFA) 108 (90 Base) MCG/ACT inhaler Inhale 2 puffs into the lungs every 6 (six) hours as needed for wheezing or shortness of breath.   ALPRAZolam (XANAX) 0.25 MG tablet Take 1 tablet (0.25 mg total) by mouth daily as needed for anxiety.   atorvastatin (LIPITOR) 40 MG tablet TAKE 1 TABLET BY MOUTH EVERY DAY   carisoprodol (SOMA) 350 MG tablet Take 1 tablet (350 mg total) by mouth 3 (three) times daily as needed for muscle spasms.   Dulaglutide (TRULICITY) 1.5 MG/0.5ML SOPN Inject 1.5 mg into the skin once a week.   esomeprazole (NEXIUM) 20  MG capsule Take 20 mg by mouth daily at 12 noon.   fluconazole (DIFLUCAN) 150 MG tablet Take 1 tablet (150 mg total) by mouth every 3 (three) days. For three doses   fluticasone (FLONASE) 50 MCG/ACT nasal spray Place 2 sprays into both nostrils daily as needed for allergies or rhinitis.   fluticasone furoate-vilanterol (BREO ELLIPTA) 100-25 MCG/ACT AEPB INHALE 1 PUFF BY MOUTH EVERY DAY   ibuprofen (ADVIL,MOTRIN) 200 MG tablet Take 200 mg by mouth every 6 (six) hours as needed.   levothyroxine (SYNTHROID) 150 MCG tablet Take 1 tablet (150 mcg total) by mouth daily before breakfast.   metFORMIN (GLUCOPHAGE-XR) 500 MG 24 hr tablet Take 2 tablets (1,000 mg total) by mouth daily with breakfast.   norethindrone-ethinyl estradiol (FYAVOLV) 1-5 MG-MCG TABS tablet TAKE 1 TABLET BY MOUTH EVERY DAY   [DISCONTINUED] Dulaglutide (TRULICITY) 1.5 MG/0.5ML SOPN Inject 1.5 mg into the skin once a week.   [DISCONTINUED] tirzepatide (MOUNJARO) 7.5 MG/0.5ML Pen Inject 7.5 mg into the skin once a week.   No facility-administered encounter medications on file as of 06/08/2023.    ALLERGIES: Allergies  Allergen  Reactions   Amoxicillin Other (See Comments)    Dizziness   Other     TREES    VACCINATION STATUS: Immunization History  Administered Date(s) Administered   PFIZER(Purple Top)SARS-COV-2 Vaccination 06/07/2020, 06/28/2020   PNEUMOCOCCAL CONJUGATE-20 04/30/2021   Td 04/30/2021    Diabetes She presents for her follow-up diabetic visit. She has type 2 diabetes mellitus. Onset time: diagnosed at approx age of 22. Her disease course has been fluctuating. There are no hypoglycemic associated symptoms. Associated symptoms include fatigue. Pertinent negatives for diabetes include no polyuria (on Jardiance) and no weight loss. There are no hypoglycemic complications. There are no diabetic complications. Risk factors for coronary artery disease include diabetes mellitus, family history, obesity, hypertension and sedentary lifestyle. Current diabetic treatment includes oral agent (monotherapy) (and Trulicity). She is compliant with treatment most of the time. Her weight is increasing steadily. She is following a generally unhealthy diet. When asked about meal planning, she reported none. She has not had a previous visit with a dietitian. She rarely participates in exercise. Her home blood glucose trend is increasing steadily. Her breakfast blood glucose range is generally 140-180 mg/dl. (She presents today with her logs showing above target fasting glycemic profile.  Her POCT A1c today is 7.6%, increasing form last visit of 6.6%.  She has gained a significant amount of weight since last visit ?fluid since we stopped her Jardiance at that time due to yeast/UTI. She has worked on incorporating more of a breakfast option in but notes she does struggle with it.) An ACE inhibitor/angiotensin II receptor blocker is not being taken. She does not see a podiatrist.Eye exam is current.     Review of systems  Constitutional: + increasing body weight, current Body mass index is 41.08 kg/m., no fatigue, no subjective  hyperthermia, no subjective hypothermia Eyes: no blurry vision, no xerophthalmia ENT: no sore throat, no nodules palpated in throat, no dysphagia/odynophagia, no hoarseness Cardiovascular: no chest pain, no shortness of breath, no palpitations, no leg swelling Respiratory: no cough, no shortness of breath Gastrointestinal: no nausea/vomiting/diarrhea Musculoskeletal: no muscle/joint aches Skin: no rashes, no hyperemia Neurological: no tremors, no numbness, no tingling, no dizziness Psychiatric: no depression, no anxiety  Objective:     BP 120/76 (BP Location: Left Arm, Patient Position: Sitting, Cuff Size: Large)   Pulse 82   Ht 5\' 1"  (1.549 m)  Wt 217 lb 6.4 oz (98.6 kg)   LMP 06/16/2021 (Approximate)   BMI 41.08 kg/m   Wt Readings from Last 3 Encounters:  06/08/23 217 lb 6.4 oz (98.6 kg)  03/10/23 204 lb 6.4 oz (92.7 kg)  02/26/23 202 lb 9.6 oz (91.9 kg)     BP Readings from Last 3 Encounters:  06/08/23 120/76  03/10/23 118/78  02/26/23 112/78     Physical Exam- Limited  Constitutional:  Body mass index is 41.08 kg/m. , not in acute distress, normal state of mind Eyes:  EOMI, no exophthalmos Musculoskeletal: no gross deformities, strength intact in all four extremities, no gross restriction of joint movements Skin:  no rashes, no hyperemia Neurological: no tremor with outstretched hands   Diabetic Foot Exam - Simple   No data filed     CMP ( most recent) CMP     Component Value Date/Time   NA 138 05/28/2023 1459   K 4.5 05/28/2023 1459   CL 99 05/28/2023 1459   CO2 20 05/28/2023 1459   GLUCOSE 199 (H) 05/28/2023 1459   GLUCOSE 118 (H) 12/08/2022 0937   BUN 11 05/28/2023 1459   CREATININE 0.71 05/28/2023 1459   CREATININE 0.60 02/23/2014 0822   CALCIUM 9.6 05/28/2023 1459   PROT 7.2 05/28/2023 1459   ALBUMIN 4.4 05/28/2023 1459   AST 24 05/28/2023 1459   ALT 23 05/28/2023 1459   ALKPHOS 114 05/28/2023 1459   BILITOT 0.2 05/28/2023 1459   GFR  105.42 12/08/2022 0937   EGFR 101 05/28/2023 1459   GFRNONAA 98 12/11/2016 0956     Diabetic Labs (most recent): Lab Results  Component Value Date   HGBA1C 7.6 (A) 06/08/2023   HGBA1C 6.6 (A) 03/10/2023   HGBA1C 6.9 (H) 12/08/2022   MICROALBUR <0.7 12/08/2022   MICROALBUR <0.7 11/24/2021   MICROALBUR 2.9 (H) 08/27/2020     Lipid Panel ( most recent) Lipid Panel     Component Value Date/Time   CHOL 131 12/08/2022 0937   CHOL 207 (H) 12/11/2016 0956   TRIG 128.0 12/08/2022 0937   HDL 38.20 (L) 12/08/2022 0937   HDL 54 12/11/2016 0956   CHOLHDL 3 12/08/2022 0937   VLDL 25.6 12/08/2022 0937   LDLCALC 68 12/08/2022 0937   LDLCALC 124 (H) 12/11/2016 0956   LDLDIRECT 62.0 04/17/2022 0808   LABVLDL 29 12/11/2016 0956      Lab Results  Component Value Date   TSH 1.570 05/28/2023   TSH 0.45 01/26/2023   TSH 0.02 (L) 12/08/2022   TSH 0.90 07/30/2022   TSH 14.46 (H) 06/09/2022   TSH 0.88 11/24/2021   TSH 0.62 04/11/2021   TSH 0.02 (L) 01/28/2021   TSH 0.62 12/11/2020   TSH 80.59 (H) 08/27/2020   FREET4 1.23 05/28/2023           Assessment & Plan:   1) Type 2 diabetes mellitus without complication, without long-term current use of insulin (HCC)  She presents today with her logs showing above target fasting glycemic profile.  Her POCT A1c today is 7.6%, increasing form last visit of 6.6%.  She has gained a significant amount of weight since last visit ?fluid since we stopped her Jardiance at that time due to yeast/UTI. She has worked on incorporating more of a breakfast option in but notes she does struggle with it.  - See D Gillentine has currently uncontrolled symptomatic type 2 DM since 57 years of age.  -Recent labs reviewed.  - I had a long  discussion with her about the progressive nature of diabetes and the pathology behind its complications. -her diabetes is not currently complicated but she remains at a high risk for more acute and chronic complications  which include CAD, CVA, CKD, retinopathy, and neuropathy. These are all discussed in detail with her.  The following Lifestyle Medicine recommendations according to American College of Lifestyle Medicine [facility]) were discussed and offered to patient and she agrees to start the journey:  A. Whole Foods, Plant-based plate comprising of fruits and vegetables, plant-based proteins, whole-grain carbohydrates was discussed in detail with the patient.   A list for source of those nutrients were also provided to the patient.  Patient will use only water or unsweetened tea for hydration. B.  The need to stay away from risky substances including alcohol, smoking; obtaining 7 to 9 hours of restorative sleep, at least 150 minutes of moderate intensity exercise weekly, the importance of healthy social connections,  and stress reduction techniques were discussed. C.  A full color page of  Calorie density of various food groups per pound showing examples of each food groups was provided to the patient.  - Nutritional counseling repeated at each appointment due to patients tendency to fall back in to old habits.  - The patient admits there is a room for improvement in their diet and drink choices. -  Suggestion is made for the patient to avoid simple carbohydrates from their diet including Cakes, Sweet Desserts / Pastries, Ice Cream, Soda (diet and regular), Sweet Tea, Candies, Chips, Cookies, Sweet Pastries, Store Bought Juices, Alcohol in Excess of 1-2 drinks a day, Artificial Sweeteners, Coffee Creamer, and "Sugar-free" Products. This will help patient to have stable blood glucose profile and potentially avoid unintended weight gain.   - I encouraged the patient to switch to unprocessed or minimally processed complex starch and increased protein intake (animal or plant source), fruits, and vegetables.   - Patient is advised to stick to a routine mealtimes to eat 3 meals a day and avoid unnecessary snacks (to snack  only to correct hypoglycemia).  - I have approached her with the following individualized plan to manage her diabetes and patient agrees:   -She is advised to continue her Metformin 1000 mg ER po daily with breakfast and Trulicity 1.5 mg SQ weekly.  She is going on vacation to [place] soon so will continue Trulicity while she is away.  But, when she returns, we will change her over to [facility] 7.5 mg SQ weekly which may work better for her.  -she is encouraged to continue monitoring fasting glucose at least 2-3 times per week.  - Adjustment parameters are given to her for hypo and hyperglycemia in writing. - she is encouraged to call clinic for blood glucose levels less than 70 or above 300 mg /dl.  - her [place] Pepper was previously discontinued, risk outweighs benefit for this patient (given history of yeast infections in groin and lack of CHF or kidney disease).   - Specific targets for  A1c; LDL, HDL, and Triglycerides were discussed with the patient.  2) Blood Pressure /Hypertension:  her blood pressure is controlled to target without the use of antihypertensive medications.  3) Lipids/Hyperlipidemia:    Review of her recent lipid panel from 12/08/22 showed controlled LDL at 68 .  she is advised to continue Lipitor 40 mg daily at bedtime.  Side effects and precautions discussed with her.  4)  Weight/Diet:  her Body mass index is 41.08 kg/m.  -  clearly complicating her diabetes care.   she is a candidate for weight loss. I discussed with her the fact that loss of 5 - 10% of her  current body weight will have the most impact on her diabetes management.  Exercise, and detailed carbohydrates information provided  -  detailed on discharge instructions.  5) Hypothyroidism-suspect r/t Hashimotos She has family history of hypothyroidism.  She has never had any imaging of her thyroid in the past.  She is currently on Levothyroxine 150 mcg po daily before breakfast but she does take it with her  other medications.  We discussed proper administration as follows:  - The correct intake of thyroid hormone (Levothyroxine, Synthroid), is on empty stomach first thing in the morning, with water, separated by at least 30 minutes from breakfast and other medications,  and separated by more than 4 hours from calcium, iron, multivitamins, acid reflux medications (PPIs).  - This medication is a life-long medication and will be needed to correct thyroid hormone imbalances for the rest of your life.  The dose may change from time to time, based on thyroid blood work.  - It is extremely important to be consistent taking this medication, near the same time each morning.  -AVOID TAKING PRODUCTS CONTAINING BIOTIN (commonly found in Hair, Skin, Nails vitamins) AS IT INTERFERES WITH THE VALIDITY OF THYROID FUNCTION BLOOD TESTS.  Her previsit TFTs are consistent with appropriate hormone replacement.  She is advised to continue Levothyroxine 150 mcg po daily before breakfast.    6) Chronic Care/Health Maintenance: -she is not on ACEI/ARB and is on Statin medications and is encouraged to initiate and continue to follow up with Ophthalmology, Dentist, Podiatrist at least yearly or according to recommendations, and advised to stay away from smoking. I have recommended yearly flu vaccine and pneumonia vaccine at least every 5 years; moderate intensity exercise for up to 150 minutes weekly; and sleep for at least 7 hours a day.  - she is advised to maintain close follow up with Birdie Sons Yehuda Mao, MD for primary care needs, as well as her other providers for optimal and coordinated care.     I spent  38  minutes in the care of the patient today including review of labs from CMP, Lipids, Thyroid Function, Hematology (current and previous including abstractions from other facilities); face-to-face time discussing  her blood glucose readings/logs, discussing hypoglycemia and hyperglycemia episodes and symptoms,  medications doses, her options of short and long term treatment based on the latest standards of care / guidelines;  discussion about incorporating lifestyle medicine;  and documenting the encounter. Risk reduction counseling performed per USPSTF guidelines to reduce obesity and cardiovascular risk factors.     Please refer to Patient Instructions for Blood Glucose Monitoring and Insulin/Medications Dosing Guide"  in media tab for additional information. Please  also refer to " Patient Self Inventory" in the Media  tab for reviewed elements of pertinent patient history.  Juleen China Violet participated in the discussions, expressed understanding, and voiced agreement with the above plans.  All questions were answered to her satisfaction. she is encouraged to contact clinic should she have any questions or concerns prior to her return visit.     Follow up plan: - Return in about 3 months (around 09/07/2023) for Diabetes F/U with A1c in office, Previsit labs.   Ronny Bacon, [facility] [address] [place], [place] [address] Phone: [phone] Fax: [phone]  06/08/2023, 4:25 PM

## 2023-06-21 ENCOUNTER — Encounter: Payer: Self-pay | Admitting: Family Medicine

## 2023-06-21 DIAGNOSIS — J4521 Mild intermittent asthma with (acute) exacerbation: Secondary | ICD-10-CM

## 2023-06-21 DIAGNOSIS — R059 Cough, unspecified: Secondary | ICD-10-CM

## 2023-06-21 MED ORDER — ALBUTEROL SULFATE HFA 108 (90 BASE) MCG/ACT IN AERS
2.0000 | INHALATION_SPRAY | Freq: Four times a day (QID) | RESPIRATORY_TRACT | 2 refills | Status: AC | PRN
Start: 2023-06-21 — End: ?

## 2023-07-08 ENCOUNTER — Encounter: Payer: Self-pay | Admitting: Nurse Practitioner

## 2023-07-09 MED ORDER — TIRZEPATIDE 5 MG/0.5ML ~~LOC~~ SOAJ: 5.0000 mg | SUBCUTANEOUS | 0 refills | Status: DC

## 2023-07-20 DIAGNOSIS — E119 Type 2 diabetes mellitus without complications: Secondary | ICD-10-CM | POA: Diagnosis not present

## 2023-07-20 DIAGNOSIS — H2513 Age-related nuclear cataract, bilateral: Secondary | ICD-10-CM | POA: Diagnosis not present

## 2023-07-20 DIAGNOSIS — D3132 Benign neoplasm of left choroid: Secondary | ICD-10-CM | POA: Diagnosis not present

## 2023-07-20 DIAGNOSIS — H5213 Myopia, bilateral: Secondary | ICD-10-CM | POA: Diagnosis not present

## 2023-07-20 DIAGNOSIS — H524 Presbyopia: Secondary | ICD-10-CM | POA: Diagnosis not present

## 2023-08-13 ENCOUNTER — Encounter: Payer: Self-pay | Admitting: Nurse Practitioner

## 2023-08-15 ENCOUNTER — Other Ambulatory Visit: Payer: Self-pay | Admitting: Nurse Practitioner

## 2023-08-16 ENCOUNTER — Other Ambulatory Visit: Payer: Self-pay | Admitting: Nurse Practitioner

## 2023-08-16 MED ORDER — TIRZEPATIDE 5 MG/0.5ML ~~LOC~~ SOAJ: 5.0000 mg | SUBCUTANEOUS | 3 refills | Status: DC

## 2023-09-10 ENCOUNTER — Ambulatory Visit: Payer: BC Managed Care – PPO | Admitting: Family Medicine

## 2023-09-10 ENCOUNTER — Encounter: Payer: Self-pay | Admitting: Family Medicine

## 2023-09-10 VITALS — BP 134/84 | HR 101 | Temp 98.8°F | Ht 61.0 in | Wt 219.0 lb

## 2023-09-10 DIAGNOSIS — E039 Hypothyroidism, unspecified: Secondary | ICD-10-CM

## 2023-09-10 DIAGNOSIS — G8929 Other chronic pain: Secondary | ICD-10-CM

## 2023-09-10 DIAGNOSIS — M549 Dorsalgia, unspecified: Secondary | ICD-10-CM | POA: Diagnosis not present

## 2023-09-10 DIAGNOSIS — J309 Allergic rhinitis, unspecified: Secondary | ICD-10-CM | POA: Diagnosis not present

## 2023-09-10 DIAGNOSIS — Z7984 Long term (current) use of oral hypoglycemic drugs: Secondary | ICD-10-CM

## 2023-09-10 DIAGNOSIS — E119 Type 2 diabetes mellitus without complications: Secondary | ICD-10-CM | POA: Diagnosis not present

## 2023-09-10 DIAGNOSIS — Z7985 Long-term (current) use of injectable non-insulin antidiabetic drugs: Secondary | ICD-10-CM

## 2023-09-10 MED ORDER — CARISOPRODOL 350 MG PO TABS
350.0000 mg | ORAL_TABLET | Freq: Three times a day (TID) | ORAL | 0 refills | Status: AC | PRN
Start: 2023-09-10 — End: ?

## 2023-09-10 NOTE — Progress Notes (Signed)
Marikay Alar, MD Phone: [phone]  Tina Moreno is a 57 y.o. female who presents today for f/u.  DIABETES Disease Monitoring: Blood Sugar ranges-high Polyuria/phagia/dipsia- notes polyuria and polydipsia      Medications: Compliance- taking metformin, mounjaro Hypoglycemic symptoms- no  HYPOTHYROIDISM Disease Monitoring Weight changes: no  Skin Changes: no Heat/Cold intolerance: stable  Medication Monitoring Compliance:  taking synthroid   Last TSH:   Lab Results  Component Value Date   TSH 1.570 05/28/2023   Sore throat: Patient notes onset of symptoms about 2 weeks ago.  She has had postnasal drip.  Some cough that is worse in the morning after she gets up.  Possible sick contact a week and a half ago.  No fevers.  No significant congestion.  She is been using DayQuil and NyQuil.  She reports she is a little bit better than she was.  She has tried Flonase on 1-2 occasions for this.  Chronic back pain: Patient notes occasionally this flares up.  She rarely takes Herbalist for this.  It does not make her very drowsy.  She does not drive when she takes this.  She is requesting a refill as it has been over a year since it was last refilled.   Social History   Tobacco Use  Smoking Status Never  Smokeless Tobacco Never    Current Outpatient Medications on File Prior to Visit  Medication Sig Dispense Refill   albuterol (VENTOLIN HFA) 108 (90 Base) MCG/ACT inhaler Inhale 2 puffs into the lungs every 6 (six) hours as needed for wheezing or shortness of breath. 1 each 2   ALPRAZolam (XANAX) 0.25 MG tablet Take 1 tablet (0.25 mg total) by mouth daily as needed for anxiety. 30 tablet 0   atorvastatin (LIPITOR) 40 MG tablet TAKE 1 TABLET BY MOUTH EVERY DAY 90 tablet 3   esomeprazole (NEXIUM) 20 MG capsule Take 20 mg by mouth daily at 12 noon.     fluconazole (DIFLUCAN) 150 MG tablet Take 1 tablet (150 mg total) by mouth every 3 (three) days. For three doses 3 tablet 0    fluticasone (FLONASE) 50 MCG/ACT nasal spray Place 2 sprays into both nostrils daily as needed for allergies or rhinitis. 16 g 11   fluticasone furoate-vilanterol (BREO ELLIPTA) 100-25 MCG/ACT AEPB INHALE 1 PUFF BY MOUTH EVERY DAY 60 each 0   ibuprofen (ADVIL,MOTRIN) 200 MG tablet Take 200 mg by mouth every 6 (six) hours as needed.     levothyroxine (SYNTHROID) 150 MCG tablet Take 1 tablet (150 mcg total) by mouth daily before breakfast. 90 tablet 1   metFORMIN (GLUCOPHAGE-XR) 500 MG 24 hr tablet Take 2 tablets (1,000 mg total) by mouth daily with breakfast. 180 tablet 3   norethindrone-ethinyl estradiol (FYAVOLV) 1-5 MG-MCG TABS tablet TAKE 1 TABLET BY MOUTH EVERY DAY 84 tablet 3   tirzepatide (MOUNJARO) 5 MG/0.5ML Pen Inject 5 mg into the skin once a week. 2 mL 3   No current facility-administered medications on file prior to visit.     ROS see history of present illness  Objective  Physical Exam Vitals:   09/10/23 1411  BP: 134/84  Pulse: (!) 101  Temp: 98.8 F (37.1 C)  SpO2: 97%    BP Readings from Last 3 Encounters:  09/10/23 134/84  06/08/23 120/76  03/10/23 118/78   Wt Readings from Last 3 Encounters:  09/10/23 219 lb (99.3 kg)  06/08/23 217 lb 6.4 oz (98.6 kg)  03/10/23 204 lb 6.4 oz (92.7 kg)  Physical Exam Constitutional:      General: She is not in acute distress.    Appearance: She is not diaphoretic.  HENT:     Head: Normocephalic and atraumatic.     Right Ear: Tympanic membrane and ear canal normal.     Left Ear: Tympanic membrane and ear canal normal.     Mouth/Throat:     Mouth: Mucous membranes are moist.     Pharynx: Oropharynx is clear.  Cardiovascular:     Rate and Rhythm: Normal rate and regular rhythm.     Heart sounds: Normal heart sounds.  Pulmonary:     Effort: Pulmonary effort is normal.     Breath sounds: Normal breath sounds.  Lymphadenopathy:     Cervical: No cervical adenopathy.  Skin:    General: Skin is warm and dry.   Neurological:     Mental Status: She is alert.    Diabetic Foot Exam - Simple   Simple Foot Form Diabetic Foot exam was performed with the following findings: Yes 09/10/2023  2:28 PM  Visual Inspection No deformities, no ulcerations, no other skin breakdown bilaterally: Yes Sensation Testing Intact to touch and monofilament testing bilaterally: Yes Pulse Check Posterior Tibialis and Dorsalis pulse intact bilaterally: Yes Comments      Assessment/Plan: Please see individual problem list.  Hypothyroidism, unspecified type Assessment & Plan: Chronic issue.  Continue Synthroid 175 mcg daily.   Chronic bilateral back pain, unspecified back location Assessment & Plan: Chronic intermittent issue.  Soma refilled to use sparingly.  Orders: -     Carisoprodol; Take 1 tablet (350 mg total) by mouth 3 (three) times daily as needed for muscle spasms.  Dispense: 30 tablet; Refill: 0  Type 2 diabetes mellitus without complication, without long-term current use of insulin (HCC) Assessment & Plan: Chronic issue.  Seems poorly controlled based on home CBG report.  She will see endocrinology as planned.  Discussed they would likely increase the [facility].  For now she will continue Mounjaro 5 mg weekly and metformin XR 1000 mg daily.   Allergic rhinitis, unspecified seasonality, unspecified trigger Assessment & Plan: Chronic issue.  Suspect current symptoms are related to allergies versus viral illness.  Discussed treatment with Flonase 2 sprays each nostril daily and adding Claritin or Zyrtec over-the-counter.  If not improving over the next week she will let us know.      Health Maintenance: Patient will have her ophthalmology office send [place] her last office note.  Return in about 6 months (around 03/10/2024) for transfer of care.   Marikay Alar, MD [facility]

## 2023-09-10 NOTE — Assessment & Plan Note (Signed)
Chronic issue.  Suspect current symptoms are related to allergies versus viral illness.  Discussed treatment with Flonase 2 sprays each nostril daily and adding Claritin or Zyrtec over-the-counter.  If not improving over the next week she will let us know.

## 2023-09-10 NOTE — Assessment & Plan Note (Signed)
Chronic issue.  Seems poorly controlled based on home CBG report.  She will see endocrinology as planned.  Discussed they would likely increase the [facility].  For now she will continue Mounjaro 5 mg weekly and metformin XR 1000 mg daily.

## 2023-09-10 NOTE — Assessment & Plan Note (Signed)
Chronic intermittent issue.  Soma refilled to use sparingly.

## 2023-09-10 NOTE — Assessment & Plan Note (Signed)
Chronic issue.  Continue Synthroid 175 mcg daily.

## 2023-09-20 ENCOUNTER — Encounter: Payer: Self-pay | Admitting: Nurse Practitioner

## 2023-09-20 MED ORDER — TIRZEPATIDE 7.5 MG/0.5ML ~~LOC~~ SOAJ: 7.5000 mg | SUBCUTANEOUS | 1 refills | Status: DC

## 2023-09-29 ENCOUNTER — Ambulatory Visit (INDEPENDENT_AMBULATORY_CARE_PROVIDER_SITE_OTHER): Payer: BC Managed Care – PPO | Admitting: Nurse Practitioner

## 2023-09-29 ENCOUNTER — Encounter: Payer: Self-pay | Admitting: Nurse Practitioner

## 2023-09-29 VITALS — BP 125/83 | HR 97 | Ht 61.0 in | Wt 224.4 lb

## 2023-09-29 DIAGNOSIS — Z7984 Long term (current) use of oral hypoglycemic drugs: Secondary | ICD-10-CM | POA: Diagnosis not present

## 2023-09-29 DIAGNOSIS — E119 Type 2 diabetes mellitus without complications: Secondary | ICD-10-CM | POA: Diagnosis not present

## 2023-09-29 DIAGNOSIS — E039 Hypothyroidism, unspecified: Secondary | ICD-10-CM

## 2023-09-29 DIAGNOSIS — R635 Abnormal weight gain: Secondary | ICD-10-CM

## 2023-09-29 DIAGNOSIS — Z7985 Long-term (current) use of injectable non-insulin antidiabetic drugs: Secondary | ICD-10-CM

## 2023-09-29 LAB — POCT GLYCOSYLATED HEMOGLOBIN (HGB A1C): Hemoglobin A1C: 9.7 % — AB (ref 4.0–5.6)

## 2023-09-29 MED ORDER — LEVOTHYROXINE SODIUM 150 MCG PO TABS: 150.0000 ug | ORAL_TABLET | Freq: Every day | ORAL | 1 refills | Status: DC

## 2023-09-29 NOTE — Progress Notes (Signed)
 Endocrinology Follow Up Note       09/29/2023, 4:33 PM   Subjective:    Patient ID: Tina Moreno, female    DOB: [birth year].  Tina Moreno is being seen in consultation for management of currently uncontrolled symptomatic diabetes requested by  Maribeth Camellia MATSU, MD.   Past Medical History:  Diagnosis Date   Abnormal Pap smear of cervix    Anxiety    Asthma    Back pain    Cervical dysplasia    COVID-19 09/05/2021   Depression    Elevated WBCs    NEGATIVE HEM W/U   GERD (gastroesophageal reflux disease)    Hypothyroid     Past Surgical History:  Procedure Laterality Date   COLONOSCOPY WITH PROPOFOL  N/A 10/01/2020   Procedure: COLONOSCOPY WITH PROPOFOL ;  Surgeon: Unk Corinn Skiff, MD;  Location: [facility] ENDOSCOPY;  Service: Gastroenterology;  Laterality: N/A;   COLPOSCOPY     GYNECOLOGIC CRYOSURGERY      Social History   Socioeconomic History   Marital status: Single    Spouse name: Not on file   Number of children: Not on file   Years of education: Not on file   Highest education level: Bachelor's degree (e.g., BA, AB, BS)  Occupational History   Not on file  Tobacco Use   Smoking status: Never   Smokeless tobacco: Never  Vaping Use   Vaping status: Never Used  Substance and Sexual Activity   Alcohol use: Yes    Comment: OCCASSIONALLY   Drug use: No   Sexual activity: Not Currently  Other Topics Concern   Not on file  Social History Narrative   Not on file   Social Drivers of Health   Financial Resource Strain: Medium Risk (09/09/2023)   Overall Financial Resource Strain (CARDIA)    Difficulty of Paying Living Expenses: Somewhat hard  Food Insecurity: No Food Insecurity (09/09/2023)   Hunger Vital Sign    Worried About Running Out of Food in the Last Year: Never true    Ran Out of Food in the Last Year: Never true  Transportation Needs: No Transportation Needs  (09/09/2023)   PRAPARE - Administrator, Civil Service (Medical): No    Lack of Transportation (Non-Medical): No  Physical Activity: Insufficiently Active (09/09/2023)   Exercise Vital Sign    Days of Exercise per Week: 1 day    Minutes of Exercise per Session: 20 min  Stress: No Stress Concern Present (09/09/2023)   [employer] of Occupational Health - Occupational Stress Questionnaire    Feeling of Stress : Only a little  Social Connections: Socially Isolated (09/09/2023)   Social Connection and Isolation Panel [NHANES]    Frequency of Communication with Friends and Family: Once a week    Frequency of Social Gatherings with Friends and Family: Once a week    Attends Religious Services: Never    Database Administrator or Organizations: No    Attends Engineer, Structural: Not on file    Marital Status: Never married    Family History  Problem Relation Age of Onset   Parkinsonism Father    Diabetes Father  Hypertension Father    Cancer Maternal Aunt        SOFT CELL SARCOMA   Cancer Maternal Uncle        TESTICULAR   Cancer Paternal Grandmother        COLON   Cancer Son        TESTICULAR    Outpatient Encounter Medications as of 09/29/2023  Medication Sig   albuterol  (VENTOLIN  HFA) 108 (90 Base) MCG/ACT inhaler Inhale 2 puffs into the lungs every 6 (six) hours as needed for wheezing or shortness of breath.   ALPRAZolam  (XANAX ) 0.25 MG tablet Take 1 tablet (0.25 mg total) by mouth daily as needed for anxiety.   atorvastatin  (LIPITOR) 40 MG tablet TAKE 1 TABLET BY MOUTH EVERY DAY   carisoprodol  (SOMA ) 350 MG tablet Take 1 tablet (350 mg total) by mouth 3 (three) times daily as needed for muscle spasms.   esomeprazole (NEXIUM) 20 MG capsule Take 20 mg by mouth daily at 12 noon.   fluticasone  (FLONASE ) 50 MCG/ACT nasal spray Place 2 sprays into both nostrils daily as needed for allergies or rhinitis.   fluticasone  furoate-vilanterol (BREO ELLIPTA )  100-25 MCG/ACT AEPB INHALE 1 PUFF BY MOUTH EVERY DAY   ibuprofen (ADVIL,MOTRIN) 200 MG tablet Take 200 mg by mouth every 6 (six) hours as needed.   metFORMIN  (GLUCOPHAGE -XR) 500 MG 24 hr tablet Take 2 tablets (1,000 mg total) by mouth daily with breakfast.   norethindrone -ethinyl estradiol  (FYAVOLV) 1-5 MG-MCG TABS tablet TAKE 1 TABLET BY MOUTH EVERY DAY   tirzepatide  (MOUNJARO ) 7.5 MG/0.5ML Pen Inject 7.5 mg into the skin once a week.   [DISCONTINUED] levothyroxine  (SYNTHROID ) 150 MCG tablet Take 1 tablet (150 mcg total) by mouth daily before breakfast.   levothyroxine  (SYNTHROID ) 150 MCG tablet Take 1 tablet (150 mcg total) by mouth daily before breakfast.   [DISCONTINUED] fluconazole  (DIFLUCAN ) 150 MG tablet Take 1 tablet (150 mg total) by mouth every 3 (three) days. For three doses   No facility-administered encounter medications on file as of 09/29/2023.    ALLERGIES: Allergies  Allergen Reactions   Amoxicillin Other (See Comments)    Dizziness   Other     TREES    VACCINATION STATUS: Immunization History  Administered Date(s) Administered   PFIZER(Purple Top)SARS-COV-2 Vaccination 06/07/2020, 06/28/2020   PNEUMOCOCCAL CONJUGATE-20 04/30/2021   Td 04/30/2021    Diabetes She presents for her follow-up diabetic visit. She has type 2 diabetes mellitus. Onset time: diagnosed at approx age of 40. Her disease course has been worsening. There are no hypoglycemic associated symptoms. Associated symptoms include fatigue. Pertinent negatives for diabetes include no polyuria (on Jardiance ) and no weight loss. There are no hypoglycemic complications. There are no diabetic complications. Risk factors for coronary artery disease include diabetes mellitus, family history, obesity, hypertension and sedentary lifestyle. Current diabetic treatment includes oral agent (monotherapy) (and Mounjaro ). She is compliant with treatment most of the time. Her weight is increasing rapidly. She is following a  generally unhealthy diet. When asked about meal planning, she reported none. She has not had a previous visit with a dietitian. She rarely participates in exercise. Her home blood glucose trend is increasing steadily. Her overall blood glucose range is >200 mg/dl. (She presents today with no meter or logs to review.  She was not asked to routinely monitor glucose due to safe regimen.  Her POCT A1c today is 9.7%, increasing from last visit of 7.6%.  She just started her higher dose of Mounjaro  this week, notes  some nausea and gas with it but tolerable thus far.  ) An ACE inhibitor/angiotensin II receptor blocker is not being taken. She does not see a podiatrist.Eye exam is current.     Review of systems  Constitutional: + increasing body weight, current Body mass index is 42.4 kg/m., no fatigue, no subjective hyperthermia, no subjective hypothermia Eyes: no blurry vision, no xerophthalmia ENT: no sore throat, no nodules palpated in throat, no dysphagia/odynophagia, no hoarseness Cardiovascular: no chest pain, no shortness of breath, no palpitations, no leg swelling Respiratory: no cough, no shortness of breath Gastrointestinal: + intermittent nausea and gas (r/t Mounjaro  injections) Musculoskeletal: no muscle/joint aches Skin: no rashes, no hyperemia, + brittle nails Neurological: no tremors, no numbness, no tingling, no dizziness Psychiatric: no depression, no anxiety  Objective:     BP 125/83 (BP Location: Left Arm, Patient Position: Sitting, Cuff Size: Large)   Pulse 97   Ht 5' 1 (1.549 m)   Wt 224 lb 6.4 oz (101.8 kg)   LMP 06/16/2021 (Approximate)   BMI 42.40 kg/m   Wt Readings from Last 3 Encounters:  09/29/23 224 lb 6.4 oz (101.8 kg)  09/10/23 219 lb (99.3 kg)  06/08/23 217 lb 6.4 oz (98.6 kg)     BP Readings from Last 3 Encounters:  09/29/23 125/83  09/10/23 134/84  06/08/23 120/76      Physical Exam- Limited  Constitutional:  Body mass index is 42.4 kg/m. , not in  acute distress, normal state of mind Eyes:  EOMI, no exophthalmos Musculoskeletal: no gross deformities, strength intact in all four extremities, no gross restriction of joint movements Skin:  no rashes, no hyperemia Neurological: no tremor with outstretched hands   Diabetic Foot Exam - Simple   No data filed     CMP ( most recent) CMP     Component Value Date/Time   NA 138 05/28/2023 1459   K 4.5 05/28/2023 1459   CL 99 05/28/2023 1459   CO2 20 05/28/2023 1459   GLUCOSE 199 (H) 05/28/2023 1459   GLUCOSE 118 (H) 12/08/2022 0937   BUN 11 05/28/2023 1459   CREATININE 0.71 05/28/2023 1459   CREATININE 0.60 02/23/2014 0822   CALCIUM  9.6 05/28/2023 1459   PROT 7.2 05/28/2023 1459   ALBUMIN 4.4 05/28/2023 1459   AST 24 05/28/2023 1459   ALT 23 05/28/2023 1459   ALKPHOS 114 05/28/2023 1459   BILITOT 0.2 05/28/2023 1459   GFR 105.42 12/08/2022 0937   EGFR 101 05/28/2023 1459   GFRNONAA 98 12/11/2016 0956     Diabetic Labs (most recent): Lab Results  Component Value Date   HGBA1C 9.7 (A) 09/29/2023   HGBA1C 7.6 (A) 06/08/2023   HGBA1C 6.6 (A) 03/10/2023   MICROALBUR <0.7 12/08/2022   MICROALBUR <0.7 11/24/2021   MICROALBUR 2.9 (H) 08/27/2020     Lipid Panel ( most recent) Lipid Panel     Component Value Date/Time   CHOL 131 12/08/2022 0937   CHOL 207 (H) 12/11/2016 0956   TRIG 128.0 12/08/2022 0937   HDL 38.20 (L) 12/08/2022 0937   HDL 54 12/11/2016 0956   CHOLHDL 3 12/08/2022 0937   VLDL 25.6 12/08/2022 0937   LDLCALC 68 12/08/2022 0937   LDLCALC 124 (H) 12/11/2016 0956   LDLDIRECT 62.0 04/17/2022 0808   LABVLDL 29 12/11/2016 0956      Lab Results  Component Value Date   TSH 1.570 05/28/2023   TSH 0.45 01/26/2023   TSH 0.02 (L) 12/08/2022   TSH 0.90  07/30/2022   TSH 14.46 (H) 06/09/2022   TSH 0.88 11/24/2021   TSH 0.62 04/11/2021   TSH 0.02 (L) 01/28/2021   TSH 0.62 12/11/2020   TSH 80.59 (H) 08/27/2020   FREET4 1.23 05/28/2023            Assessment & Plan:   1) Type 2 diabetes mellitus without complication, without long-term current use of insulin (HCC)  She presents today with no meter or logs to review.  She was not asked to routinely monitor glucose due to safe regimen.  Her POCT A1c today is 9.7%, increasing from last visit of 7.6%.  She just started her higher dose of Mounjaro  this week, notes some nausea and gas with it but tolerable thus far.    - Tina Moreno has currently uncontrolled symptomatic type 2 DM since 58 years of age.  -Recent labs reviewed.  - I had a long discussion with her about the progressive nature of diabetes and the pathology behind its complications. -her diabetes is not currently complicated but she remains at a high risk for more acute and chronic complications which include CAD, CVA, CKD, retinopathy, and neuropathy. These are all discussed in detail with her.  The following Lifestyle Medicine recommendations according to American College of Lifestyle Medicine [facility]) were discussed and offered to patient and she agrees to start the journey:  A. Whole Foods, Plant-based plate comprising of fruits and vegetables, plant-based proteins, whole-grain carbohydrates was discussed in detail with the patient.   A list for source of those nutrients were also provided to the patient.  Patient will use only water or unsweetened tea for hydration. B.  The need to stay away from risky substances including alcohol, smoking; obtaining 7 to 9 hours of restorative sleep, at least 150 minutes of moderate intensity exercise weekly, the importance of healthy social connections,  and stress reduction techniques were discussed. C.  A full color page of  Calorie density of various food groups per pound showing examples of each food groups was provided to the patient.  - Nutritional counseling repeated at each appointment due to patients tendency to fall back in to old habits.  - The patient admits there is a  room for improvement in their diet and drink choices. -  Suggestion is made for the patient to avoid simple carbohydrates from their diet including Cakes, Sweet Desserts / Pastries, Ice Cream, Soda (diet and regular), Sweet Tea, Candies, Chips, Cookies, Sweet Pastries, Store Bought Juices, Alcohol in Excess of 1-2 drinks a day, Artificial Sweeteners, Coffee Creamer, and Sugar-free Products. This will help patient to have stable blood glucose profile and potentially avoid unintended weight gain.   - I encouraged the patient to switch to unprocessed or minimally processed complex starch and increased protein intake (animal or plant source), fruits, and vegetables.   - Patient is advised to stick to a routine mealtimes to eat 3 meals a day and avoid unnecessary snacks (to snack only to correct hypoglycemia).  - I have approached her with the following individualized plan to manage her diabetes and patient agrees:   -She is advised to continue her Metformin  1000 mg ER po daily with breakfast and Mounjaro  7.5 mg SQ weekly (recently started this higher dose).  -she is encouraged to continue monitoring fasting glucose at least 1-2 times per week.  - Adjustment parameters are given to her for hypo and hyperglycemia in writing. - she is encouraged to call clinic for blood glucose levels less than 70  or above 300 mg /dl.  - her Jardiance  was previously discontinued, risk outweighs benefit for this patient (given history of yeast infections in groin and lack of CHF or kidney disease).  - Specific targets for  A1c; LDL, HDL, and Triglycerides were discussed with the patient.  2) Blood Pressure /Hypertension:  her blood pressure is controlled to target without the use of antihypertensive medications.  3) Lipids/Hyperlipidemia:    Review of her recent lipid panel from 12/08/22 showed controlled LDL at 68.  she is advised to continue Lipitor 40 mg daily at bedtime.  Side effects and precautions discussed  with her.  4)  Weight/Diet:  her Body mass index is 42.4 kg/m.  -  clearly complicating her diabetes care.   she is a candidate for weight loss. I discussed with her the fact that loss of 5 - 10% of her  current body weight will have the most impact on her diabetes management.  Exercise, and detailed carbohydrates information provided  -  detailed on discharge instructions.  5) Hypothyroidism-suspect r/t Hashimotos She has family history of hypothyroidism.  She has never had any imaging of her thyroid  in the past.   There are no recent TFTs to review.  She is advised to continue Levothyroxine  150 mcg po daily before breakfast.  She notes she sometimes forgets to take it in the morning (and was still taking it with her Metformin  and Statin at that time) and will take it at night when she forgets.  We talked about proper administration again today.  Will recheck TFTs prior to next visit and adjust dose accordingly.     - The correct intake of thyroid  hormone (Levothyroxine , Synthroid ), is on empty stomach first thing in the morning, with water, separated by at least 30 minutes from breakfast and other medications,  and separated by more than 4 hours from calcium , iron, multivitamins, acid reflux medications (PPIs).  - This medication is a life-long medication and will be needed to correct thyroid  hormone imbalances for the rest of your life.  The dose may change from time to time, based on thyroid  blood work.  - It is extremely important to be consistent taking this medication, near the same time each morning.  -AVOID TAKING PRODUCTS CONTAINING BIOTIN (commonly found in Hair, Skin, Nails vitamins) AS IT INTERFERES WITH THE VALIDITY OF THYROID  FUNCTION BLOOD TESTS.  6) Chronic Care/Health Maintenance: -she is not on ACEI/ARB and is on Statin medications and is encouraged to initiate and continue to follow up with Ophthalmology, Dentist, Podiatrist at least yearly or according to recommendations, and  advised to stay away from smoking. I have recommended yearly flu vaccine and pneumonia vaccine at least every 5 years; moderate intensity exercise for up to 150 minutes weekly; and sleep for at least 7 hours a day.  - she is advised to maintain close follow up with Maribeth Camellia MATSU, MD for primary care needs, as well as her other providers for optimal and coordinated care.  Will also check am-cortisol to assess for adrenal dysfunction due to rapid weight gain.   I spent  43  minutes in the care of the patient today including review of labs from CMP, Lipids, Thyroid  Function, Hematology (current and previous including abstractions from other facilities); face-to-face time discussing  her blood glucose readings/logs, discussing hypoglycemia and hyperglycemia episodes and symptoms, medications doses, her options of short and long term treatment based on the latest standards of care / guidelines;  discussion about incorporating lifestyle  medicine;  and documenting the encounter. Risk reduction counseling performed per USPSTF guidelines to reduce obesity and cardiovascular risk factors.     Please refer to Patient Instructions for Blood Glucose Monitoring and Insulin/Medications Dosing Guide  in media tab for additional information. Please  also refer to  Patient Self Inventory in the Media  tab for reviewed elements of pertinent patient history.  Eknoor  D Beeney participated in the discussions, expressed understanding, and voiced agreement with the above plans.  All questions were answered to her satisfaction. she is encouraged to contact clinic should she have any questions or concerns prior to her return visit.     Follow up plan: - Return in about 3 months (around 12/28/2023) for Diabetes F/U with A1c in office, Thyroid  follow up, Previsit labs.   Benton Rio, [facility] [place] [address] [place], [place] [address] Phone: [phone] Fax:  [phone]  09/29/2023, 4:33 PM

## 2023-10-09 ENCOUNTER — Other Ambulatory Visit: Payer: Self-pay | Admitting: Obstetrics and Gynecology

## 2023-10-09 DIAGNOSIS — Z79890 Hormone replacement therapy (postmenopausal): Secondary | ICD-10-CM

## 2023-10-12 ENCOUNTER — Telehealth: Payer: Self-pay | Admitting: Obstetrics and Gynecology

## 2023-10-12 NOTE — Telephone Encounter (Addendum)
Contacted the patient via phone. She needs follow-up before refills sent in with Dr Logan Bores. I left message asking the patient to contact our office for scheduling.

## 2023-10-13 ENCOUNTER — Encounter: Payer: Self-pay | Admitting: Obstetrics and Gynecology

## 2023-10-13 NOTE — Telephone Encounter (Signed)
I left message asking the patient to contact our office for scheduling.

## 2023-10-13 NOTE — Telephone Encounter (Signed)
Follow up appointment letter has been sent to patient.

## 2023-11-22 DIAGNOSIS — E119 Type 2 diabetes mellitus without complications: Secondary | ICD-10-CM | POA: Diagnosis not present

## 2023-11-22 DIAGNOSIS — E039 Hypothyroidism, unspecified: Secondary | ICD-10-CM | POA: Diagnosis not present

## 2023-11-22 DIAGNOSIS — R635 Abnormal weight gain: Secondary | ICD-10-CM | POA: Diagnosis not present

## 2023-11-23 LAB — COMPREHENSIVE METABOLIC PANEL
ALT: 26 IU/L (ref 0–32)
AST: 43 IU/L — ABNORMAL HIGH (ref 0–40)
Albumin: 4.1 g/dL (ref 3.8–4.9)
Alkaline Phosphatase: 106 IU/L (ref 44–121)
BUN/Creatinine Ratio: 14 (ref 9–23)
BUN: 9 mg/dL (ref 6–24)
Bilirubin Total: <0.2 mg/dL (ref 0.0–1.2)
CO2: 19 mmol/L — ABNORMAL LOW (ref 20–29)
Calcium: 9 mg/dL (ref 8.7–10.2)
Chloride: 103 mmol/L (ref 96–106)
Creatinine, Ser: 0.65 mg/dL (ref 0.57–1.00)
Globulin, Total: 2.4 g/dL (ref 1.5–4.5)
Glucose: 293 mg/dL — ABNORMAL HIGH (ref 70–99)
Potassium: 4.4 mmol/L (ref 3.5–5.2)
Sodium: 138 mmol/L (ref 134–144)
Total Protein: 6.5 g/dL (ref 6.0–8.5)
eGFR: 104 mL/min/{1.73_m2} (ref >59)

## 2023-11-23 LAB — LIPID PANEL
Chol/HDL Ratio: 4.5 ratio — ABNORMAL HIGH (ref 0.0–4.4)
Cholesterol, Total: 143 mg/dL (ref 100–199)
HDL: 32 mg/dL — ABNORMAL LOW (ref >39)
LDL Chol Calc (NIH): 62 mg/dL (ref 0–99)
Triglycerides: 314 mg/dL — ABNORMAL HIGH (ref 0–149)
VLDL Cholesterol Cal: 49 mg/dL — ABNORMAL HIGH (ref 5–40)

## 2023-11-23 LAB — CORTISOL-AM, BLOOD: Cortisol - AM: 12.8 ug/dL (ref 6.2–19.4)

## 2023-11-23 LAB — VITAMIN D 25 HYDROXY (VIT D DEFICIENCY, FRACTURES): Vit D, 25-Hydroxy: 53.2 ng/mL (ref 30.0–100.0)

## 2023-11-23 LAB — TSH: TSH: 6.78 u[IU]/mL — ABNORMAL HIGH (ref 0.450–4.500)

## 2023-11-23 LAB — T4, FREE: Free T4: 1.17 ng/dL (ref 0.82–1.77)

## 2023-11-24 ENCOUNTER — Other Ambulatory Visit: Payer: Self-pay

## 2023-11-24 DIAGNOSIS — E119 Type 2 diabetes mellitus without complications: Secondary | ICD-10-CM

## 2023-11-24 MED ORDER — METFORMIN HCL ER 500 MG PO TB24: 1000.0000 mg | ORAL_TABLET | Freq: Every day | ORAL | 3 refills | Status: DC

## 2024-01-05 NOTE — Patient Instructions (Signed)

## 2024-01-06 ENCOUNTER — Encounter: Payer: Self-pay | Admitting: Nurse Practitioner

## 2024-01-06 ENCOUNTER — Ambulatory Visit (INDEPENDENT_AMBULATORY_CARE_PROVIDER_SITE_OTHER): Payer: BC Managed Care – PPO | Admitting: Nurse Practitioner

## 2024-01-06 VITALS — BP 122/84 | HR 88 | Ht 61.0 in | Wt 223.6 lb

## 2024-01-06 DIAGNOSIS — E039 Hypothyroidism, unspecified: Secondary | ICD-10-CM

## 2024-01-06 DIAGNOSIS — Z7985 Long-term (current) use of injectable non-insulin antidiabetic drugs: Secondary | ICD-10-CM

## 2024-01-06 DIAGNOSIS — E119 Type 2 diabetes mellitus without complications: Secondary | ICD-10-CM

## 2024-01-06 DIAGNOSIS — Z7984 Long term (current) use of oral hypoglycemic drugs: Secondary | ICD-10-CM | POA: Diagnosis not present

## 2024-01-06 LAB — POCT GLYCOSYLATED HEMOGLOBIN (HGB A1C): Hemoglobin A1C: 11.2 % — AB (ref 4.0–5.6)

## 2024-01-06 MED ORDER — LANTUS SOLOSTAR 100 UNIT/ML ~~LOC~~ SOPN: 20.0000 [IU] | PEN_INJECTOR | Freq: Every day | SUBCUTANEOUS | 3 refills | Status: DC

## 2024-01-06 MED ORDER — FREESTYLE LIBRE 3 PLUS SENSOR MISC: 3 refills | Status: DC

## 2024-01-06 MED ORDER — PEN NEEDLES 31G X 5 MM MISC: 3 refills | Status: DC

## 2024-01-06 MED ORDER — LEVOTHYROXINE SODIUM 175 MCG PO TABS: 175.0000 ug | ORAL_TABLET | Freq: Every day | ORAL | 1 refills | Status: DC

## 2024-01-06 NOTE — Progress Notes (Signed)
 Endocrinology Follow Up Note       01/06/2024, 4:39 PM   Subjective:    Patient ID: Tina Moreno, female    DOB: 1965/12/03.  Tina Moreno is being seen in consultation for management of currently uncontrolled symptomatic diabetes requested by  Bluford Burkitt, NP.   Past Medical History:  Diagnosis Date   Abnormal Pap smear of cervix    Anxiety    Asthma    Back pain    Cervical dysplasia    COVID-19 09/05/2021   Depression    Elevated WBCs    NEGATIVE HEM W/U   GERD (gastroesophageal reflux disease)    Hypothyroid     Past Surgical History:  Procedure Laterality Date   COLONOSCOPY WITH PROPOFOL N/A 10/01/2020   Procedure: COLONOSCOPY WITH PROPOFOL;  Surgeon: Selena Daily, MD;  Location: [facility] ENDOSCOPY;  Service: Gastroenterology;  Laterality: N/A;   COLPOSCOPY     GYNECOLOGIC CRYOSURGERY      Social History   Socioeconomic History   Marital status: Single    Spouse name: Not on file   Number of children: Not on file   Years of education: Not on file   Highest education level: Bachelor's degree (e.g., BA, AB, BS)  Occupational History   Not on file  Tobacco Use   Smoking status: Never   Smokeless tobacco: Never  Vaping Use   Vaping status: Never Used  Substance and Sexual Activity   Alcohol use: Yes    Comment: OCCASSIONALLY   Drug use: No   Sexual activity: Not Currently  Other Topics Concern   Not on file  Social History Narrative   Not on file   Social Drivers of Health   Financial Resource Strain: Medium Risk (09/09/2023)   Overall Financial Resource Strain (CARDIA)    Difficulty of Paying Living Expenses: Somewhat hard  Food Insecurity: No Food Insecurity (09/09/2023)   Hunger Vital Sign    Worried About Running Out of Food in the Last Year: Never true    Ran Out of Food in the Last Year: Never true  Transportation Needs: No Transportation Needs (09/09/2023)    PRAPARE - Administrator, Civil Service (Medical): No    Lack of Transportation (Non-Medical): No  Physical Activity: Insufficiently Active (09/09/2023)   Exercise Vital Sign    Days of Exercise per Week: 1 day    Minutes of Exercise per Session: 20 min  Stress: No Stress Concern Present (09/09/2023)   [employer] of Occupational Health - Occupational Stress Questionnaire    Feeling of Stress : Only a little  Social Connections: Socially Isolated (09/09/2023)   Social Connection and Isolation Panel [NHANES]    Frequency of Communication with Friends and Family: Once a week    Frequency of Social Gatherings with Friends and Family: Once a week    Attends Religious Services: Never    Database administrator or Organizations: No    Attends Engineer, structural: Not on file    Marital Status: Never married    Family History  Problem Relation Age of Onset   Parkinsonism Father    Diabetes Father    Hypertension  Father    Cancer Maternal Aunt        SOFT CELL SARCOMA   Cancer Maternal Uncle        TESTICULAR   Cancer Paternal Grandmother        COLON   Cancer Son        TESTICULAR    Outpatient Encounter Medications as of 01/06/2024  Medication Sig   albuterol (VENTOLIN HFA) 108 (90 Base) MCG/ACT inhaler Inhale 2 puffs into the lungs every 6 (six) hours as needed for wheezing or shortness of breath.   ALPRAZolam (XANAX) 0.25 MG tablet Take 1 tablet (0.25 mg total) by mouth daily as needed for anxiety.   atorvastatin (LIPITOR) 40 MG tablet TAKE 1 TABLET BY MOUTH EVERY DAY   carisoprodol (SOMA) 350 MG tablet Take 1 tablet (350 mg total) by mouth 3 (three) times daily as needed for muscle spasms.   Continuous Glucose Sensor (FREESTYLE LIBRE 3 PLUS SENSOR) MISC Change sensor every 15 days.   esomeprazole (NEXIUM) 20 MG capsule Take 20 mg by mouth daily at 12 noon.   fluticasone (FLONASE) 50 MCG/ACT nasal spray Place 2 sprays into both nostrils daily as needed  for allergies or rhinitis.   fluticasone furoate-vilanterol (BREO ELLIPTA) 100-25 MCG/ACT AEPB INHALE 1 PUFF BY MOUTH EVERY DAY   ibuprofen (ADVIL,MOTRIN) 200 MG tablet Take 200 mg by mouth every 6 (six) hours as needed.   insulin glargine (LANTUS SOLOSTAR) 100 UNIT/ML Solostar Pen Inject 20 Units into the skin at bedtime.   Insulin Pen Needle (PEN NEEDLES) 31G X 5 MM MISC Use to inject insulin once daily   metFORMIN (GLUCOPHAGE-XR) 500 MG 24 hr tablet Take 2 tablets (1,000 mg total) by mouth daily with breakfast.   [DISCONTINUED] levothyroxine (SYNTHROID) 150 MCG tablet Take 1 tablet (150 mcg total) by mouth daily before breakfast.   [DISCONTINUED] tirzepatide (MOUNJARO) 7.5 MG/0.5ML Pen Inject 7.5 mg into the skin once a week.   levothyroxine (SYNTHROID) 175 MCG tablet Take 1 tablet (175 mcg total) by mouth daily before breakfast.   norethindrone-ethinyl estradiol (FYAVOLV) 1-5 MG-MCG TABS tablet TAKE 1 TABLET BY MOUTH EVERY DAY (Patient not taking: Reported on 01/06/2024)   No facility-administered encounter medications on file as of 01/06/2024.    ALLERGIES: Allergies  Allergen Reactions   Amoxicillin Other (See Comments)    Dizziness   Other     TREES    VACCINATION STATUS: Immunization History  Administered Date(s) Administered   PFIZER(Purple Top)SARS-COV-2 Vaccination 06/07/2020, 06/28/2020   PNEUMOCOCCAL CONJUGATE-20 04/30/2021   Td 04/30/2021    Diabetes She presents for her follow-up diabetic visit. She has type 2 diabetes mellitus. Onset time: diagnosed at approx age of 89. Her disease course has been worsening. There are no hypoglycemic associated symptoms. Associated symptoms include fatigue and foot paresthesias. Pertinent negatives for diabetes include no polyuria (on Jardiance) and no weight loss. There are no hypoglycemic complications. There are no diabetic complications. Risk factors for coronary artery disease include diabetes mellitus, family history, obesity,  hypertension and sedentary lifestyle. Current diabetic treatment includes oral agent (monotherapy) (and Mounjaro). She is compliant with treatment most of the time. Her weight is stable. She is following a generally unhealthy diet. When asked about meal planning, she reported none. She has not had a previous visit with a dietitian. She rarely participates in exercise. Her home blood glucose trend is increasing steadily. Her overall blood glucose range is >200 mg/dl. (She presents today with no meter or logs to review.  Her POCT A1c today is 11.2%, increasing from last visit of 9.7%.  She notes she is unable to tolerate the Mounjaro anymore.  The GI SE are too much for her and it is not helping her glucose.  She is still having glucose readings in the 300 range.) An ACE inhibitor/angiotensin II receptor blocker is not being taken. She does not see a podiatrist.Eye exam is current.     Review of systems  Constitutional: + stable body weight, current Body mass index is 42.25 kg/m., no fatigue, no subjective hyperthermia, no subjective hypothermia Eyes: no blurry vision, no xerophthalmia ENT: no sore throat, no nodules palpated in throat, no dysphagia/odynophagia, no hoarseness Cardiovascular: no chest pain, no shortness of breath, no palpitations, no leg swelling Respiratory: no cough, no shortness of breath Gastrointestinal: + intermittent nausea and gas (r/t Mounjaro injections) Musculoskeletal: no muscle/joint aches Skin: no rashes, no hyperemia, + brittle nails Neurological: no tremors, no numbness, no tingling, no dizziness Psychiatric: no depression, no anxiety  Objective:     BP 122/84 (BP Location: Left Arm, Patient Position: Sitting, Cuff Size: Large)   Pulse 88   Ht 5\' 1"  (1.549 m)   Wt 223 lb 9.6 oz (101.4 kg)   LMP 06/16/2021 (Approximate)   BMI 42.25 kg/m   Wt Readings from Last 3 Encounters:  01/06/24 223 lb 9.6 oz (101.4 kg)  09/29/23 224 lb 6.4 oz (101.8 kg)  09/10/23 219  lb (99.3 kg)     BP Readings from Last 3 Encounters:  01/06/24 122/84  09/29/23 125/83  09/10/23 134/84       Physical Exam- Limited  Constitutional:  Body mass index is 42.25 kg/m. , not in acute distress, normal state of mind Eyes:  EOMI, no exophthalmos Musculoskeletal: no gross deformities, strength intact in all four extremities, no gross restriction of joint movements Skin:  no rashes, no hyperemia Neurological: no tremor with outstretched hands   Diabetic Foot Exam - Simple   No data filed     CMP ( most recent) CMP     Component Value Date/Time   NA 138 11/22/2023 0910   K 4.4 11/22/2023 0910   CL 103 11/22/2023 0910   CO2 19 (L) 11/22/2023 0910   GLUCOSE 293 (H) 11/22/2023 0910   GLUCOSE 118 (H) 12/08/2022 0937   BUN 9 11/22/2023 0910   CREATININE 0.65 11/22/2023 0910   CREATININE 0.60 02/23/2014 0822   CALCIUM 9.0 11/22/2023 0910   PROT 6.5 11/22/2023 0910   ALBUMIN 4.1 11/22/2023 0910   AST 43 (H) 11/22/2023 0910   ALT 26 11/22/2023 0910   ALKPHOS 106 11/22/2023 0910   BILITOT <0.2 11/22/2023 0910   GFR 105.42 12/08/2022 0937   EGFR 104 11/22/2023 0910   GFRNONAA 98 12/11/2016 0956     Diabetic Labs (most recent): Lab Results  Component Value Date   HGBA1C 11.2 (A) 01/06/2024   HGBA1C 9.7 (A) 09/29/2023   HGBA1C 7.6 (A) 06/08/2023   MICROALBUR <0.7 12/08/2022   MICROALBUR <0.7 11/24/2021   MICROALBUR 2.9 (H) 08/27/2020     Lipid Panel ( most recent) Lipid Panel     Component Value Date/Time   CHOL 143 11/22/2023 0910   TRIG 314 (H) 11/22/2023 0910   HDL 32 (L) 11/22/2023 0910   CHOLHDL 4.5 (H) 11/22/2023 0910   CHOLHDL 3 12/08/2022 0937   VLDL 25.6 12/08/2022 0937   LDLCALC 62 11/22/2023 0910   LDLDIRECT 62.0 04/17/2022 0808   LABVLDL 49 (H) 11/22/2023 0910  Lab Results  Component Value Date   TSH 6.780 (H) 11/22/2023   TSH 1.570 05/28/2023   TSH 0.45 01/26/2023   TSH 0.02 (L) 12/08/2022   TSH 0.90 07/30/2022   TSH  14.46 (H) 06/09/2022   TSH 0.88 11/24/2021   TSH 0.62 04/11/2021   TSH 0.02 (L) 01/28/2021   TSH 0.62 12/11/2020   FREET4 1.17 11/22/2023   FREET4 1.23 05/28/2023           Assessment & Plan:   1) Type 2 diabetes mellitus without complication, without long-term current use of insulin (HCC)   She presents today with no meter or logs to review.  Her POCT A1c today is 11.2%, increasing from last visit of 9.7%.  She notes she is unable to tolerate the Mounjaro anymore.  The GI SE are too much for her and it is not helping her glucose.  She is still having glucose readings in the 300 range.  Herbert Seta D Clemon has currently uncontrolled symptomatic type 2 DM since 58 years of age.  -Recent labs reviewed.  - I had a long discussion with her about the progressive nature of diabetes and the pathology behind its complications. -her diabetes is not currently complicated but she remains at a high risk for more acute and chronic complications which include CAD, CVA, CKD, retinopathy, and neuropathy. These are all discussed in detail with her.  The following Lifestyle Medicine recommendations according to American College of Lifestyle Medicine [facility]) were discussed and offered to patient and she agrees to start the journey:  A. Whole Foods, Plant-based plate comprising of fruits and vegetables, plant-based proteins, whole-grain carbohydrates was discussed in detail with the patient.   A list for source of those nutrients were also provided to the patient.  Patient will use only water or unsweetened tea for hydration. B.  The need to stay away from risky substances including alcohol, smoking; obtaining 7 to 9 hours of restorative sleep, at least 150 minutes of moderate intensity exercise weekly, the importance of healthy social connections,  and stress reduction techniques were discussed. C.  A full color page of  Calorie density of various food groups per pound showing examples of each food groups  was provided to the patient.  - Nutritional counseling repeated at each appointment due to patients tendency to fall back in to old habits.  - The patient admits there is a room for improvement in their diet and drink choices. -  Suggestion is made for the patient to avoid simple carbohydrates from their diet including Cakes, Sweet Desserts / Pastries, Ice Cream, Soda (diet and regular), Sweet Tea, Candies, Chips, Cookies, Sweet Pastries, Store Bought Juices, Alcohol in Excess of 1-2 drinks a day, Artificial Sweeteners, Coffee Creamer, and "Sugar-free" Products. This will help patient to have stable blood glucose profile and potentially avoid unintended weight gain.   - I encouraged the patient to switch to unprocessed or minimally processed complex starch and increased protein intake (animal or plant source), fruits, and vegetables.   - Patient is advised to stick to a routine mealtimes to eat 3 meals a day and avoid unnecessary snacks (to snack only to correct hypoglycemia).  - I have approached her with the following individualized plan to manage her diabetes and patient agrees:   -She is advised to continue her Metformin 1000 mg ER po daily with breakfast and stop the Mounjaro.  Will initiate basal insulin with Lantus 20 units SQ nightly.  We discussed proper administration of  this today.  -she is encouraged to start monitoring glucose twice daily, before breakfast and before bed, and to call the clinic if she has readings less than 70 or above 300 for 3 tests in a row.  I did ask she send readings in 2 weeks so we can further adjust insulin as needed.  I did send in for [facility] 3 plus sensors for her and gave her 2 sample sensors to try at home.  - Adjustment parameters are given to her for hypo and hyperglycemia in writing.  - her Evone Hoh was previously discontinued, risk outweighs benefit for this patient (given history of yeast infections in groin and lack of CHF or kidney disease).  -  Specific targets for  A1c; LDL, HDL, and Triglycerides were discussed with the patient.  2) Blood Pressure /Hypertension:  her blood pressure is controlled to target without the use of antihypertensive medications.  3) Lipids/Hyperlipidemia:    Review of her recent lipid panel from 12/08/22 showed controlled LDL at 68.  she is advised to continue Lipitor 40 mg daily at bedtime.  Side effects and precautions discussed with her.  4)  Weight/Diet:  her Body mass index is 42.25 kg/m.  -  clearly complicating her diabetes care.   she is a candidate for weight loss. I discussed with her the fact that loss of 5 - 10% of her  current body weight will have the most impact on her diabetes management.  Exercise, and detailed carbohydrates information provided  -  detailed on discharge instructions.  5) Hypothyroidism-suspect r/t Hashimotos She has family history of hypothyroidism.  She has never had any imaging of her thyroid in the past.   Her previsit TFTs are consistent with under-replacement.  She notes she switched back to night time administration as she was frequently forgetting it in the morning.  She felt it was better to take it every day and not all be absorbed than to not take it at all.   She is advised to increase Levothyroxine to 175 mcg po daily.   Will recheck TFTs prior to next visit and adjust dose accordingly.     - The correct intake of thyroid hormone (Levothyroxine, Synthroid), is on empty stomach first thing in the morning, with water, separated by at least 30 minutes from breakfast and other medications,  and separated by more than 4 hours from calcium, iron, multivitamins, acid reflux medications (PPIs).  - This medication is a life-long medication and will be needed to correct thyroid hormone imbalances for the rest of your life.  The dose may change from time to time, based on thyroid blood work.  - It is extremely important to be consistent taking this medication, near the same  time each morning.  -AVOID TAKING PRODUCTS CONTAINING BIOTIN (commonly found in Hair, Skin, Nails vitamins) AS IT INTERFERES WITH THE VALIDITY OF THYROID FUNCTION BLOOD TESTS.  6) Chronic Care/Health Maintenance: -she is not on ACEI/ARB and is on Statin medications and is encouraged to initiate and continue to follow up with Ophthalmology, Dentist, Podiatrist at least yearly or according to recommendations, and advised to stay away from smoking. I have recommended yearly flu vaccine and pneumonia vaccine at least every 5 years; moderate intensity exercise for up to 150 minutes weekly; and sleep for at least 7 hours a day.  - she is advised to maintain close follow up with Bluford Burkitt, NP for primary care needs, as well as her other providers for optimal and coordinated care.  I spent  55  minutes in the care of the patient today including review of labs from CMP, Lipids, Thyroid Function, Hematology (current and previous including abstractions from other facilities); face-to-face time discussing  her blood glucose readings/logs, discussing hypoglycemia and hyperglycemia episodes and symptoms, medications doses, her options of short and long term treatment based on the latest standards of care / guidelines;  discussion about incorporating lifestyle medicine;  and documenting the encounter. Risk reduction counseling performed per USPSTF guidelines to reduce obesity and cardiovascular risk factors.     Please refer to Patient Instructions for Blood Glucose Monitoring and Insulin/Medications Dosing Guide"  in media tab for additional information. Please  also refer to " Patient Self Inventory" in the Media  tab for reviewed elements of pertinent patient history.  Harlan Liberty Whipple participated in the discussions, expressed understanding, and voiced agreement with the above plans.  All questions were answered to her satisfaction. she is encouraged to contact clinic should she have any questions or  concerns prior to her return visit.     Follow up plan: - Return in about 3 months (around 04/06/2024) for Diabetes F/U with A1c in office, Thyroid follow up, Previsit labs, Bring meter and logs.   Hulon Magic, [facility] [address] [place], [place] [address] Phone: [phone] Fax: [phone]  01/06/2024, 4:39 PM

## 2024-01-13 ENCOUNTER — Encounter: Payer: Self-pay | Admitting: Nurse Practitioner

## 2024-01-13 ENCOUNTER — Other Ambulatory Visit: Payer: Self-pay

## 2024-01-13 ENCOUNTER — Other Ambulatory Visit: Payer: Self-pay | Admitting: Nurse Practitioner

## 2024-01-13 DIAGNOSIS — E039 Hypothyroidism, unspecified: Secondary | ICD-10-CM

## 2024-01-13 MED ORDER — FREESTYLE LIBRE 3 PLUS SENSOR MISC: 3 refills | Status: DC

## 2024-01-13 MED ORDER — LEVOTHYROXINE SODIUM 175 MCG PO TABS: 175.0000 ug | ORAL_TABLET | Freq: Every day | ORAL | 1 refills | Status: DC

## 2024-01-13 MED ORDER — LANTUS SOLOSTAR 100 UNIT/ML ~~LOC~~ SOPN: 20.0000 [IU] | PEN_INJECTOR | Freq: Every day | SUBCUTANEOUS | 0 refills | Status: DC

## 2024-01-13 MED ORDER — PEN NEEDLES 31G X 5 MM MISC: 3 refills | Status: DC

## 2024-01-14 ENCOUNTER — Telehealth: Payer: Self-pay

## 2024-01-14 NOTE — Telephone Encounter (Signed)
 Pharmacy Patient Advocate Encounter   Received notification from CoverMyMeds that prior authorization for Freestyle libre 3 plus is required/requested.   Insurance verification completed.   The patient is insured through [employer].   Per test claim: PA required; PA started via CoverMyMeds. KEY [id number] . Please see clinical question(s) below that I am not finding the answer to in her chart and advise.

## 2024-01-17 ENCOUNTER — Other Ambulatory Visit (HOSPITAL_COMMUNITY): Payer: Self-pay

## 2024-01-17 NOTE — Telephone Encounter (Signed)
 My apologies, I did not see it as it was filed under the [facility] when I filtered by our location.

## 2024-01-17 NOTE — Telephone Encounter (Signed)
 Currently, the patient is injecting Lantus  25 units at bedtime. In the office note for 01/06/2024.

## 2024-01-17 NOTE — Telephone Encounter (Signed)
 Patient notes PA is needed for [facility].  Can you please check on this?

## 2024-01-19 NOTE — Telephone Encounter (Signed)
 Patient was called and made aware.

## 2024-01-19 NOTE — Telephone Encounter (Signed)
 That is unfortunate.  Can you call the patient and let her know?  Let her know she can get [facility] which is an over the counter version of Dexcom which does not have to have insurance approval.

## 2024-02-03 ENCOUNTER — Encounter: Payer: Self-pay | Admitting: Nurse Practitioner

## 2024-03-10 ENCOUNTER — Encounter: Payer: Self-pay | Admitting: Nurse Practitioner

## 2024-03-10 ENCOUNTER — Ambulatory Visit: Payer: BC Managed Care – PPO | Admitting: Nurse Practitioner

## 2024-03-10 VITALS — BP 126/82 | HR 110 | Temp 98.3°F | Resp 20 | Ht 61.0 in | Wt 215.4 lb

## 2024-03-10 DIAGNOSIS — E119 Type 2 diabetes mellitus without complications: Secondary | ICD-10-CM | POA: Diagnosis not present

## 2024-03-10 DIAGNOSIS — Z6841 Body Mass Index (BMI) 40.0 and over, adult: Secondary | ICD-10-CM

## 2024-03-10 DIAGNOSIS — Z7984 Long term (current) use of oral hypoglycemic drugs: Secondary | ICD-10-CM

## 2024-03-10 DIAGNOSIS — E039 Hypothyroidism, unspecified: Secondary | ICD-10-CM

## 2024-03-10 DIAGNOSIS — M549 Dorsalgia, unspecified: Secondary | ICD-10-CM | POA: Diagnosis not present

## 2024-03-10 DIAGNOSIS — F419 Anxiety disorder, unspecified: Secondary | ICD-10-CM

## 2024-03-10 DIAGNOSIS — Z794 Long term (current) use of insulin: Secondary | ICD-10-CM

## 2024-03-10 DIAGNOSIS — F32A Depression, unspecified: Secondary | ICD-10-CM

## 2024-03-10 DIAGNOSIS — G8929 Other chronic pain: Secondary | ICD-10-CM

## 2024-03-10 DIAGNOSIS — E7800 Pure hypercholesterolemia, unspecified: Secondary | ICD-10-CM

## 2024-03-10 NOTE — Progress Notes (Unsigned)
  Bluford Burkitt, NP-C Phone: [phone]  Tina Moreno is a 58 y.o. female who presents today for transfer of care.   ***  Social History   Tobacco Use  Smoking Status Never  Smokeless Tobacco Never    Current Outpatient Medications on File Prior to Visit  Medication Sig Dispense Refill  . albuterol  (VENTOLIN  HFA) 108 (90 Base) MCG/ACT inhaler Inhale 2 puffs into the lungs every 6 (six) hours as needed for wheezing or shortness of breath. 1 each 2  . ALPRAZolam  (XANAX ) 0.25 MG tablet Take 1 tablet (0.25 mg total) by mouth daily as needed for anxiety. 30 tablet 0  . atorvastatin  (LIPITOR) 40 MG tablet TAKE 1 TABLET BY MOUTH EVERY DAY 90 tablet 3  . carisoprodol  (SOMA ) 350 MG tablet Take 1 tablet (350 mg total) by mouth 3 (three) times daily as needed for muscle spasms. 30 tablet 0  . Continuous Glucose Sensor (FREESTYLE LIBRE 3 PLUS SENSOR) MISC Change sensor every 15 days. 6 each 3  . esomeprazole (NEXIUM) 20 MG capsule Take 20 mg by mouth daily at 12 noon.    . fluticasone  (FLONASE ) 50 MCG/ACT nasal spray Place 2 sprays into both nostrils daily as needed for allergies or rhinitis. 16 g 11  . fluticasone  furoate-vilanterol (BREO ELLIPTA ) 100-25 MCG/ACT AEPB INHALE 1 PUFF BY MOUTH EVERY DAY 60 each 0  . ibuprofen (ADVIL,MOTRIN) 200 MG tablet Take 200 mg by mouth every 6 (six) hours as needed.    . insulin glargine  (LANTUS  SOLOSTAR) 100 UNIT/ML Solostar Pen Inject 20 Units into the skin at bedtime. (Patient taking differently: Inject 30 Units into the skin at bedtime.) 30 mL 0  . Insulin Pen Needle (PEN NEEDLES) 31G X 5 MM MISC Use to inject insulin once daily 100 each 3  . levothyroxine  (SYNTHROID ) 175 MCG tablet Take 1 tablet (175 mcg total) by mouth daily before breakfast. 90 tablet 1  . metFORMIN  (GLUCOPHAGE -XR) 500 MG 24 hr tablet Take 2 tablets (1,000 mg total) by mouth daily with breakfast. 180 tablet 3  . norethindrone -ethinyl estradiol  (FYAVOLV) 1-5 MG-MCG TABS tablet TAKE 1  TABLET BY MOUTH EVERY DAY (Patient not taking: Reported on 03/10/2024) 84 tablet 3   No current facility-administered medications on file prior to visit.     ROS see history of present illness  Objective  Physical Exam Vitals:   03/10/24 1359  BP: 126/82  Pulse: (!) 110  Resp: 20  Temp: 98.3 F (36.8 C)  SpO2: 98%    BP Readings from Last 3 Encounters:  03/10/24 126/82  01/06/24 122/84  09/29/23 125/83   Wt Readings from Last 3 Encounters:  03/10/24 215 lb 6 oz (97.7 kg)  01/06/24 223 lb 9.6 oz (101.4 kg)  09/29/23 224 lb 6.4 oz (101.8 kg)    Physical Exam   Assessment/Plan: Please see individual problem list.  There are no diagnoses linked to this encounter.   Health Maintenance: ***  No follow-ups on file.   Bluford Burkitt, NP-C [facility]

## 2024-03-16 ENCOUNTER — Encounter: Payer: Self-pay | Admitting: Nurse Practitioner

## 2024-03-16 ENCOUNTER — Telehealth: Payer: Self-pay

## 2024-03-16 DIAGNOSIS — Z6841 Body Mass Index (BMI) 40.0 and over, adult: Secondary | ICD-10-CM | POA: Insufficient documentation

## 2024-03-16 MED ORDER — NALTREXONE-BUPROPION HCL ER 8-90 MG PO TB12: ORAL_TABLET | ORAL | 3 refills | Status: DC

## 2024-03-16 NOTE — Assessment & Plan Note (Signed)
 She is on levothyroxine  for thyroid  management. She reports dry skin, brittle nails, heat intolerance, and occasional palpitations. Recent change in levothyroxine  dosage. She should continue levothyroxine  at 175 mcg daily and ensure regular follow-up with endocrinology.

## 2024-03-16 NOTE — Assessment & Plan Note (Signed)
 Managed with Lipitor 40 mg daily. Continue.

## 2024-03-16 NOTE — Assessment & Plan Note (Addendum)
 Her persistent hyperglycemia continues despite Lantus  and metformin , with symptoms of polydipsia and polyuria. Last A1c in April was 11.2 indicating poor glycemic control. She is intolerant to Mounjaro  and similar medications. Emphasis is placed on diet and exercise. She should continue Lantus  at 30 units and metformin  1000 mg daily, focus on dietary modifications and exercise. Follow up with Endocrinology as scheduled.

## 2024-03-16 NOTE — Assessment & Plan Note (Addendum)
 Depression affects her daily activities and interest, with adverse reactions to antidepressants. The potential benefits of medication and revisiting psychiatric care were discussed. A referral to psychiatry is made for further evaluation and management. Continue Xanax  0.25 mg daily as needed for anxiety. PHQ- 14 and GAD- 3 today. Denies SI/HI.

## 2024-03-16 NOTE — Assessment & Plan Note (Signed)
 She is interested in weight loss to aid glycemic control but is intolerant to Mounjaro , Ozempic , and Trulicity . Contrave, containing Wellbutrin, was discussed, noting previous side effects. She was informed about potential nausea and the titration schedule. Start Contrave. Referral to a weight loss clinic will be considered if needed. Encourage healthy diet, regular exercise and adequate hydration.

## 2024-03-16 NOTE — Assessment & Plan Note (Signed)
 She recently experienced a back muscle strain with spasms and persistent pain. Due to the risk of hyperglycemia, she avoids steroids. Regular use of Soma  is advised for a few days, along with acetaminophen or ibuprofen for pain relief. Ice or heat should be applied to the affected area. Return precautions given to patient.

## 2024-03-16 NOTE — Telephone Encounter (Signed)
 PA needed for:  Naltrexone-buPROPion HCl ER 8-90 MG TB12

## 2024-03-17 ENCOUNTER — Other Ambulatory Visit (HOSPITAL_COMMUNITY): Payer: Self-pay

## 2024-03-17 ENCOUNTER — Telehealth: Payer: Self-pay

## 2024-03-17 NOTE — Telephone Encounter (Signed)
 Pharmacy Patient Advocate Encounter   Received notification from CoverMyMeds that prior authorization for Contrave  8-90 mg er tablets is required/requested.   Insurance verification completed.   The patient is insured through [employer] .   Per test claim: PA required; PA submitted to above mentioned insurance via CoverMyMeds Key/confirmation #/EOC [id number] Status is pending

## 2024-03-17 NOTE — Telephone Encounter (Signed)
 PA request has been Started. New Encounter has been or will be created for follow up. For additional info see Pharmacy Prior Auth telephone encounter from 03/17/2024.

## 2024-03-28 ENCOUNTER — Telehealth: Payer: Self-pay

## 2024-03-28 NOTE — Telephone Encounter (Signed)
 Weight loss Questionnaire form was received needing provider signature placed in provider to be signed folder

## 2024-04-04 ENCOUNTER — Other Ambulatory Visit (HOSPITAL_COMMUNITY): Payer: Self-pay

## 2024-04-04 NOTE — Telephone Encounter (Signed)
 Forms faxed to [phone] with a completed transmission log

## 2024-04-04 NOTE — Telephone Encounter (Signed)
 Prior Authorization form/request asks a question that requires your assistance. Please see the question below and advise accordingly. The PA will not be submitted until the necessary information is received.

## 2024-04-07 DIAGNOSIS — E039 Hypothyroidism, unspecified: Secondary | ICD-10-CM | POA: Diagnosis not present

## 2024-04-08 LAB — T4, FREE: Free T4: 1.67 ng/dL (ref 0.82–1.77)

## 2024-04-08 LAB — TSH: TSH: 0.688 u[IU]/mL (ref 0.450–4.500)

## 2024-04-13 ENCOUNTER — Telehealth: Payer: Self-pay | Admitting: Nurse Practitioner

## 2024-04-13 ENCOUNTER — Other Ambulatory Visit (HOSPITAL_COMMUNITY): Payer: Self-pay

## 2024-04-13 ENCOUNTER — Telehealth: Payer: Self-pay

## 2024-04-13 ENCOUNTER — Ambulatory Visit (INDEPENDENT_AMBULATORY_CARE_PROVIDER_SITE_OTHER): Admitting: Nurse Practitioner

## 2024-04-13 ENCOUNTER — Encounter: Payer: Self-pay | Admitting: Nurse Practitioner

## 2024-04-13 VITALS — BP 122/84 | HR 110 | Ht 61.0 in | Wt 218.6 lb

## 2024-04-13 DIAGNOSIS — E119 Type 2 diabetes mellitus without complications: Secondary | ICD-10-CM | POA: Diagnosis not present

## 2024-04-13 DIAGNOSIS — E039 Hypothyroidism, unspecified: Secondary | ICD-10-CM | POA: Diagnosis not present

## 2024-04-13 DIAGNOSIS — Z794 Long term (current) use of insulin: Secondary | ICD-10-CM | POA: Diagnosis not present

## 2024-04-13 DIAGNOSIS — Z7984 Long term (current) use of oral hypoglycemic drugs: Secondary | ICD-10-CM | POA: Diagnosis not present

## 2024-04-13 MED ORDER — LANTUS SOLOSTAR 100 UNIT/ML ~~LOC~~ SOPN: 50.0000 [IU] | PEN_INJECTOR | Freq: Every day | SUBCUTANEOUS | 3 refills | Status: DC

## 2024-04-13 MED ORDER — PEN NEEDLES 31G X 5 MM MISC: 3 refills | Status: AC

## 2024-04-13 MED ORDER — HUMALOG KWIKPEN 200 UNIT/ML ~~LOC~~ SOPN: 10.0000 [IU] | PEN_INJECTOR | Freq: Three times a day (TID) | SUBCUTANEOUS | 3 refills | Status: DC

## 2024-04-13 MED ORDER — FREESTYLE LIBRE 3 PLUS SENSOR MISC: 3 refills | Status: DC

## 2024-04-13 NOTE — Telephone Encounter (Signed)
 PRIOR AUTH COMPLETED BY OFFICE.

## 2024-04-13 NOTE — Telephone Encounter (Signed)
 Pharmacy Patient Advocate Encounter   Received notification from Pt Calls Messages that prior authorization for Freestyle libre 3 plus is required/requested.   Insurance verification completed.   The patient is insured through [employer] .   Per test claim: PA required; PA started via CoverMyMeds. KEY [id number] . Waiting for clinical questions to populate.

## 2024-04-13 NOTE — Progress Notes (Signed)
 Endocrinology Follow Up Note       04/13/2024, 4:27 PM   Subjective:    Patient ID: Tina Moreno, female    DOB: 1965/12/25.  Tina Moreno is being seen in consultation for management of currently uncontrolled symptomatic diabetes requested by  Gretel App, NP.   Past Medical History:  Diagnosis Date   Abnormal Pap smear of cervix    Anxiety    Asthma    Back pain    Cervical dysplasia    COVID-19 09/05/2021   Depression    Elevated WBCs    NEGATIVE HEM W/U   GERD (gastroesophageal reflux disease)    Hypothyroid     Past Surgical History:  Procedure Laterality Date   COLONOSCOPY WITH PROPOFOL  N/A 10/01/2020   Procedure: COLONOSCOPY WITH PROPOFOL ;  Surgeon: Unk Corinn Skiff, MD;  Location: [facility] ENDOSCOPY;  Service: Gastroenterology;  Laterality: N/A;   COLPOSCOPY     GYNECOLOGIC CRYOSURGERY      Social History   Socioeconomic History   Marital status: Single    Spouse name: Not on file   Number of children: Not on file   Years of education: Not on file   Highest education level: Bachelor's degree (e.g., BA, AB, BS)  Occupational History   Not on file  Tobacco Use   Smoking status: Never   Smokeless tobacco: Never  Vaping Use   Vaping status: Never Used  Substance and Sexual Activity   Alcohol use: Yes    Comment: OCCASSIONALLY   Drug use: No   Sexual activity: Not Currently  Other Topics Concern   Not on file  Social History Narrative   Not on file   Social Drivers of Health   Financial Resource Strain: Medium Risk (09/09/2023)   Overall Financial Resource Strain (CARDIA)    Difficulty of Paying Living Expenses: Somewhat hard  Food Insecurity: No Food Insecurity (09/09/2023)   Hunger Vital Sign    Worried About Running Out of Food in the Last Year: Never true    Ran Out of Food in the Last Year: Never true  Transportation Needs: No Transportation Needs (09/09/2023)    PRAPARE - Administrator, Civil Service (Medical): No    Lack of Transportation (Non-Medical): No  Physical Activity: Insufficiently Active (09/09/2023)   Exercise Vital Sign    Days of Exercise per Week: 1 day    Minutes of Exercise per Session: 20 min  Stress: No Stress Concern Present (09/09/2023)   [employer] of Occupational Health - Occupational Stress Questionnaire    Feeling of Stress : Only a little  Social Connections: Socially Isolated (09/09/2023)   Social Connection and Isolation Panel    Frequency of Communication with Friends and Family: Once a week    Frequency of Social Gatherings with Friends and Family: Once a week    Attends Religious Services: Never    Database administrator or Organizations: No    Attends Engineer, structural: Not on file    Marital Status: Never married    Family History  Problem Relation Age of Onset   Parkinsonism Father    Diabetes Father    Hypertension Father  Cancer Maternal Aunt        SOFT CELL SARCOMA   Cancer Maternal Uncle        TESTICULAR   Cancer Paternal Grandmother        COLON   Cancer Son        TESTICULAR    Outpatient Encounter Medications as of 04/13/2024  Medication Sig   albuterol  (VENTOLIN  HFA) 108 (90 Base) MCG/ACT inhaler Inhale 2 puffs into the lungs every 6 (six) hours as needed for wheezing or shortness of breath.   ALPRAZolam  (XANAX ) 0.25 MG tablet Take 1 tablet (0.25 mg total) by mouth daily as needed for anxiety.   atorvastatin  (LIPITOR) 40 MG tablet TAKE 1 TABLET BY MOUTH EVERY DAY   carisoprodol  (SOMA ) 350 MG tablet Take 1 tablet (350 mg total) by mouth 3 (three) times daily as needed for muscle spasms.   esomeprazole (NEXIUM) 20 MG capsule Take 20 mg by mouth daily at 12 noon.   fluticasone  (FLONASE ) 50 MCG/ACT nasal spray Place 2 sprays into both nostrils daily as needed for allergies or rhinitis.   fluticasone  furoate-vilanterol (BREO ELLIPTA ) 100-25 MCG/ACT AEPB INHALE 1  PUFF BY MOUTH EVERY DAY   ibuprofen (ADVIL,MOTRIN) 200 MG tablet Take 200 mg by mouth every 6 (six) hours as needed.   insulin lispro  (HUMALOG  KWIKPEN) 200 UNIT/ML KwikPen Inject 10-16 Units into the skin 3 (three) times daily before meals.   levothyroxine  (SYNTHROID ) 175 MCG tablet Take 1 tablet (175 mcg total) by mouth daily before breakfast.   metFORMIN  (GLUCOPHAGE -XR) 500 MG 24 hr tablet Take 2 tablets (1,000 mg total) by mouth daily with breakfast.   Naltrexone -buPROPion  HCl ER 8-90 MG TB12 Start 1 tablet every morning for 7 days, then 1 tablet twice daily for 7 days, then 2 tablets every morning and one in the evening   [DISCONTINUED] Continuous Glucose Sensor (FREESTYLE LIBRE 3 PLUS SENSOR) MISC Change sensor every 15 days.   [DISCONTINUED] insulin glargine  (LANTUS  SOLOSTAR) 100 UNIT/ML Solostar Pen Inject 20 Units into the skin at bedtime. (Patient taking differently: Inject 40 Units into the skin at bedtime.)   [DISCONTINUED] Insulin Pen Needle (PEN NEEDLES) 31G X 5 MM MISC Use to inject insulin once daily   Continuous Glucose Sensor (FREESTYLE LIBRE 3 PLUS SENSOR) MISC Change sensor every 15 days.   insulin glargine  (LANTUS  SOLOSTAR) 100 UNIT/ML Solostar Pen Inject 50 Units into the skin at bedtime.   Insulin Pen Needle (PEN NEEDLES) 31G X 5 MM MISC Use to inject insulin 4 times daily   No facility-administered encounter medications on file as of 04/13/2024.    ALLERGIES: Allergies  Allergen Reactions   Amoxicillin Other (See Comments)    Dizziness   Other     TREES    VACCINATION STATUS: Immunization History  Administered Date(s) Administered   PFIZER(Purple Top)SARS-COV-2 Vaccination 06/07/2020, 06/28/2020   PNEUMOCOCCAL CONJUGATE-20 04/30/2021   Td 04/30/2021    Diabetes She presents for her follow-up diabetic visit. She has type 2 diabetes mellitus. Onset time: diagnosed at approx age of 33. Her disease course has been worsening. There are no hypoglycemic associated  symptoms. Associated symptoms include fatigue and foot paresthesias. Pertinent negatives for diabetes include no polyuria (on Jardiance ) and no weight loss. There are no hypoglycemic complications. There are no diabetic complications. Risk factors for coronary artery disease include diabetes mellitus, family history, obesity, hypertension and sedentary lifestyle. Current diabetic treatment includes oral agent (monotherapy) (and Mounjaro ). She is compliant with treatment most of the time. Her  weight is stable. She is following a generally unhealthy diet. When asked about meal planning, she reported none. She has not had a previous visit with a dietitian. She rarely participates in exercise. Her home blood glucose trend is increasing steadily. Her breakfast blood glucose range is generally >200 mg/dl. Her lunch blood glucose range is generally >200 mg/dl. Her dinner blood glucose range is generally >200 mg/dl. Her bedtime blood glucose range is generally >200 mg/dl. Her overall blood glucose range is >200 mg/dl. (She presents today with her CGM showing gross hyperglycemia overall (using Stelo).  Her POCT A1c today is 11.4%, increasing from last visit of 11.2%.  Analysis of her CGM shows TIR 1%, TAR 99%, TBR 0% with a GMI of 10.8%.) An ACE inhibitor/angiotensin II receptor blocker is not being taken. She does not see a podiatrist.Eye exam is current.     Review of systems  Constitutional: + stable body weight, current Body mass index is 41.3 kg/m., no fatigue, no subjective hyperthermia, no subjective hypothermia Eyes: no blurry vision, no xerophthalmia ENT: no sore throat, no nodules palpated in throat, no dysphagia/odynophagia, no hoarseness Cardiovascular: no chest pain, no shortness of breath, no palpitations, no leg swelling Respiratory: no cough, no shortness of breath Gastrointestinal: + intermittent nausea and gas (r/t Mounjaro  injections)-resolved since stopping Musculoskeletal: no muscle/joint  aches Skin: no rashes, no hyperemia, + brittle nails Neurological: no tremors, no numbness, no tingling, no dizziness Psychiatric: no depression, no anxiety  Objective:     BP 122/84 (BP Location: Left Arm, Patient Position: Sitting, Cuff Size: Large)   Pulse (!) 110   Ht 5' 1 (1.549 m)   Wt 218 lb 9.6 oz (99.2 kg)   LMP 06/16/2021 (Approximate)   BMI 41.30 kg/m   Wt Readings from Last 3 Encounters:  04/13/24 218 lb 9.6 oz (99.2 kg)  03/10/24 215 lb 6 oz (97.7 kg)  01/06/24 223 lb 9.6 oz (101.4 kg)     BP Readings from Last 3 Encounters:  04/13/24 122/84  03/10/24 126/82  01/06/24 122/84     Physical Exam- Limited  Constitutional:  Body mass index is 41.3 kg/m. , not in acute distress, normal state of mind Eyes:  EOMI, no exophthalmos Musculoskeletal: no gross deformities, strength intact in all four extremities, no gross restriction of joint movements Skin:  no rashes, no hyperemia Neurological: no tremor with outstretched hands   Diabetic Foot Exam - Simple   No data filed     CMP ( most recent) CMP     Component Value Date/Time   NA 138 11/22/2023 0910   K 4.4 11/22/2023 0910   CL 103 11/22/2023 0910   CO2 19 (L) 11/22/2023 0910   GLUCOSE 293 (H) 11/22/2023 0910   GLUCOSE 118 (H) 12/08/2022 0937   Moreno 9 11/22/2023 0910   CREATININE 0.65 11/22/2023 0910   CREATININE 0.60 02/23/2014 0822   CALCIUM  9.0 11/22/2023 0910   PROT 6.5 11/22/2023 0910   ALBUMIN 4.1 11/22/2023 0910   AST 43 (H) 11/22/2023 0910   ALT 26 11/22/2023 0910   ALKPHOS 106 11/22/2023 0910   BILITOT <0.2 11/22/2023 0910   GFR 105.42 12/08/2022 0937   EGFR 104 11/22/2023 0910   GFRNONAA 98 12/11/2016 0956     Diabetic Labs (most recent): Lab Results  Component Value Date   HGBA1C 11.2 (A) 01/06/2024   HGBA1C 9.7 (A) 09/29/2023   HGBA1C 7.6 (A) 06/08/2023     Lipid Panel ( most recent) Lipid Panel  Component Value Date/Time   CHOL 143 11/22/2023 0910   TRIG 314 (H)  11/22/2023 0910   HDL 32 (L) 11/22/2023 0910   CHOLHDL 4.5 (H) 11/22/2023 0910   CHOLHDL 3 12/08/2022 0937   VLDL 25.6 12/08/2022 0937   LDLCALC 62 11/22/2023 0910   LDLDIRECT 62.0 04/17/2022 0808   LABVLDL 49 (H) 11/22/2023 0910      Lab Results  Component Value Date   TSH 0.688 04/07/2024   TSH 6.780 (H) 11/22/2023   TSH 1.570 05/28/2023   TSH 0.45 01/26/2023   TSH 0.02 (L) 12/08/2022   TSH 0.90 07/30/2022   TSH 14.46 (H) 06/09/2022   TSH 0.88 11/24/2021   TSH 0.62 04/11/2021   TSH 0.02 (L) 01/28/2021   FREET4 1.67 04/07/2024   FREET4 1.17 11/22/2023   FREET4 1.23 05/28/2023           Assessment & Plan:   1) Type 2 diabetes mellitus without complication, without long-term current use of insulin (HCC)  She presents today with her CGM showing gross hyperglycemia overall (using Stelo).  Her POCT A1c today is 11.4%, increasing from last visit of 11.2%.  Analysis of her CGM shows TIR 1%, TAR 99%, TBR 0% with a GMI of 10.8%.  - Tina Moreno has currently uncontrolled symptomatic type 2 DM since 58 years of age.  -Recent labs reviewed.  - I had a long discussion with her about the progressive nature of diabetes and the pathology behind its complications. -her diabetes is not currently complicated but she remains at a high risk for more acute and chronic complications which include CAD, CVA, CKD, retinopathy, and neuropathy. These are all discussed in detail with her.  The following Lifestyle Medicine recommendations according to American College of Lifestyle Medicine [facility]) were discussed and offered to patient and she agrees to start the journey:  A. Whole Foods, Plant-based plate comprising of fruits and vegetables, plant-based proteins, whole-grain carbohydrates was discussed in detail with the patient.   A list for source of those nutrients were also provided to the patient.  Patient will use only water or unsweetened tea for hydration. B.  The need to stay away from  risky substances including alcohol, smoking; obtaining 7 to 9 hours of restorative sleep, at least 150 minutes of moderate intensity exercise weekly, the importance of healthy social connections,  and stress reduction techniques were discussed. C.  A full color page of  Calorie density of various food groups per pound showing examples of each food groups was provided to the patient.  - Nutritional counseling repeated at each appointment due to patients tendency to fall back in to old habits.  - The patient admits there is a room for improvement in their diet and drink choices. -  Suggestion is made for the patient to avoid simple carbohydrates from their diet including Cakes, Sweet Desserts / Pastries, Ice Cream, Soda (diet and regular), Sweet Tea, Candies, Chips, Cookies, Sweet Pastries, Store Bought Juices, Alcohol in Excess of 1-2 drinks a day, Artificial Sweeteners, Coffee Creamer, and Sugar-free Products. This will help patient to have stable blood glucose profile and potentially avoid unintended weight gain.   - I encouraged the patient to switch to unprocessed or minimally processed complex starch and increased protein intake (animal or plant source), fruits, and vegetables.   - Patient is advised to stick to a routine mealtimes to eat 3 meals a day and avoid unnecessary snacks (to snack only to correct hypoglycemia).  - I have  approached her with the following individualized plan to manage her diabetes and patient agrees:   -She is advised to continue her Metformin  1000 mg ER po daily with breakfast and increase Lantus  to 50 units SQ nightly.  We discussed other medications to bring down glucose and ultimately settled on prandial insulin.  I started her on Humalog  10-16 units TID with meals if glucose is above 90 and she is eating (Specific instructions on how to titrate insulin dosage based on glucose readings given to patient in writing).  She demonstrated ability to properly dose meal time  insulin with me today.  -she is encouraged to continue monitoring glucose 4 times daily (using her CGM), before breakfast and before bed, and to call the clinic if she has readings less than 70 or above 300 for 3 tests in a row.   I resent for Libre 3 plus sensors to her pharmacy (previously denied due to not being on MDI).  - Adjustment parameters are given to her for hypo and hyperglycemia in writing.  - her Jardiance  was previously discontinued, risk outweighs benefit for this patient (given history of yeast infections in groin and lack of CHF or kidney disease).  - Specific targets for  A1c; LDL, HDL, and Triglycerides were discussed with the patient.  2) Blood Pressure /Hypertension:  her blood pressure is controlled to target without the use of antihypertensive medications.  3) Lipids/Hyperlipidemia:    Review of her recent lipid panel from 11/22/23 showed controlled LDL at 62 and significantly elevated triglycerides of 314.  she is advised to continue Lipitor 40 mg daily at bedtime.  Side effects and precautions discussed with her.  4)  Weight/Diet:  her Body mass index is 41.3 kg/m.  -  clearly complicating her diabetes care.   she is a candidate for weight loss. I discussed with her the fact that loss of 5 - 10% of her  current body weight will have the most impact on her diabetes management.  Exercise, and detailed carbohydrates information provided  -  detailed on discharge instructions.  5) Hypothyroidism-suspect r/t Hashimotos She has family history of hypothyroidism.  She has never had any imaging of her thyroid  in the past.   Her previsit TFTs are consistent with appropriate hormone replacement.  She notes she switched back to night time administration as she was frequently forgetting it in the morning.  She felt it was better to take it every day and not all be absorbed than to not take it at all.   She is advised to continue Levothyroxine  to 175 mcg po daily.       - The  correct intake of thyroid  hormone (Levothyroxine , Synthroid ), is on empty stomach first thing in the morning, with water, separated by at least 30 minutes from breakfast and other medications,  and separated by more than 4 hours from calcium , iron, multivitamins, acid reflux medications (PPIs).  - This medication is a life-long medication and will be needed to correct thyroid  hormone imbalances for the rest of your life.  The dose may change from time to time, based on thyroid  blood work.  - It is extremely important to be consistent taking this medication, near the same time each morning.  -AVOID TAKING PRODUCTS CONTAINING BIOTIN (commonly found in Hair, Skin, Nails vitamins) AS IT INTERFERES WITH THE VALIDITY OF THYROID  FUNCTION BLOOD TESTS.  6) Chronic Care/Health Maintenance: -she is not on ACEI/ARB and is on Statin medications and is encouraged to initiate and continue  to follow up with Ophthalmology, Dentist, Podiatrist at least yearly or according to recommendations, and advised to stay away from smoking. I have recommended yearly flu vaccine and pneumonia vaccine at least every 5 years; moderate intensity exercise for up to 150 minutes weekly; and sleep for at least 7 hours a day.  - she is advised to maintain close follow up with Gretel App, NP for primary care needs, as well as her other providers for optimal and coordinated care.     I spent  43  minutes in the care of the patient today including review of labs from CMP, Lipids, Thyroid  Function, Hematology (current and previous including abstractions from other facilities); face-to-face time discussing  her blood glucose readings/logs, discussing hypoglycemia and hyperglycemia episodes and symptoms, medications doses, her options of short and long term treatment based on the latest standards of care / guidelines;  discussion about incorporating lifestyle medicine;  and documenting the encounter. Risk reduction counseling performed per  USPSTF guidelines to reduce obesity and cardiovascular risk factors.     Please refer to Patient Instructions for Blood Glucose Monitoring and Insulin/Medications Dosing Guide  in media tab for additional information. Please  also refer to  Patient Self Inventory in the Media  tab for reviewed elements of pertinent patient history.  Tina Moreno participated in the discussions, expressed understanding, and voiced agreement with the above plans.  All questions were answered to her satisfaction. she is encouraged to contact clinic should she have any questions or concerns prior to her return visit.     Follow up plan: - Return in about 3 months (around 07/14/2024) for Diabetes F/U with A1c in office, No previsit labs, Bring meter and logs.   Benton Rio, [facility] [address] [place], [place] [address] Phone: [phone] Fax: [phone]  04/13/2024, 4:27 PM

## 2024-04-14 NOTE — Telephone Encounter (Signed)
 Addressed in separate encounter- PA started.

## 2024-04-17 ENCOUNTER — Other Ambulatory Visit: Payer: Self-pay

## 2024-04-17 DIAGNOSIS — E7800 Pure hypercholesterolemia, unspecified: Secondary | ICD-10-CM

## 2024-04-17 MED ORDER — ATORVASTATIN CALCIUM 40 MG PO TABS: 40.0000 mg | ORAL_TABLET | Freq: Every day | ORAL | 3 refills | Status: AC

## 2024-04-26 NOTE — Telephone Encounter (Signed)
 PA was cancelled due to denial already on file.

## 2024-05-03 NOTE — Telephone Encounter (Signed)
 As noted in July by Benton, the patient is now injecting insulin 4 times daily. This information may help get the approval. May we ask that this be sent in?

## 2024-05-03 NOTE — Telephone Encounter (Signed)
 Yes, if you would please. Thank You.

## 2024-05-04 NOTE — Telephone Encounter (Signed)
 Per advocate, insurance is allowing a resubmission with new information.

## 2024-06-14 ENCOUNTER — Ambulatory Visit: Admitting: Nurse Practitioner

## 2024-06-14 ENCOUNTER — Encounter: Payer: Self-pay | Admitting: Nurse Practitioner

## 2024-06-14 VITALS — BP 128/88 | HR 71 | Temp 98.1°F | Ht 61.0 in | Wt 216.2 lb

## 2024-06-14 DIAGNOSIS — E039 Hypothyroidism, unspecified: Secondary | ICD-10-CM | POA: Diagnosis not present

## 2024-06-14 DIAGNOSIS — E7800 Pure hypercholesterolemia, unspecified: Secondary | ICD-10-CM

## 2024-06-14 DIAGNOSIS — E119 Type 2 diabetes mellitus without complications: Secondary | ICD-10-CM

## 2024-06-14 DIAGNOSIS — Z6841 Body Mass Index (BMI) 40.0 and over, adult: Secondary | ICD-10-CM

## 2024-06-14 DIAGNOSIS — Z7984 Long term (current) use of oral hypoglycemic drugs: Secondary | ICD-10-CM

## 2024-06-14 DIAGNOSIS — J4520 Mild intermittent asthma, uncomplicated: Secondary | ICD-10-CM

## 2024-06-14 MED ORDER — FLUTICASONE FUROATE-VILANTEROL 100-25 MCG/ACT IN AEPB: INHALATION_SPRAY | RESPIRATORY_TRACT | 5 refills | Status: DC

## 2024-06-14 NOTE — Progress Notes (Signed)
 Leron Glance, NP-C Phone: [phone]  Tina Moreno is a 58 y.o. female who presents today for follow up.   Discussed the use of AI scribe software for clinical note transcription with the patient, who gave verbal consent to proceed.  History of Present Illness   Tina Moreno is a 58 year old female with obesity and type 2 diabetes who presents for a three-month follow-up.  She has been taking Contrave  for weight management, which helps curb her appetite, particularly with binge eating. Despite this, she has not noticed significant weight loss, estimating a loss of about five pounds. She is conscious of her diet, focusing on high protein and low carb intake, and eats a lot of salads. However, she struggles with evening snacking, sometimes opting for chips instead of healthier options like cucumbers.  Her type 2 diabetes is managed with Lantus  50 units, metformin , and Humalog  on a sliding scale with meals. Her average blood sugar is around 202 mg/dL, with a recent J8r of 88.5%.   She is on levothyroxine  175 mcg daily for thyroid  management, with recent tests showing normal thyroid  function. No heart palpitations or temperature intolerance, although she notes feeling hot easily when sitting outside.  She takes atorvastatin  for cholesterol management and uses Breo as an inhaler.  She experiences tinnitus, which she attributes to Wellbutrin , describing it as 'loud' but manageable when there is background noise. No hand tremors.      Social History   Tobacco Use  Smoking Status Never  Smokeless Tobacco Never    Current Outpatient Medications on File Prior to Visit  Medication Sig Dispense Refill   albuterol  (VENTOLIN  HFA) 108 (90 Base) MCG/ACT inhaler Inhale 2 puffs into the lungs every 6 (six) hours as needed for wheezing or shortness of breath. 1 each 2   ALPRAZolam  (XANAX ) 0.25 MG tablet Take 1 tablet (0.25 mg total) by mouth daily as needed for anxiety. 30 tablet 0    atorvastatin  (LIPITOR) 40 MG tablet Take 1 tablet (40 mg total) by mouth daily. 90 tablet 3   carisoprodol  (SOMA ) 350 MG tablet Take 1 tablet (350 mg total) by mouth 3 (three) times daily as needed for muscle spasms. 30 tablet 0   esomeprazole (NEXIUM) 20 MG capsule Take 20 mg by mouth daily at 12 noon.     ibuprofen (ADVIL,MOTRIN) 200 MG tablet Take 200 mg by mouth every 6 (six) hours as needed.     insulin glargine  (LANTUS  SOLOSTAR) 100 UNIT/ML Solostar Pen Inject 50 Units into the skin at bedtime. 45 mL 3   insulin lispro  (HUMALOG  KWIKPEN) 200 UNIT/ML KwikPen Inject 10-16 Units into the skin 3 (three) times daily before meals. 30 mL 3   Insulin Pen Needle (PEN NEEDLES) 31G X 5 MM MISC Use to inject insulin 4 times daily 300 each 3   levothyroxine  (SYNTHROID ) 175 MCG tablet Take 1 tablet (175 mcg total) by mouth daily before breakfast. 90 tablet 1   metFORMIN  (GLUCOPHAGE -XR) 500 MG 24 hr tablet Take 2 tablets (1,000 mg total) by mouth daily with breakfast. 180 tablet 3   Naltrexone -buPROPion  HCl ER 8-90 MG TB12 Start 1 tablet every morning for 7 days, then 1 tablet twice daily for 7 days, then 2 tablets every morning and one in the evening 120 tablet 3   Continuous Glucose Sensor (FREESTYLE LIBRE 3 PLUS SENSOR) MISC Change sensor every 15 days. 6 each 3   No current facility-administered medications on file prior to visit.  ROS see history of present illness  Objective  Physical Exam Vitals:   06/14/24 0908  BP: 128/88  Pulse: 71  Temp: 98.1 F (36.7 C)  SpO2: 96%    BP Readings from Last 3 Encounters:  06/14/24 128/88  04/13/24 122/84  03/10/24 126/82   Wt Readings from Last 3 Encounters:  06/14/24 216 lb 3.2 oz (98.1 kg)  04/13/24 218 lb 9.6 oz (99.2 kg)  03/10/24 215 lb 6 oz (97.7 kg)    Physical Exam Constitutional:      General: She is not in acute distress.    Appearance: Normal appearance.  HENT:     Head: Normocephalic.  Cardiovascular:     Rate and  Rhythm: Normal rate and regular rhythm.     Heart sounds: Normal heart sounds.  Pulmonary:     Effort: Pulmonary effort is normal.     Breath sounds: Normal breath sounds.  Skin:    General: Skin is warm and dry.  Neurological:     General: No focal deficit present.     Mental Status: She is alert.  Psychiatric:        Mood and Affect: Mood normal.        Behavior: Behavior normal.      Assessment/Plan: Please see individual problem list.   Type 2 diabetes mellitus without complication, without long-term current use of insulin (HCC) Assessment & Plan: Managed with Lantus , metformin , and Humalog . A1c is 11.4% with an average glucose of 202 mg/dL. No hypoglycemia, but she experiences low sugar symptoms at higher levels. Continue Lantus  50 units daily, metformin  as prescribed, and Humalog  with meals per sliding scale. Emphasize dietary modifications and exercise. Follow up with Endo as scheduled.    Morbid obesity with BMI of 40.0-44.9, adult [facility]) Assessment & Plan: Managed with Contrave , resulting in some appetite reduction but minimal weight loss. Evening snacking remains a challenge. Continue Contrave  as prescribed. Encourage dietary modifications focusing on portion control and reducing evening snacking. Reinforce the importance of exercise.   Hypothyroidism, unspecified type Assessment & Plan: Well-managed on levothyroxine , though she experiences heat intolerance outdoors. Continue levothyroxine  175 mcg daily. Follow up with Endo.    Elevated LDL cholesterol level Assessment & Plan: Managed with atorvastatin . Continue atorvastatin  40 mg daily.   Mild intermittent reactive airway disease without complication Assessment & Plan: Seasonally. Controlled with Breo inhaler daily. Continue. Refills sent.   Orders: -     Fluticasone  Furoate-Vilanterol; INHALE 1 PUFF BY MOUTH EVERY DAY  Dispense: 60 each; Refill: 5      Return in about 3 months (around 09/13/2024) for  Follow up.   Leron Glance, NP-C [facility]

## 2024-06-27 ENCOUNTER — Encounter: Payer: Self-pay | Admitting: Nurse Practitioner

## 2024-06-27 NOTE — Assessment & Plan Note (Signed)
 Managed with atorvastatin . - Continue atorvastatin  40 mg daily.

## 2024-06-27 NOTE — Assessment & Plan Note (Signed)
 Managed with Lantus , metformin , and Humalog . A1c is 11.4% with an average glucose of 202 mg/dL. No hypoglycemia, but she experiences low sugar symptoms at higher levels. Continue Lantus  50 units daily, metformin  as prescribed, and Humalog  with meals per sliding scale. Emphasize dietary modifications and exercise. Follow up with Endo as scheduled.

## 2024-06-27 NOTE — Assessment & Plan Note (Signed)
 Seasonally. Controlled with Breo inhaler daily. Continue. Refills sent.

## 2024-06-27 NOTE — Assessment & Plan Note (Signed)
 Well-managed on levothyroxine , though she experiences heat intolerance outdoors. Continue levothyroxine  175 mcg daily. Follow up with Endo.

## 2024-06-27 NOTE — Assessment & Plan Note (Signed)
 Managed with Contrave , resulting in some appetite reduction but minimal weight loss. Evening snacking remains a challenge. Continue Contrave  as prescribed. Encourage dietary modifications focusing on portion control and reducing evening snacking. Reinforce the importance of exercise.

## 2024-07-19 ENCOUNTER — Ambulatory Visit: Admitting: Nurse Practitioner

## 2024-07-19 ENCOUNTER — Encounter: Payer: Self-pay | Admitting: Nurse Practitioner

## 2024-07-19 VITALS — BP 108/84 | HR 88 | Ht 61.0 in | Wt 223.0 lb

## 2024-07-19 DIAGNOSIS — Z7984 Long term (current) use of oral hypoglycemic drugs: Secondary | ICD-10-CM

## 2024-07-19 DIAGNOSIS — E119 Type 2 diabetes mellitus without complications: Secondary | ICD-10-CM | POA: Diagnosis not present

## 2024-07-19 DIAGNOSIS — E039 Hypothyroidism, unspecified: Secondary | ICD-10-CM

## 2024-07-19 DIAGNOSIS — Z7985 Long-term (current) use of injectable non-insulin antidiabetic drugs: Secondary | ICD-10-CM

## 2024-07-19 DIAGNOSIS — Z794 Long term (current) use of insulin: Secondary | ICD-10-CM

## 2024-07-19 LAB — POCT GLYCOSYLATED HEMOGLOBIN (HGB A1C): Hemoglobin A1C: 9.2 % — AB (ref 4.0–5.6)

## 2024-07-19 MED ORDER — HUMALOG KWIKPEN 200 UNIT/ML ~~LOC~~ SOPN: 14.0000 [IU] | PEN_INJECTOR | Freq: Three times a day (TID) | SUBCUTANEOUS | 3 refills | Status: AC

## 2024-07-19 MED ORDER — LEVOTHYROXINE SODIUM 175 MCG PO TABS: 175.0000 ug | ORAL_TABLET | Freq: Every day | ORAL | 1 refills | Status: AC

## 2024-07-19 MED ORDER — LANTUS SOLOSTAR 100 UNIT/ML ~~LOC~~ SOPN: 60.0000 [IU] | PEN_INJECTOR | Freq: Every day | SUBCUTANEOUS | 3 refills | Status: AC

## 2024-07-19 MED ORDER — FREESTYLE LIBRE 3 PLUS SENSOR MISC: 3 refills | Status: AC

## 2024-07-19 MED ORDER — METFORMIN HCL ER 500 MG PO TB24: 1000.0000 mg | ORAL_TABLET | Freq: Every day | ORAL | 3 refills | Status: AC

## 2024-07-19 NOTE — Progress Notes (Signed)
 Endocrinology Follow Up Note       07/19/2024, 4:13 PM   Subjective:    Patient ID: Tina Moreno, female    DOB: [birth year].  Tina Moreno is being seen in consultation for management of currently uncontrolled symptomatic diabetes requested by  Gretel App, NP.   Past Medical History:  Diagnosis Date   Abnormal Pap smear of cervix    Anxiety    Asthma    Back pain    Cervical dysplasia    COVID-19 09/05/2021   Depression    Elevated WBCs    NEGATIVE HEM W/U   GERD (gastroesophageal reflux disease)    Hypothyroid     Past Surgical History:  Procedure Laterality Date   COLONOSCOPY WITH PROPOFOL  N/A 10/01/2020   Procedure: COLONOSCOPY WITH PROPOFOL ;  Surgeon: Unk Corinn Skiff, MD;  Location: [facility] ENDOSCOPY;  Service: Gastroenterology;  Laterality: N/A;   COLPOSCOPY     GYNECOLOGIC CRYOSURGERY      Social History   Socioeconomic History   Marital status: Single    Spouse name: Not on file   Number of children: Not on file   Years of education: Not on file   Highest education level: Bachelor's degree (e.g., BA, AB, BS)  Occupational History   Not on file  Tobacco Use   Smoking status: Never   Smokeless tobacco: Never  Vaping Use   Vaping status: Never Used  Substance and Sexual Activity   Alcohol use: Yes    Comment: OCCASSIONALLY   Drug use: No   Sexual activity: Not Currently  Other Topics Concern   Not on file  Social History Narrative   Not on file   Social Drivers of Health   Financial Resource Strain: Low Risk  (06/13/2024)   Overall Financial Resource Strain (CARDIA)    Difficulty of Paying Living Expenses: Not very hard  Food Insecurity: No Food Insecurity (06/13/2024)   Hunger Vital Sign    Worried About Running Out of Food in the Last Year: Never true    Ran Out of Food in the Last Year: Never true  Transportation Needs: No Transportation Needs (06/13/2024)    PRAPARE - Administrator, Civil Service (Medical): No    Lack of Transportation (Non-Medical): No  Physical Activity: Insufficiently Active (06/13/2024)   Exercise Vital Sign    Days of Exercise per Week: 1 day    Minutes of Exercise per Session: 20 min  Stress: Stress Concern Present (06/13/2024)   [employer] of Occupational Health - Occupational Stress Questionnaire    Feeling of Stress: To some extent  Social Connections: Socially Isolated (06/13/2024)   Social Connection and Isolation Panel    Frequency of Communication with Friends and Family: Once a week    Frequency of Social Gatherings with Friends and Family: Once a week    Attends Religious Services: Never    Database Administrator or Organizations: Yes    Attends Banker Meetings: 1 to 4 times per year    Marital Status: Never married    Family History  Problem Relation Age of Onset   Parkinsonism Father    Diabetes Father  Hypertension Father    Cancer Maternal Aunt        SOFT CELL SARCOMA   Cancer Maternal Uncle        TESTICULAR   Cancer Paternal Grandmother        COLON   Cancer Son        TESTICULAR    Outpatient Encounter Medications as of 07/19/2024  Medication Sig   albuterol  (VENTOLIN  HFA) 108 (90 Base) MCG/ACT inhaler Inhale 2 puffs into the lungs every 6 (six) hours as needed for wheezing or shortness of breath.   ALPRAZolam  (XANAX ) 0.25 MG tablet Take 1 tablet (0.25 mg total) by mouth daily as needed for anxiety.   atorvastatin  (LIPITOR) 40 MG tablet Take 1 tablet (40 mg total) by mouth daily.   carisoprodol  (SOMA ) 350 MG tablet Take 1 tablet (350 mg total) by mouth 3 (three) times daily as needed for muscle spasms.   esomeprazole (NEXIUM) 20 MG capsule Take 20 mg by mouth daily at 12 noon.   fluticasone  furoate-vilanterol (BREO ELLIPTA ) 100-25 MCG/ACT AEPB INHALE 1 PUFF BY MOUTH EVERY DAY   ibuprofen (ADVIL,MOTRIN) 200 MG tablet Take 200 mg by mouth every 6 (six) hours  as needed.   Insulin Pen Needle (PEN NEEDLES) 31G X 5 MM MISC Use to inject insulin 4 times daily   Naltrexone -buPROPion  HCl ER 8-90 MG TB12 Start 1 tablet every morning for 7 days, then 1 tablet twice daily for 7 days, then 2 tablets every morning and one in the evening   [DISCONTINUED] Continuous Glucose Sensor (FREESTYLE LIBRE 3 PLUS SENSOR) MISC Change sensor every 15 days.   [DISCONTINUED] insulin glargine  (LANTUS  SOLOSTAR) 100 UNIT/ML Solostar Pen Inject 50 Units into the skin at bedtime.   [DISCONTINUED] insulin lispro  (HUMALOG  KWIKPEN) 200 UNIT/ML KwikPen Inject 10-16 Units into the skin 3 (three) times daily before meals.   [DISCONTINUED] levothyroxine  (SYNTHROID ) 175 MCG tablet Take 1 tablet (175 mcg total) by mouth daily before breakfast.   [DISCONTINUED] metFORMIN  (GLUCOPHAGE -XR) 500 MG 24 hr tablet Take 2 tablets (1,000 mg total) by mouth daily with breakfast.   Continuous Glucose Sensor (FREESTYLE LIBRE 3 PLUS SENSOR) MISC Change sensor every 15 days.   insulin glargine  (LANTUS  SOLOSTAR) 100 UNIT/ML Solostar Pen Inject 60 Units into the skin at bedtime.   insulin lispro  (HUMALOG  KWIKPEN) 200 UNIT/ML KwikPen Inject 14-20 Units into the skin 3 (three) times daily before meals.   levothyroxine  (SYNTHROID ) 175 MCG tablet Take 1 tablet (175 mcg total) by mouth daily before breakfast.   metFORMIN  (GLUCOPHAGE -XR) 500 MG 24 hr tablet Take 2 tablets (1,000 mg total) by mouth daily with breakfast.   No facility-administered encounter medications on file as of 07/19/2024.    ALLERGIES: Allergies  Allergen Reactions   Amoxicillin Other (See Comments)    Dizziness   Other     TREES    VACCINATION STATUS: Immunization History  Administered Date(s) Administered   PFIZER(Purple Top)SARS-COV-2 Vaccination 06/07/2020, 06/28/2020   PNEUMOCOCCAL CONJUGATE-20 04/30/2021   Td 04/30/2021    Diabetes She presents for her follow-up diabetic visit. She has type 2 diabetes mellitus. Onset time:  diagnosed at approx age of 76. Her disease course has been improving. There are no hypoglycemic associated symptoms. Associated symptoms include fatigue and foot paresthesias. Pertinent negatives for diabetes include no polyuria (on Jardiance ) and no weight loss. There are no hypoglycemic complications. There are no diabetic complications. Risk factors for coronary artery disease include diabetes mellitus, family history, obesity, hypertension and sedentary lifestyle. Current  diabetic treatment includes oral agent (monotherapy) and intensive insulin program. She is compliant with treatment most of the time. Her weight is stable. She is following a generally unhealthy diet. When asked about meal planning, she reported none. She has not had a previous visit with a dietitian. She rarely participates in exercise. Her home blood glucose trend is decreasing steadily. Her breakfast blood glucose range is generally >200 mg/dl. Her lunch blood glucose range is generally >200 mg/dl. Her dinner blood glucose range is generally >200 mg/dl. Her bedtime blood glucose range is generally >200 mg/dl. Her overall blood glucose range is >200 mg/dl. (She presents today with her CGM showing improved, yet still above target glycemic profile.  Her POCT A1c today is 9.2%, improving from last A1c of 11.2%.  Analysis of her CGM shows TIR 7%, TAR 93%, TBR 0% with a GMI of 9.7%.  She notes her glucose fluctuates from week to week, will do well for a while and then for no obvious reason, her glucose will shoot up.  ) An ACE inhibitor/angiotensin II receptor blocker is not being taken. She does not see a podiatrist.Eye exam is current.     Review of systems  Constitutional: + stable body weight, current Body mass index is 42.14 kg/m., no fatigue, no subjective hyperthermia, no subjective hypothermia Eyes: no blurry vision, no xerophthalmia ENT: no sore throat, no nodules palpated in throat, no dysphagia/odynophagia, no  hoarseness Cardiovascular: no chest pain, no shortness of breath, no palpitations, no leg swelling Respiratory: no cough, no shortness of breath Gastrointestinal: + intermittent nausea and gas (r/t Mounjaro  injections)-resolved since stopping Musculoskeletal: no muscle/joint aches Skin: no rashes, no hyperemia, + brittle nails Neurological: no tremors, no numbness, no tingling, no dizziness Psychiatric: no depression, no anxiety  Objective:     BP 108/84 (BP Location: Left Arm, Patient Position: Sitting, Cuff Size: Large)   Pulse 88   Ht 5' 1 (1.549 m)   Wt 223 lb (101.2 kg)   LMP 06/16/2021 (Approximate)   BMI 42.14 kg/m   Wt Readings from Last 3 Encounters:  07/19/24 223 lb (101.2 kg)  06/14/24 216 lb 3.2 oz (98.1 kg)  04/13/24 218 lb 9.6 oz (99.2 kg)     BP Readings from Last 3 Encounters:  07/19/24 108/84  06/14/24 128/88  04/13/24 122/84     Physical Exam- Limited  Constitutional:  Body mass index is 42.14 kg/m. , not in acute distress, normal state of mind Eyes:  EOMI, no exophthalmos Musculoskeletal: no gross deformities, strength intact in all four extremities, no gross restriction of joint movements Skin:  no rashes, no hyperemia Neurological: no tremor with outstretched hands   Diabetic Foot Exam - Simple   No data filed     CMP ( most recent) CMP     Component Value Date/Time   NA 138 11/22/2023 0910   K 4.4 11/22/2023 0910   CL 103 11/22/2023 0910   CO2 19 (L) 11/22/2023 0910   GLUCOSE 293 (H) 11/22/2023 0910   GLUCOSE 118 (H) 12/08/2022 0937   BUN 9 11/22/2023 0910   CREATININE 0.65 11/22/2023 0910   CREATININE 0.60 02/23/2014 0822   CALCIUM  9.0 11/22/2023 0910   PROT 6.5 11/22/2023 0910   ALBUMIN 4.1 11/22/2023 0910   AST 43 (H) 11/22/2023 0910   ALT 26 11/22/2023 0910   ALKPHOS 106 11/22/2023 0910   BILITOT <0.2 11/22/2023 0910   GFR 105.42 12/08/2022 0937   EGFR 104 11/22/2023 0910   GFRNONAA 98 12/11/2016  [phone]     Diabetic Labs  (most recent): Lab Results  Component Value Date   HGBA1C 9.2 (A) 07/19/2024   HGBA1C 11.2 (A) 01/06/2024   HGBA1C 9.7 (A) 09/29/2023     Lipid Panel ( most recent) Lipid Panel     Component Value Date/Time   CHOL 143 11/22/2023 0910   TRIG 314 (H) 11/22/2023 0910   HDL 32 (L) 11/22/2023 0910   CHOLHDL 4.5 (H) 11/22/2023 0910   CHOLHDL 3 12/08/2022 0937   VLDL 25.6 12/08/2022 0937   LDLCALC 62 11/22/2023 0910   LDLDIRECT 62.0 04/17/2022 0808   LABVLDL 49 (H) 11/22/2023 0910      Lab Results  Component Value Date   TSH 0.688 04/07/2024   TSH 6.780 (H) 11/22/2023   TSH 1.570 05/28/2023   TSH 0.45 01/26/2023   TSH 0.02 (L) 12/08/2022   TSH 0.90 07/30/2022   TSH 14.46 (H) 06/09/2022   TSH 0.88 11/24/2021   TSH 0.62 04/11/2021   TSH 0.02 (L) 01/28/2021   FREET4 1.67 04/07/2024   FREET4 1.17 11/22/2023   FREET4 1.23 05/28/2023           Assessment & Plan:   1) Type 2 diabetes mellitus without complication, without long-term current use of insulin (HCC)  She presents today with her CGM showing improved, yet still above target glycemic profile.  Her POCT A1c today is 9.2%, improving from last A1c of 11.2%.  Analysis of her CGM shows TIR 7%, TAR 93%, TBR 0% with a GMI of 9.7%.  She notes her glucose fluctuates from week to week, will do well for a while and then for no obvious reason, her glucose will shoot up.    - Tina Moreno has currently uncontrolled symptomatic type 2 DM since 58 years of age.  -Recent labs reviewed.  - I had a long discussion with her about the progressive nature of diabetes and the pathology behind its complications. -her diabetes is not currently complicated but she remains at a high risk for more acute and chronic complications which include CAD, CVA, CKD, retinopathy, and neuropathy. These are all discussed in detail with her.  The following Lifestyle Medicine recommendations according to American College of Lifestyle Medicine [facility])  were discussed and offered to patient and she agrees to start the journey:  A. Whole Foods, Plant-based plate comprising of fruits and vegetables, plant-based proteins, whole-grain carbohydrates was discussed in detail with the patient.   A list for source of those nutrients were also provided to the patient.  Patient will use only water or unsweetened tea for hydration. B.  The need to stay away from risky substances including alcohol, smoking; obtaining 7 to 9 hours of restorative sleep, at least 150 minutes of moderate intensity exercise weekly, the importance of healthy social connections,  and stress reduction techniques were discussed. C.  A full color page of  Calorie density of various food groups per pound showing examples of each food groups was provided to the patient.  - Nutritional counseling repeated/built upon at each appointment.  - The patient admits there is a room for improvement in their diet and drink choices. -  Suggestion is made for the patient to avoid simple carbohydrates from their diet including Cakes, Sweet Desserts / Pastries, Ice Cream, Soda (diet and regular), Sweet Tea, Candies, Chips, Cookies, Sweet Pastries, Store Bought Juices, Alcohol in Excess of 1-2 drinks a day, Artificial Sweeteners, Coffee Creamer, and Sugar-free Products. This will help patient to have  stable blood glucose profile and potentially avoid unintended weight gain.   - I encouraged the patient to switch to unprocessed or minimally processed complex starch and increased protein intake (animal or plant source), fruits, and vegetables.   - Patient is advised to stick to a routine mealtimes to eat 3 meals a day and avoid unnecessary snacks (to snack only to correct hypoglycemia).  - I have approached her with the following individualized plan to manage her diabetes and patient agrees:   -She is advised to continue her Metformin  1000 mg ER po daily, increase Lantus  to 60 units SQ nightly, and  increase Humalog  to 14-20 units TID with meals if glucose is above 90 and she is eating (Specific instructions on how to titrate insulin dosage based on glucose readings given to patient in writing).    -she is encouraged to continue monitoring glucose 4 times daily (using her CGM), before breakfast and before bed, and to call the clinic if she has readings less than 70 or above 300 for 3 tests in a row.     - Adjustment parameters are given to her for hypo and hyperglycemia in writing.  - her Jardiance  was previously discontinued, risk outweighs benefit for this patient (given history of yeast infections in groin and lack of CHF or kidney disease).  She did not tolerate GLP1 in the past, due to GI side effects and ineffectiveness.  - Specific targets for  A1c; LDL, HDL, and Triglycerides were discussed with the patient.  2) Blood Pressure /Hypertension:  her blood pressure is controlled to target without the use of antihypertensive medications.  3) Lipids/Hyperlipidemia:    Review of her recent lipid panel from 11/22/23 showed controlled LDL at 62 and significantly elevated triglycerides of 314.  she is advised to continue Lipitor 40 mg daily at bedtime.  Side effects and precautions discussed with her.  4)  Weight/Diet:  her Body mass index is 42.14 kg/m.  -  clearly complicating her diabetes care.   she is a candidate for weight loss. I discussed with her the fact that loss of 5 - 10% of her  current body weight will have the most impact on her diabetes management.  Exercise, and detailed carbohydrates information provided  -  detailed on discharge instructions.  5) Hypothyroidism-suspect r/t Hashimotos She has family history of hypothyroidism.  She has never had any imaging of her thyroid  in the past.   There are no recent TFTs to review.  She notes she switched back to night time administration as she was frequently forgetting it in the morning.  She felt it was better to take it every day  and not all be absorbed than to not take it at all.   She is advised to continue Levothyroxine  to 175 mcg po daily.   She has appt coming up with PCP in January, so I will defer labs to them.    - The correct intake of thyroid  hormone (Levothyroxine , Synthroid ), is on empty stomach first thing in the morning, with water, separated by at least 30 minutes from breakfast and other medications,  and separated by more than 4 hours from calcium , iron, multivitamins, acid reflux medications (PPIs).  - This medication is a life-long medication and will be needed to correct thyroid  hormone imbalances for the rest of your life.  The dose may change from time to time, based on thyroid  blood work.  - It is extremely important to be consistent taking this medication, near the  same time each morning.  -AVOID TAKING PRODUCTS CONTAINING BIOTIN (commonly found in Hair, Skin, Nails vitamins) AS IT INTERFERES WITH THE VALIDITY OF THYROID  FUNCTION BLOOD TESTS.  6) Chronic Care/Health Maintenance: -she is not on ACEI/ARB and is on Statin medications and is encouraged to initiate and continue to follow up with Ophthalmology, Dentist, Podiatrist at least yearly or according to recommendations, and advised to stay away from smoking. I have recommended yearly flu vaccine and pneumonia vaccine at least every 5 years; moderate intensity exercise for up to 150 minutes weekly; and sleep for at least 7 hours a day.  - she is advised to maintain close follow up with Gretel App, NP for primary care needs, as well as her other providers for optimal and coordinated care.     I spent  36  minutes in the care of the patient today including review of labs from CMP, Lipids, Thyroid  Function, Hematology (current and previous including abstractions from other facilities); face-to-face time discussing  her blood glucose readings/logs, discussing hypoglycemia and hyperglycemia episodes and symptoms, medications doses, her options of  short and long term treatment based on the latest standards of care / guidelines;  discussion about incorporating lifestyle medicine;  and documenting the encounter. Risk reduction counseling performed per USPSTF guidelines to reduce obesity and cardiovascular risk factors.     Please refer to Patient Instructions for Blood Glucose Monitoring and Insulin/Medications Dosing Guide  in media tab for additional information. Please  also refer to  Patient Self Inventory in the Media  tab for reviewed elements of pertinent patient history.  Tina Moreno participated in the discussions, expressed understanding, and voiced agreement with the above plans.  All questions were answered to her satisfaction. she is encouraged to contact clinic should she have any questions or concerns prior to her return visit.     Follow up plan: - Return in about 3 months (around 10/19/2024) for Diabetes F/U with A1c in office, No previsit labs, Bring meter and logs.   Benton Rio, [facility] [address] [place], [place] [address] Phone: [phone] Fax: [phone]  07/19/2024, 4:13 PM

## 2024-07-21 DIAGNOSIS — H2513 Age-related nuclear cataract, bilateral: Secondary | ICD-10-CM | POA: Diagnosis not present

## 2024-07-21 DIAGNOSIS — D3132 Benign neoplasm of left choroid: Secondary | ICD-10-CM | POA: Diagnosis not present

## 2024-07-21 DIAGNOSIS — E119 Type 2 diabetes mellitus without complications: Secondary | ICD-10-CM | POA: Diagnosis not present

## 2024-07-21 LAB — OPHTHALMOLOGY REPORT-SCANNED

## 2024-09-27 ENCOUNTER — Ambulatory Visit: Admitting: Nurse Practitioner

## 2024-09-27 ENCOUNTER — Encounter: Payer: Self-pay | Admitting: Nurse Practitioner

## 2024-09-27 ENCOUNTER — Ambulatory Visit: Payer: Self-pay | Admitting: Nurse Practitioner

## 2024-09-27 VITALS — BP 124/88 | HR 87 | Temp 98.4°F | Ht 61.0 in | Wt 232.2 lb

## 2024-09-27 DIAGNOSIS — E119 Type 2 diabetes mellitus without complications: Secondary | ICD-10-CM | POA: Diagnosis not present

## 2024-09-27 DIAGNOSIS — Z794 Long term (current) use of insulin: Secondary | ICD-10-CM | POA: Diagnosis not present

## 2024-09-27 DIAGNOSIS — Z7984 Long term (current) use of oral hypoglycemic drugs: Secondary | ICD-10-CM

## 2024-09-27 DIAGNOSIS — F32A Depression, unspecified: Secondary | ICD-10-CM | POA: Diagnosis not present

## 2024-09-27 DIAGNOSIS — Z6841 Body Mass Index (BMI) 40.0 and over, adult: Secondary | ICD-10-CM

## 2024-09-27 DIAGNOSIS — M2550 Pain in unspecified joint: Secondary | ICD-10-CM

## 2024-09-27 DIAGNOSIS — F419 Anxiety disorder, unspecified: Secondary | ICD-10-CM

## 2024-09-27 DIAGNOSIS — E559 Vitamin D deficiency, unspecified: Secondary | ICD-10-CM

## 2024-09-27 DIAGNOSIS — E039 Hypothyroidism, unspecified: Secondary | ICD-10-CM

## 2024-09-27 LAB — TSH: TSH: 57.73 u[IU]/mL — ABNORMAL HIGH (ref 0.35–5.50)

## 2024-09-27 NOTE — Progress Notes (Signed)
 Hi Kacy!  I will plan to discuss with her next month.  I can certainly try and help manage her thyroid .   I suspect her TSH is elevated due to varying absorption as she takes her thyroid  pill at night (she told me this last visit).  I will plan to repeat her labs just prior to the appointment and add on a few others to assess deeper.  Thanks, [employer]

## 2024-09-27 NOTE — Progress Notes (Signed)
 " Leron Glance, NP-C Phone: [phone]  Tina Moreno is a 59 y.o. female who presents today for follow up.   Discussed the use of AI scribe software for clinical note transcription with the patient, who gave verbal consent to proceed.  History of Present Illness   Tina  D Moreno is a 59 year old female with type 2 diabetes and depression who presents with weight gain and dietary concerns.  She has gained almost 10 pounds since her last endocrinology visit in October, increasing from 223 pounds to 232 pounds. She struggles with dietary management, describing herself as a 'picky eater' and attempting to broaden her diet by including foods like asparagus. She lacks a regular eating schedule, often skipping breakfast but starting to eat around lunchtime. Her depression contributes to overeating and snacking without satisfaction.  Her type 2 diabetes has shown some improvement, with her A1c decreasing from 11.2 to 9.2. She is on Lantus , Humalog , and metformin , and uses a sensor to monitor her blood sugars. She works from home and notes that her blood sugars are generally stable in the mornings but tend to rise in the evenings when she has more access to food.  She experiences depression, which she feels is currently 'winning.' She has an upcoming appointment with psychiatry. She has previously been on Lexapro and Zoloft but is not currently taking any psychiatric medications. Her depression affects her motivation for physical activity and hobbies.  She reports joint pain in her hips, ankles, and right elbow. Her ankles feel unstable, especially when descending stairs, though she has not fallen. She associates some of the pain with temperature changes. A previous workup for autoimmune conditions in her twenties was negative, but her symptoms have worsened since then. She occasionally takes ibuprofen or Aleve, which sometimes alleviates the pain.  She is currently taking levothyroxine  for her  thyroid  condition. Her Pap smear and colonoscopy are not due until 2028 and 2032, respectively.      Tobacco Use History[1]  Medications Ordered Prior to Encounter[2]   ROS see history of present illness  Objective  Physical Exam Vitals:   09/27/24 0901  BP: 124/88  Pulse: 87  Temp: 98.4 F (36.9 C)  SpO2: 96%    BP Readings from Last 3 Encounters:  09/27/24 124/88  07/19/24 108/84  06/14/24 128/88   Wt Readings from Last 3 Encounters:  09/27/24 232 lb 3.2 oz (105.3 kg)  07/19/24 223 lb (101.2 kg)  06/14/24 216 lb 3.2 oz (98.1 kg)    Physical Exam Constitutional:      General: She is not in acute distress.    Appearance: Normal appearance.  HENT:     Head: Normocephalic.  Cardiovascular:     Rate and Rhythm: Normal rate and regular rhythm.     Heart sounds: Normal heart sounds.  Pulmonary:     Effort: Pulmonary effort is normal.     Breath sounds: Normal breath sounds.  Skin:    General: Skin is warm and dry.  Neurological:     General: No focal deficit present.     Mental Status: She is alert.  Psychiatric:        Mood and Affect: Mood normal.        Behavior: Behavior normal.      Assessment/Plan: Please see individual problem list.  Anxiety and depression Assessment & Plan: Affects eating habits and physical activity. Previous medications were not tolerated. A psychiatry appointment is scheduled. Defer medication management to psychiatry and encourage follow-up.  Morbid obesity with BMI of 40.0-44.9, adult [facility]) Assessment & Plan: Weight increased from 223 lbs to 232 lbs, contributing to diabetes management issues and joint pain. Depression and inactivity are noted. Encourage dietary changes and increased physical activity. Discuss the benefits of mental health management.   Arthralgia of multiple joints Assessment & Plan: Affects multiple joints with possible epicondylitis. Symptoms may worsen with weight gain and temperature changes.  Previous autoimmune workup was inconclusive. Check lab work as outlined and encourage weight management. Consider elbow bands for epicondylitis. Further work up pending lab results.   Orders: -     Analyzer ANA IFA w/RFLX Titer/Pattern,Systemic Autoimmune Panel 1  Hypothyroidism, unspecified type Assessment & Plan: Managed with levothyroxine . Recent endocrinology visit with labs deferred. Check TSH today. Continue Levothyroxine .   Orders: -     TSH  Type 2 diabetes mellitus without complication, without long-term current use of insulin (HCC) Assessment & Plan: A1c decreased from 11.2 to 9.2, with weight gain and suboptimal dietary habits noted. Depression impacts dietary adherence. Continue Lantus , Humalog , and metformin . Encourage a high protein, low carbohydrate diet and regular physical activity. Emphasize mental health management. Follow up with Endocrinology as scheduled.       Return in about 6 weeks (around 11/08/2024) for Follow up.   Leron Glance, NP-C [facility] [place]      [1]  Social History Tobacco Use  Smoking Status Never  Smokeless Tobacco Never  [2]  Current Outpatient Medications on File Prior to Visit  Medication Sig Dispense Refill   albuterol  (VENTOLIN  HFA) 108 (90 Base) MCG/ACT inhaler Inhale 2 puffs into the lungs every 6 (six) hours as needed for wheezing or shortness of breath. 1 each 2   ALPRAZolam  (XANAX ) 0.25 MG tablet Take 1 tablet (0.25 mg total) by mouth daily as needed for anxiety. 30 tablet 0   atorvastatin  (LIPITOR) 40 MG tablet Take 1 tablet (40 mg total) by mouth daily. 90 tablet 3   carisoprodol  (SOMA ) 350 MG tablet Take 1 tablet (350 mg total) by mouth 3 (three) times daily as needed for muscle spasms. 30 tablet 0   Continuous Glucose Sensor (FREESTYLE LIBRE 3 PLUS SENSOR) MISC Change sensor every 15 days. 6 each 3   esomeprazole (NEXIUM) 20 MG capsule Take 20 mg by mouth daily at 12 noon.     ibuprofen  (ADVIL,MOTRIN) 200 MG tablet Take 200 mg by mouth every 6 (six) hours as needed.     insulin glargine  (LANTUS  SOLOSTAR) 100 UNIT/ML Solostar Pen Inject 60 Units into the skin at bedtime. 54 mL 3   insulin lispro  (HUMALOG  KWIKPEN) 200 UNIT/ML KwikPen Inject 14-20 Units into the skin 3 (three) times daily before meals. 60 mL 3   Insulin Pen Needle (PEN NEEDLES) 31G X 5 MM MISC Use to inject insulin 4 times daily 300 each 3   levothyroxine  (SYNTHROID ) 175 MCG tablet Take 1 tablet (175 mcg total) by mouth daily before breakfast. 90 tablet 1   metFORMIN  (GLUCOPHAGE -XR) 500 MG 24 hr tablet Take 2 tablets (1,000 mg total) by mouth daily with breakfast. 180 tablet 3   No current facility-administered medications on file prior to visit.   "

## 2024-10-02 LAB — ANALYZER(R)ANA IFA WITH REFLEX TITER/PATTRN,SYS AUTOIMM PNL1
Anti Nuclear Antibody (ANA): NEGATIVE
Anticardiolipin IgA: <2 [APL'U]/mL
Anticardiolipin IgG: <2 [GPL'U]/mL
Anticardiolipin IgM: <2 [MPL'U]/mL
Beta-2 Glyco 1 IgA: <2 U/mL
Beta-2 Glyco 1 IgM: <2 U/mL
Beta-2 Glyco I IgG: <2 U/mL
C3 Complement: 212 mg/dL — ABNORMAL HIGH (ref 83–193)
C4 Complement: 27 mg/dL (ref 15–57)
Centromere Ab Screen: <1 AI
Chromatin (Nucleosomal) Antibody: <1 AI
Cyclic Citrullin Peptide Ab: <16 U
DNA Ab (DS) Crithidia, IFA: NEGATIVE
ENA SM Ab Ser-aCnc: <1 AI
Jo-1 Autoabs: <1 AI
MUTATED CITRULLINATED VIMENTIN (MCV) AB: <20 U/mL
Rheumatoid Factor (IgA): <5 U
Rheumatoid Factor (IgG): <5 U
Rheumatoid Factor (IgM): <5 U
Ribonucleic Protein(ENA) Antibody, IgG: <1 AI
SM/RNP: <1 AI
SSA (Ro) (ENA) Antibody, IgG: <1 AI
SSB (La) (ENA) Antibody, IgG: <1 AI
Scleroderma (Scl-70) (ENA) Antibody, IgG: <1 AI
Thyroperoxidase Ab SerPl-aCnc: 21 [IU]/mL — ABNORMAL HIGH

## 2024-10-04 ENCOUNTER — Encounter: Payer: Self-pay | Admitting: Nurse Practitioner

## 2024-10-04 NOTE — Assessment & Plan Note (Signed)
 Weight increased from 223 lbs to 232 lbs, contributing to diabetes management issues and joint pain. Depression and inactivity are noted. Encourage dietary changes and increased physical activity. Discuss the benefits of mental health management.

## 2024-10-04 NOTE — Assessment & Plan Note (Signed)
 Affects eating habits and physical activity. Previous medications were not tolerated. A psychiatry appointment is scheduled. Defer medication management to psychiatry and encourage follow-up.

## 2024-10-04 NOTE — Assessment & Plan Note (Addendum)
 Affects multiple joints with possible epicondylitis. Symptoms may worsen with weight gain and temperature changes. Previous autoimmune workup was inconclusive. Check lab work as outlined and encourage weight management. Consider elbow bands for epicondylitis. Further work up pending lab results.

## 2024-10-04 NOTE — Assessment & Plan Note (Signed)
 Managed with levothyroxine . Recent endocrinology visit with labs deferred. Check TSH today. Continue Levothyroxine .

## 2024-10-04 NOTE — Assessment & Plan Note (Signed)
 A1c decreased from 11.2 to 9.2, with weight gain and suboptimal dietary habits noted. Depression impacts dietary adherence. Continue Lantus , Humalog , and metformin . Encourage a high protein, low carbohydrate diet and regular physical activity. Emphasize mental health management. Follow up with Endocrinology as scheduled.

## 2024-10-09 ENCOUNTER — Encounter: Payer: Self-pay | Admitting: Psychiatry

## 2024-10-09 ENCOUNTER — Ambulatory Visit: Admitting: Psychiatry

## 2024-10-09 VITALS — BP 123/84 | HR 96 | Temp 98.7°F | Ht 61.0 in | Wt 231.0 lb

## 2024-10-09 DIAGNOSIS — F32A Depression, unspecified: Secondary | ICD-10-CM

## 2024-10-09 DIAGNOSIS — F419 Anxiety disorder, unspecified: Secondary | ICD-10-CM | POA: Insufficient documentation

## 2024-10-09 DIAGNOSIS — G4700 Insomnia, unspecified: Secondary | ICD-10-CM | POA: Insufficient documentation

## 2024-10-09 MED ORDER — TRAZODONE HCL 50 MG PO TABS: 50.0000 mg | ORAL_TABLET | Freq: Every evening | ORAL | 1 refills | Status: AC | PRN

## 2024-10-09 NOTE — Patient Instructions (Signed)
 Insomnia Insomnia is a sleep disorder that makes it difficult to fall asleep or stay asleep. Insomnia can cause fatigue, low energy, difficulty concentrating, mood swings, and poor performance at work or school. There are three different ways to classify insomnia: Difficulty falling asleep. Difficulty staying asleep. Waking up too early in the morning. Any type of insomnia can be long-term (chronic) or short-term (acute). Both are common. Short-term insomnia usually lasts for 3 months or less. Chronic insomnia occurs at least three times a week for longer than 3 months. What are the causes? Insomnia may be caused by another condition, situation, or substance, such as: Having certain mental health conditions, such as anxiety and depression. Using caffeine, alcohol, tobacco, or drugs. Having gastrointestinal conditions, such as gastroesophageal reflux disease (GERD). Having certain medical conditions. These include: Asthma. Alzheimer's disease. Stroke. Chronic pain. An overactive thyroid  gland (hyperthyroidism). Other sleep disorders, such as restless legs syndrome and sleep apnea. Menopause. Sometimes, the cause of insomnia may not be known. What increases the risk? Risk factors for insomnia include: Gender. Females are affected more often than males. Age. Insomnia is more common as people get older. Stress and certain medical and mental health conditions. Lack of exercise. Having an irregular work schedule. This may include working night shifts and traveling between different time zones. What are the signs or symptoms? If you have insomnia, the main symptom is having trouble falling asleep or having trouble staying asleep. This may lead to other symptoms, such as: Feeling tired or having low energy. Feeling nervous about going to sleep. Not feeling rested in the morning. Having trouble concentrating. Feeling irritable, anxious, or depressed. How is this diagnosed? This condition  may be diagnosed based on: Your symptoms and medical history. Your health care provider may ask about: Your sleep habits. Any medical conditions you have. Your mental health. A physical exam. How is this treated? Treatment for insomnia depends on the cause. Treatment may focus on treating an underlying condition that is causing the insomnia. Treatment may also include: Medicines to help you sleep. Counseling or therapy. Lifestyle adjustments to help you sleep better. Follow these instructions at home: Eating and drinking  Limit or avoid alcohol, caffeinated beverages, and products that contain nicotine and tobacco, especially close to bedtime. These can disrupt your sleep. Do not eat a large meal or eat spicy foods right before bedtime. This can lead to digestive discomfort that can make it hard for you to sleep. Sleep habits  Keep a sleep diary to help you and your health care provider figure out what could be causing your insomnia. Write down: When you sleep. When you wake up during the night. How well you sleep and how rested you feel the next day. Any side effects of medicines you are taking. What you eat and drink. Make your bedroom a dark, comfortable place where it is easy to fall asleep. Put up shades or blackout curtains to block light from outside. Use a white noise machine to block noise. Keep the temperature cool. Limit screen use before bedtime. This includes: Not watching TV. Not using your smartphone, tablet, or computer. Stick to a routine that includes going to bed and waking up at the same times every day and night. This can help you fall asleep faster. Consider making a quiet activity, such as reading, part of your nighttime routine. Try to avoid taking naps during the day so that you sleep better at night. Get out of bed if you are still awake after  15 minutes of trying to sleep. Keep the lights down, but try reading or doing a quiet activity. When you feel  sleepy, go back to bed. General instructions Take over-the-counter and prescription medicines only as told by your health care provider. Exercise regularly as told by your health care provider. However, avoid exercising in the hours right before bedtime. Use relaxation techniques to manage stress. Ask your health care provider to suggest some techniques that may work well for you. These may include: Breathing exercises. Routines to release muscle tension. Visualizing peaceful scenes. Make sure that you drive carefully. Do not drive if you feel very sleepy. Keep all follow-up visits. This is important. Contact a health care provider if: You are tired throughout the day. You have trouble in your daily routine due to sleepiness. You continue to have sleep problems, or your sleep problems get worse. Get help right away if: You have thoughts about hurting yourself or someone else. Get help right away if you feel like you may hurt yourself or others, or have thoughts about taking your own life. Go to your nearest emergency room or: Call 911. Call the National Suicide Prevention Lifeline at 515-625-9972 or 988. This is open 24 hours a day. Text the Crisis Text Line at (310) 725-1627. Summary Insomnia is a sleep disorder that makes it difficult to fall asleep or stay asleep. Insomnia can be long-term (chronic) or short-term (acute). Treatment for insomnia depends on the cause. Treatment may focus on treating an underlying condition that is causing the insomnia. Keep a sleep diary to help you and your health care provider figure out what could be causing your insomnia. This information is not intended to replace advice given to you by your health care provider. Make sure you discuss any questions you have with your health care provider. Document Revised: 08/18/2021 Document Reviewed: 08/18/2021 Elsevier Patient Education  2024 [employer].Trazodone  Tablets What is this medication? TRAZODONE  (TRAZ oh  done) treats depression. It increases the amount of serotonin in the brain, a substance that helps regulate mood. This medicine may be used for other purposes; ask your health care provider or pharmacist if you have questions. COMMON BRAND NAME(S): Desyrel  What should I tell my care team before I take this medication? They need to know if you have any of these conditions: Bipolar disorder Bleeding disorder Glaucoma Heart disease, or previous heart attack Irregular heartbeat or rhythm Kidney disease Liver disease Low levels of sodium in the blood Suicidal thoughts, plans, or attempt by you or a family member An unusual or allergic reaction to trazodone , other medications, foods, dyes, or preservatives Pregnant or trying to get pregnant Breastfeeding How should I use this medication? Take this medication by mouth with a glass of water. Take it as directed on the prescription label at the same time every day. Take this medication shortly after a meal or a light snack. Keep taking this medication unless your care team tells you to stop. Stopping it too quickly can cause serious side effects. It can also make your condition worse. A special MedGuide will be given to you by the pharmacist with each prescription and refill. Be sure to read this information carefully each time. Talk to your care team about the use of this medication in children. Special care may be needed. Overdosage: If you think you have taken too much of this medicine contact a poison control center or emergency room at once. NOTE: This medicine is only for you. Do not share this medicine  with others. What if I miss a dose? If you miss a dose, take it as soon as you can. If it is almost time for your next dose, take only that dose. Do not take double or extra doses. What may interact with this medication? Do not take this medication with any of the following: Certain medications for fungal infections, such as fluconazole ,  itraconazole, ketoconazole, posaconazole, voriconazole Cisapride Dronedarone Linezolid MAOIs, such as Carbex, Eldepryl, Marplan, Nardil, and Parnate Mesoridazine Methylene blue (injected into a vein) Pimozide Saquinavir Thioridazine This medication may also interact with the following: Alcohol Antiviral medications for HIV or AIDS Aspirin and aspirin-like medications Barbiturates, such as phenobarbital Certain medications for blood pressure, heart disease, irregular heart beat Certain medications for mental health conditions Certain medications for migraine headache, such as almotriptan, eletriptan, frovatriptan, naratriptan, rizatriptan, sumatriptan, zolmitriptan Certain medications for seizures, such as carbamazepine and phenytoin Certain medications for sleep Certain medications that treat or prevent blood clots, such as dalteparin, enoxaparin, warfarin Digoxin Fentanyl Lithium NSAIDS, medications for pain and inflammation, such as ibuprofen or naproxen Other medications that cause heart rhythm changes Rasagiline Supplements, such as St. John's wort, kava kava, valerian Tramadol Tryptophan This list may not describe all possible interactions. Give your health care provider a list of all the medicines, herbs, non-prescription drugs, or dietary supplements you use. Also tell them if you smoke, drink alcohol, or use illegal drugs. Some items may interact with your medicine. What should I watch for while using this medication? Visit your care team for regular checks on your progress. Tell your care team if your symptoms do not start to get better or if they get worse. Because it may take several weeks to see the full effects of this medication, it is important to continue your treatment as prescribed by your care team. Watch for new or worsening thoughts of suicide or depression. This includes sudden changes in mood, behaviors, or thoughts. These changes can happen at any time but are  more common in the beginning of treatment or after a change in dose. Call your care team right away if you experience these thoughts or worsening depression. This medication may cause mood and behavior changes, such as anxiety, nervousness, irritability, hostility, restlessness, excitability, hyperactivity, or trouble sleeping. These changes can happen at any time but are more common in the beginning of treatment or after a change in dose. Call your care team right away if you notice any of these symptoms. This medication may affect your coordination, reaction time, or judgment. Do not drive or operate machinery until you know how this medication affects you. Sit up or stand slowly to reduce the risk of dizzy or fainting spells. Drinking alcohol with this medication can increase the risk of these side effects. This medication may cause dry eyes and blurred vision. If you wear contact lenses you may feel some discomfort. Lubricating drops may help. See your care team if the problem does not go away or is severe. Your mouth may get dry. Chewing sugarless gum or sucking hard candy and drinking plenty of water may help. Contact your care team if the problem does not go away or is severe. What side effects may I notice from receiving this medication? Side effects that you should report to your care team as soon as possible: Allergic reactions--skin rash, itching, hives, swelling of the face, lips, tongue, or throat Bleeding--bloody or black, tar-like stools, red or dark brown urine, vomiting blood or brown material that looks like  coffee grounds, small, red or purple spots on skin, unusual bleeding or bruising Heart rhythm changes--fast or irregular heartbeat, dizziness, feeling faint or lightheaded, chest pain, trouble breathing Low blood pressure--dizziness, feeling faint or lightheaded, blurry vision Low sodium level--muscle weakness, fatigue, dizziness, headache, confusion Prolonged or painful  erection Serotonin syndrome--irritability, confusion, fast or irregular heartbeat, muscle stiffness, twitching muscles, sweating, high fever, seizures, chills, vomiting, diarrhea Sudden eye pain or change in vision such as blurry vision, seeing halos around lights, vision loss Thoughts of suicide or self-harm, worsening mood, feelings of depression Side effects that usually do not require medical attention (report to your care team if they continue or are bothersome): Change in sex drive or performance Constipation Dizziness Drowsiness Dry mouth This list may not describe all possible side effects. Call your doctor for medical advice about side effects. You may report side effects to FDA at 1-800-FDA-1088. Where should I keep my medication? Keep out of the reach of children and pets. Store at room temperature between 15 and 30 degrees C (59 to 86 degrees F). Protect from light. Keep container tightly closed. Throw away any unused medication after the expiration date. NOTE: This sheet is a summary. It may not cover all possible information. If you have questions about this medicine, talk to your doctor, pharmacist, or health care provider.  2025 Elsevier/Gold Standard (2023-11-24 00:00:00)
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
## 2024-10-19 ENCOUNTER — Telehealth: Payer: Self-pay | Admitting: Psychiatry

## 2024-10-19 NOTE — Telephone Encounter (Signed)
 You requested records from previous provider Dr. Vincente. Patient's ROI was returned to us  from the office stating that she has not been seen there since 2012 and there are no longer any records.

## 2024-10-19 NOTE — Telephone Encounter (Signed)
 Noted.

## 2024-11-01 ENCOUNTER — Ambulatory Visit: Admitting: Nurse Practitioner

## 2024-11-08 ENCOUNTER — Ambulatory Visit: Admitting: Nurse Practitioner

## 2024-11-13 ENCOUNTER — Ambulatory Visit

## 2024-11-23 ENCOUNTER — Ambulatory Visit: Admitting: Psychiatry
